# Patient Record
Sex: Male | Born: 1955 | Race: Black or African American | Hispanic: No | Marital: Married | Smoking: Never smoker
Health system: Southern US, Community
[De-identification: ages and names within clinical notes are randomized; demographics above are authoritative.]

## PROBLEM LIST (undated history)

## (undated) DIAGNOSIS — M1990 Unspecified osteoarthritis, unspecified site: Secondary | ICD-10-CM

## (undated) DIAGNOSIS — E119 Type 2 diabetes mellitus without complications: Secondary | ICD-10-CM

## (undated) DIAGNOSIS — R079 Chest pain, unspecified: Secondary | ICD-10-CM

## (undated) DIAGNOSIS — R110 Nausea: Secondary | ICD-10-CM

## (undated) DIAGNOSIS — E049 Nontoxic goiter, unspecified: Secondary | ICD-10-CM

## (undated) HISTORY — DX: Unspecified osteoarthritis, unspecified site: M19.90

## (undated) HISTORY — DX: Nausea: R11.0

## (undated) HISTORY — DX: Chest pain, unspecified: R07.9

---

## 1988-07-25 HISTORY — PX: THYROID SURGERY: SHX805

## 1996-07-25 HISTORY — PX: THYROID SURGERY: SHX805

## 2004-11-22 ENCOUNTER — Ambulatory Visit: Payer: Self-pay | Admitting: Nurse Practitioner

## 2004-11-22 ENCOUNTER — Ambulatory Visit: Payer: Self-pay | Admitting: *Deleted

## 2005-04-06 ENCOUNTER — Ambulatory Visit: Payer: Self-pay | Admitting: Internal Medicine

## 2005-07-12 ENCOUNTER — Ambulatory Visit: Payer: Self-pay | Admitting: Internal Medicine

## 2005-08-19 ENCOUNTER — Ambulatory Visit: Payer: Self-pay | Admitting: Family Medicine

## 2005-09-26 ENCOUNTER — Ambulatory Visit: Payer: Self-pay | Admitting: Internal Medicine

## 2005-11-17 ENCOUNTER — Ambulatory Visit: Payer: Self-pay | Admitting: Internal Medicine

## 2007-06-12 ENCOUNTER — Ambulatory Visit: Payer: Self-pay | Admitting: Family Medicine

## 2007-06-12 ENCOUNTER — Encounter (INDEPENDENT_AMBULATORY_CARE_PROVIDER_SITE_OTHER): Payer: Self-pay | Admitting: Nurse Practitioner

## 2007-06-12 LAB — CONVERTED CEMR LAB
BUN: 15 mg/dL (ref 6–23)
CO2: 24 meq/L (ref 19–32)
Calcium: 9.4 mg/dL (ref 8.4–10.5)
Chloride: 103 meq/L (ref 96–112)
Creatinine, Ser: 0.95 mg/dL (ref 0.40–1.50)

## 2007-06-15 ENCOUNTER — Ambulatory Visit (HOSPITAL_COMMUNITY): Admission: RE | Admit: 2007-06-15 | Discharge: 2007-06-15 | Payer: Self-pay | Admitting: Internal Medicine

## 2008-04-18 ENCOUNTER — Ambulatory Visit: Payer: Self-pay | Admitting: Internal Medicine

## 2009-02-22 ENCOUNTER — Encounter (INDEPENDENT_AMBULATORY_CARE_PROVIDER_SITE_OTHER): Payer: Self-pay | Admitting: Surgery

## 2009-02-22 ENCOUNTER — Inpatient Hospital Stay (HOSPITAL_COMMUNITY): Admission: EM | Admit: 2009-02-22 | Discharge: 2009-02-28 | Payer: Self-pay | Admitting: Emergency Medicine

## 2009-11-26 ENCOUNTER — Emergency Department (HOSPITAL_COMMUNITY): Admission: EM | Admit: 2009-11-26 | Discharge: 2009-11-26 | Payer: Self-pay | Admitting: Emergency Medicine

## 2010-10-31 ENCOUNTER — Inpatient Hospital Stay (INDEPENDENT_AMBULATORY_CARE_PROVIDER_SITE_OTHER)
Admission: RE | Admit: 2010-10-31 | Discharge: 2010-10-31 | Disposition: A | Discharge status: Home / Self Care | Payer: Medicaid Other | Source: Ambulatory Visit | Attending: Emergency Medicine | Admitting: Emergency Medicine

## 2010-10-31 DIAGNOSIS — R197 Diarrhea, unspecified: Secondary | ICD-10-CM

## 2010-10-31 LAB — COMPREHENSIVE METABOLIC PANEL
AST: 19 U/L (ref 0–37)
Alkaline Phosphatase: 70 U/L (ref 39–117)
CO2: 25 mEq/L (ref 19–32)
Glucose, Bld: 133 mg/dL — ABNORMAL HIGH (ref 70–99)
Total Protein: 7.5 g/dL (ref 6.0–8.3)

## 2010-10-31 LAB — POCT CARDIAC MARKERS: Myoglobin, poc: 83.9 ng/mL (ref 12–200)

## 2010-10-31 LAB — POCT URINALYSIS DIP (DEVICE)
Glucose, UA: NEGATIVE mg/dL
Hgb urine dipstick: NEGATIVE
Protein, ur: NEGATIVE mg/dL
Specific Gravity, Urine: 1.025 (ref 1.005–1.030)
Urobilinogen, UA: 0.2 mg/dL (ref 0.0–1.0)

## 2010-10-31 LAB — DIFFERENTIAL
Eosinophils Relative: 0 % (ref 0–5)
Lymphocytes Relative: 3 % — ABNORMAL LOW (ref 12–46)
Lymphs Abs: 0.3 10*3/uL — ABNORMAL LOW (ref 0.7–4.0)
Monocytes Absolute: 0.1 10*3/uL (ref 0.1–1.0)
Monocytes Relative: 1 % — ABNORMAL LOW (ref 3–12)
Neutrophils Relative %: 96 % — ABNORMAL HIGH (ref 43–77)

## 2010-10-31 LAB — CBC
HCT: 44.6 % (ref 39.0–52.0)
Hemoglobin: 15 g/dL (ref 13.0–17.0)
MCHC: 33.6 g/dL (ref 30.0–36.0)
MCV: 83.5 fL (ref 78.0–100.0)
RDW: 14.2 % (ref 11.5–15.5)
RDW: 14.7 % (ref 11.5–15.5)
WBC: 7.2 10*3/uL (ref 4.0–10.5)
WBC: 9.2 10*3/uL (ref 4.0–10.5)

## 2010-10-31 LAB — LACTIC ACID, PLASMA: Lactic Acid, Venous: 2.7 mmol/L — ABNORMAL HIGH (ref 0.5–2.2)

## 2010-10-31 LAB — PROTIME-INR
INR: 1.1 (ref 0.00–1.49)
Prothrombin Time: 14 seconds (ref 11.6–15.2)

## 2010-10-31 LAB — CULTURE, BLOOD (ROUTINE X 2)

## 2010-12-07 NOTE — Op Note (Signed)
Samuel [name], Samuel [name]                ACCOUNT NO.:  [id number]   MEDICAL RECORD NO.:  [id number]          PATIENT TYPE:  INP   LOCATION:  1826                         FACILITY:  [facility]   PHYSICIAN:  Thomas A. Cornett, M.D.DATE OF BIRTH:  Jan 17, 1956   DATE OF PROCEDURE:  02/22/2009  DATE OF DISCHARGE:                               OPERATIVE REPORT   PREOPERATIVE DIAGNOSIS:  Acute appendicitis.   POSTOPERATIVE DIAGNOSIS:  Perforated appendicitis with periappendiceal  abscess.   PROCEDURE:  Laparoscopic appendectomy.   SURGEON:  Maisie Fus A. Cornett, MD   ANESTHESIA:  General endotracheal anesthesia with 0.25% Sensorcaine  local.   ESTIMATED BLOOD LOSS:  30 mL.   SPECIMEN:  Appendiceal remnants to Pathology.   DRAIN:  19-Blake drain to right lower quadrant.   INDICATIONS FOR PROCEDURE:  The patient is a 55 year old male with a 2-  day history of abdominal pain.  CT scan showed appendicitis and he was  brought to the operating room for laparoscopic appendectomy.  There was  no evidence of perforation by CT scan or physical examination.   DESCRIPTION OF PROCEDURE:  The patient was brought to the operating room  and placed supine.  After induction of general anesthesia, the abdomen  was prepped and draped in a sterile fashion and Foley catheter was  placed.  A 1 cm supraumbilical incision was made.  Dissection was  carried down to fascia and the fascia was opened in the midline.  Pursestring suture of 0 Vicryl was placed and a 12-mm Hasson cannula was  placed under direct vision.  Pneumoperitoneum was created to 15 mmHg of  CO2 and a laparoscope was placed.  Upon examination, the appendix was in  the retrocecal position.  Two 5-mm ports were placed, one in the midline  just below the umbilicus and a second in the left lower quadrant.  The  appendix was identified in the tip.  The appendix had already blown out.  There was a large abscess associated with it.  There was not much that  of appendix left, but I was able to dissect around the base of the  appendix to get around this.  Using a 5-mm scope, I placed a GIA-45  stapling device rod across the base of what was left of the appendix and  a stapled across this.  I was then able to irrigate out the abscess  cavity and posterior wall of the colon, but there really was not much  more appendix I could get out.  There was a little bit of excess tissue  on either side of the staple line that I trimmed off and this was the  remainder of the appendix and I passed this off the field as specimen.  A drain was placed in the abscess cavity in the right lower quadrant  after irrigation was clear.  The drain was brought out through the lower  abdominal laparoscopic port site and secured the skin with 3-0 nylon.  This was placed to a bulb suction.  I then finished irrigating and  suctioning out all excess irrigation.  No evidence of  injury to small or  large bowel or other organs.  We then removed our ports under direct  vision and passed them off the field.  The umbilical port site was  closed with a pursestring 0 Vicryl  suture and 4-0 Monocryl was used to close the remainder of skin  incisions with Dermabond as a dressing.  All final counts of sponge,  needle, and instrument were found to be correct at this portion of the  case.  The patient was then awakened and taken to the recovery room in  satisfactory condition.      Thomas A. Cornett, M.D.  Electronically Signed     TAC/MEDQ  D:  02/22/2009  T:  02/23/2009  Job:  [id number]

## 2010-12-07 NOTE — Consult Note (Signed)
NAMEMANOAH, Samuel [name]                ACCOUNT NO.:  [id number]   MEDICAL RECORD NO.:  [id number]          PATIENT TYPE:  EMS   LOCATION:  MAJO                         FACILITY:  [facility]   PHYSICIAN:  Thomas A. Cornett, M.D.DATE OF BIRTH:  02/07/1956   DATE OF CONSULTATION:  02/22/2009  DATE OF DISCHARGE:                                 CONSULTATION   REQUESTING PHYSICIAN:  Dione Booze, MD   REASON FOR CONSULTATION:  Abdominal pain.   HISTORY OF PRESENT ILLNESS:  The patient is a 55 year old male with a 2-  day history of abdominal pain.  The pain started two days ago.  It is  located in his right lower quadrant.  The intensity is 6/10.  It is  crampy in nature without radiation.  It is made worse with movement and  made better lying still.  Currently, the pain is 0/10 with pain  medicine.  There is no associated nausea or vomiting.  There is low-  grade fever.  No back pain or dysuria.  CT scan was obtained which  showed acute appendicitis.  I was asked to see the patient at the  request of Dr. Preston Fleeting in consultation.   PAST MEDICAL HISTORY:  None.   PAST SURGICAL HISTORY:  None.   MEDICATIONS:  None.   ALLERGIES:  None.   SOCIAL HISTORY:  He is married.  He moved here from [place] 4 years ago  according to his wife.  His wife and son are along and they actually  help to translate some since his English is broken.  He denies tobacco  or alcohol use.   FAMILY HISTORY:  Noncontributory.   REVIEW OF SYSTEMS:  Positive for right lower quadrant pain.  Denies  nausea, vomiting, or chills.  Positive for fever.  Otherwise, 15-point  review of systems is negative.   PHYSICAL EXAMINATION:  VITAL SIGNS:  Temperature 101.2, heart rate 107,  blood pressure 90/48, and respirations 20.  GENERAL APPEARANCE:  Pleasant male in no apparent distress.  HEENT:  Extraocular movements are intact.  No evidence of jaundice.  Oropharynx is clear.  NECK:  Supple and nontender.  Trachea is midline.  No  adenopathy.  PULMONARY:  Lung sounds are clear bilaterally.  Chest wall motion is  normal.  CARDIOVASCULAR:  Regular rate rhythm without rub, murmur, or gallop.  EXTREMITIES:  Warm and well-perfused.  ABDOMEN:  Tender in right lower quadrant with positive rebound and  guarding.  No mass or hernia.  EXTREMITIES:  Muscle tone is normal.  Range of motion is normal.  NEUROLOGIC:  Glasgow coma scale is 15.  Motor and sensory function is  grossly intact.   DIAGNOSTIC STUDIES:  His white count is 9200, hemoglobin 15, and  platelet count 240,000.  Sodium 135, potassium 3.3, chloride 101, CO2  25, BUN 10, creatinine 0.96, and glucose 137.  An abdominal pelvic CT  showed acute appendicitis.  INR is 1.1.  Lactate is 2.7.  Lipase is 15.  His cardiac panel is within normal limits showing normal troponin I  which is of less than 0.05 and a  normal CK-MB.   IMPRESSION:  Acute appendicitis.   PLAN:  Recommend laparoscopic appendectomy to Samuel [name].  Alternatives  to therapy include IV antibiotics, but I think given the fact that this  is a really not perforated and he is young and he is relatively healthy,  I feel a surgical approach will be better and much faster for him to get  out of the hospital quicker.  Risk of bleeding, infection, and further  surgery were described.  Risk of surgery including bleeding, infection,  organ injury, injury to the abdominal wall, arteries, veins, and nerves.  Also the need for more surgery and prolonged hospitalization and  anesthesia risks.      Thomas A. Cornett, M.D.  Electronically Signed     TAC/MEDQ  D:  02/22/2009  T:  02/22/2009  Job:  [id number]   cc:   Dione Booze, MD

## 2010-12-10 NOTE — Discharge Summary (Signed)
Samuel [name], [name] NO.:  [id number]   MEDICAL RECORD NO.:  [id number]          PATIENT TYPE:  INP   LOCATION:  5159                         FACILITY:  [facility]   PHYSICIAN:  Gaynelle Adu, MD        DATE OF BIRTH:  1955-11-17   DATE OF ADMISSION:  02/22/2009  DATE OF DISCHARGE:  02/28/2009                               DISCHARGE SUMMARY   ATTENDING PHYSICIAN:  Maisie Fus A. Cornett, MD   DISCHARGING PHYSICIAN:  Gaynelle Adu, MD   CONSULTANTS:  None.   PROCEDURE:  Laparoscopic appendectomy with placement of a 19-French  Blake drain by Dr. Luisa Hart on February 22, 2009.   REASON FOR ADMISSION:  This is a 55 year old male with a 2-day history  of abdominal pain.  It is located in his right lower quadrant and is  crampy in nature without radiation.  The pain is worse with movement and  made better by lying still.  He has not had any associated nausea or  vomiting and has had a low-grade fever.  Because of the pain, the  patient came to the emergency department where he was found to have  acute appendicitis by CT scan.  Please see admitting history and  physical for further details.   ADMITTING DIAGNOSIS:  Acute appendicitis.   HOSPITAL COURSE:  The patient was admitted and placed on antibiotics.  He was then taken to the operating room where a laparoscopic  appendectomy was completed; however, the patient was found to have a  perforated appendix with an abscess.  After the procedure, a 19-French  Blake drain was left in place.  On postoperative day 1, the patient was  feeling better with no nausea or vomiting but had an increase in fevers  up to 102, and blood cultures were obtained.  By postoperative day 2,  the patient was feeling much better and passing flatus and tolerating a  regular diet; however, he was still febrile and wanted to stay for an  extra day for precautionary reasons.  On postoperative day 3, the  patient's blood cultures had returned and it was found  that the patient  had E. coli bacteremia.  Therefore, his Levoxine was discontinued and it  was changed to [employer].  At this time, the patient continued to spike  fevers despite feeling well with a soft, appropriately tender abdomen  and with only serous output from his JP drain.  His antibiotics were  then eventually changed again to Rocephin per the pharmacy.  By  postoperative day 6, the patient had essentially been afebrile for 24  hours, was having normal bowel movements and eating well with only  intermittent pain.  At this time, his JP drain was discontinued as there  was only serous output, and he was otherwise ready for discharge home.   DISCHARGE DIAGNOSES:  1. Acute perforated appendicitis.  2. Status post laparoscopic appendectomy with a JP drain that was      removed prior to discharge.  3. Escherichia coli bacteremia.   DISCHARGE MEDICATIONS:  1. Percocet 5/325 one to two p.o. q.4  h. p.r.n. pain.  2. Ceftin 500 mg b.i.d. x7 days.  Otherwise, he had no home      medications.   DISCHARGE INSTRUCTIONS:  The patient may resume a regular diet.  He may  increase his activity slowly and walk up steps.  He may shower; however,  he is not to take a bath for the next 2 weeks.  He is not to do any  heavy lifting greater than approximately 15 pounds for the next 2 weeks.  He is otherwise to return to the [facility] at our office on March 17, 2009, for a followup visit.  He is to call our office for fever greater  than 101.5 or worsening abdominal pain.      Letha Cape, PA      Gaynelle Adu, MD  Electronically Signed    KEO/MEDQ  D:  03/20/2009  T:  03/21/2009  Job:  [id number]   cc:   Maisie Fus A. Cornett, M.D.

## 2012-03-02 ENCOUNTER — Encounter (HOSPITAL_COMMUNITY): Payer: Self-pay | Admitting: Emergency Medicine

## 2012-03-02 ENCOUNTER — Emergency Department (HOSPITAL_COMMUNITY): Payer: Self-pay

## 2012-03-02 ENCOUNTER — Emergency Department (HOSPITAL_COMMUNITY)
Admission: EM | Admit: 2012-03-02 | Discharge: 2012-03-02 | Disposition: A | Discharge status: Home / Self Care | Payer: Self-pay | Attending: Emergency Medicine | Admitting: Emergency Medicine

## 2012-03-02 DIAGNOSIS — N509 Disorder of male genital organs, unspecified: Secondary | ICD-10-CM | POA: Insufficient documentation

## 2012-03-02 DIAGNOSIS — N201 Calculus of ureter: Secondary | ICD-10-CM | POA: Insufficient documentation

## 2012-03-02 LAB — URINALYSIS, ROUTINE W REFLEX MICROSCOPIC
Bilirubin Urine: NEGATIVE
Glucose, UA: NEGATIVE mg/dL
Leukocytes, UA: NEGATIVE
Nitrite: NEGATIVE
Urobilinogen, UA: 0.2 mg/dL (ref 0.0–1.0)

## 2012-03-02 LAB — BASIC METABOLIC PANEL
BUN: 12 mg/dL (ref 6–23)
CO2: 25 mEq/L (ref 19–32)
Calcium: 9.3 mg/dL (ref 8.4–10.5)
Chloride: 103 mEq/L (ref 96–112)
Creatinine, Ser: 0.91 mg/dL (ref 0.50–1.35)
Glucose, Bld: 159 mg/dL — ABNORMAL HIGH (ref 70–99)
Sodium: 138 mEq/L (ref 135–145)

## 2012-03-02 LAB — CBC
HCT: 44.7 % (ref 39.0–52.0)
MCH: 27.5 pg (ref 26.0–34.0)
Platelets: 248 10*3/uL (ref 150–400)
RBC: 5.45 MIL/uL (ref 4.22–5.81)

## 2012-03-02 LAB — URINE MICROSCOPIC-ADD ON

## 2012-03-02 MED ORDER — ONDANSETRON 8 MG PO TBDP: 8.0000 mg | ORAL_TABLET | Freq: Three times a day (TID) | ORAL | Status: AC | PRN

## 2012-03-02 MED ORDER — OXYCODONE-ACETAMINOPHEN 5-325 MG PO TABS: 1.0000 | ORAL_TABLET | Freq: Four times a day (QID) | ORAL | Status: AC | PRN

## 2012-03-02 MED ORDER — HYDROMORPHONE HCL PF 1 MG/ML IJ SOLN
1.0000 mg | Freq: Once | INTRAMUSCULAR | Status: AC
  Administered 2012-03-02: 1 mg via INTRAVENOUS
  Filled 2012-03-02: qty 1

## 2012-03-02 MED ORDER — ONDANSETRON HCL 4 MG/2ML IJ SOLN
4.0000 mg | Freq: Once | INTRAMUSCULAR | Status: AC
  Administered 2012-03-02: 4 mg via INTRAVENOUS
  Filled 2012-03-02: qty 2

## 2012-03-02 MED ORDER — SODIUM CHLORIDE 0.9 % IV SOLN
INTRAVENOUS | Status: DC
  Administered 2012-03-02: 14:00:00 via INTRAVENOUS

## 2012-03-02 NOTE — ED Notes (Signed)
Pt c/o bilateral testicle pain and trouble voiding starting today; pt denies any swelling

## 2012-03-02 NOTE — ED Notes (Signed)
Pt knows we need a urine sample and has a urinal at bedside

## 2012-03-02 NOTE — ED Notes (Signed)
Pt to CT scan.

## 2012-03-02 NOTE — ED Notes (Signed)
Pt presents with onset of testicular pain x 2 days that suddenly worsened today after having sexual intercourse with his wife.  Pt denied any pain during intercourse, reports immediately after, he went to void with onset of pain.  Pt denies any swelling to testicles, reports pain radiates into bilateral lower abdomen.  +nausea and vomiting

## 2012-03-02 NOTE — ED Provider Notes (Signed)
History     CSN: [id number]  Arrival date & time 03/02/12  1131   First MD Initiated Contact with Patient 03/02/12 1324      Chief Complaint  Patient presents with  . Groin Pain  . Testicle Pain    HPI Comments: The pain is in both testicles.  He has not taken any medications this morning.    Patient is a 56 y.o. male presenting with groin pain. The history is provided by the patient.  Groin Pain This is a new problem. Episode onset: this morning. The problem occurs constantly. The problem has not changed since onset.Associated symptoms comments: Urinary uregency. Exacerbated by: pt noticed it after having intercourse with his wife today. Nothing relieves the symptoms.   patient states the pain is severe in starts in his suprapubic region and radiates down to his testicles. He does have some urinary frequency. Initially felt the pain he felt like his testicles were going to fall off.  History reviewed. No pertinent past medical history.  History reviewed. No pertinent past surgical history.  History reviewed. No pertinent family history.  History  Substance Use Topics  . Smoking status: Never Smoker   . Smokeless tobacco: Not on file  . Alcohol Use: No      Review of Systems  Constitutional: Negative for fever.  Respiratory: Negative for cough.   Gastrointestinal: Positive for nausea and vomiting (once ).  Genitourinary: Positive for testicular pain.    Allergies  Review of patient's allergies indicates no known allergies.  Home Medications  No current outpatient prescriptions on file.  BP 153/73  Pulse 58  Temp 98.2 F (36.8 C) (Oral)  Resp 16  SpO2 97%  Physical Exam  Nursing note and vitals reviewed. Constitutional: He appears well-developed and well-nourished. No distress.  HENT:  Head: Normocephalic and atraumatic.  Right Ear: External ear normal.  Left Ear: External ear normal.  Eyes: Conjunctivae are normal. Right eye exhibits no discharge. Left  eye exhibits no discharge. No scleral icterus.  Neck: Neck supple. No tracheal deviation present.  Cardiovascular: Normal rate, regular rhythm and intact distal pulses.   Pulmonary/Chest: Effort normal and breath sounds normal. No stridor. No respiratory distress. He has no wheezes. He has no rales.  Abdominal: Soft. Bowel sounds are normal. He exhibits no distension. There is no tenderness. There is no rebound and no guarding. Hernia confirmed negative in the right inguinal area and confirmed negative in the left inguinal area.  Genitourinary: Testes normal and penis normal. Right testis shows no mass, no swelling and no tenderness. Left testis shows no mass, no swelling and no tenderness.  Musculoskeletal: He exhibits no edema and no tenderness.  Neurological: He is alert. He has normal strength. No sensory deficit. Cranial nerve deficit:  no gross defecits noted. He exhibits normal muscle tone. He displays no seizure activity. Coordination normal.  Skin: Skin is warm and dry. No rash noted.  Psychiatric: He has a normal mood and affect.    ED Course  Procedures (including critical care time)  Medications  0.9 %  sodium chloride infusion (  Intravenous New Bag/Given 03/02/12 1404)  oxyCODONE-acetaminophen (PERCOCET/ROXICET) 5-325 MG per tablet (not administered)  ondansetron (ZOFRAN ODT) 8 MG disintegrating tablet (not administered)  ondansetron (ZOFRAN) injection 4 mg (4 mg Intravenous Given 03/02/12 1404)  HYDROmorphone (DILAUDID) injection 1 mg (1 mg Intravenous Given 03/02/12 1407)    Labs Reviewed - No data to display US Scrotum  03/02/2012  *RADIOLOGY REPORT*  Clinical  Data:  Scrotal/groin pain  SCROTAL ULTRASOUND DOPPLER ULTRASOUND OF THE TESTICLES  Technique: Complete ultrasound examination of the testicles, epididymis, and other scrotal structures was performed.  Color and spectral Doppler ultrasound were also utilized to evaluate blood flow to the testicles.  Comparison:  None   Findings:  Right testis:  Normal in size and appearance, measuring 4.2 x 2.0 x 2.9 cm.  Left testis:  Normal in size and appearance, measuring 4.6 x 2.0 x 2.9 cm.  Right epididymis:  Normal in size and appearance.  Left epididymis:  Normal in size and appearance.  Hydrocele:  Small bilateral hydroceles.  Varicocele:  Absent.  Pulsed Doppler interrogation of both testes demonstrates low resistance flow bilaterally.  IMPRESSION: No evidence of testicular torsion.  Small bilateral hydroceles.  Original Report Authenticated By: Charline Bills, M.D.   [place] Art/ven Flow Abd Pelv Doppler  03/02/2012  *RADIOLOGY REPORT*  Clinical Data:  Scrotal/groin pain  SCROTAL ULTRASOUND DOPPLER ULTRASOUND OF THE TESTICLES  Technique: Complete ultrasound examination of the testicles, epididymis, and other scrotal structures was performed.  Color and spectral Doppler ultrasound were also utilized to evaluate blood flow to the testicles.  Comparison:  None  Findings:  Right testis:  Normal in size and appearance, measuring 4.2 x 2.0 x 2.9 cm.  Left testis:  Normal in size and appearance, measuring 4.6 x 2.0 x 2.9 cm.  Right epididymis:  Normal in size and appearance.  Left epididymis:  Normal in size and appearance.  Hydrocele:  Small bilateral hydroceles.  Varicocele:  Absent.  Pulsed Doppler interrogation of both testes demonstrates low resistance flow bilaterally.  IMPRESSION: No evidence of testicular torsion.  Small bilateral hydroceles.  Original Report Authenticated By: Charline Bills, M.D.     1. Right ureteral stone       MDM  Pt with a right ureteral stone as the source of his pain. His symptoms improved with treatment in emergency department. He'll be discharged home with a referral to allergy to be used as needed. Pain medications and antinausea medications have been prescribed        Celene Kras, MD 03/02/12 [phone]

## 2012-03-03 NOTE — ED Notes (Signed)
Pt/pharmacy states zofran too expensive; med changed to phenergan 25mg  po q6h prn, dispense 20 no refills per dr Rubin Payor; called to [facility] at [phone].

## 2012-07-25 HISTORY — PX: APPENDECTOMY: SHX54

## 2012-08-01 ENCOUNTER — Encounter (HOSPITAL_COMMUNITY): Payer: Self-pay | Admitting: *Deleted

## 2012-08-01 ENCOUNTER — Emergency Department (HOSPITAL_COMMUNITY)
Admission: EM | Admit: 2012-08-01 | Discharge: 2012-08-01 | Disposition: A | Discharge status: Home / Self Care | Payer: Self-pay | Attending: Emergency Medicine | Admitting: Emergency Medicine

## 2012-08-01 ENCOUNTER — Emergency Department (HOSPITAL_COMMUNITY): Payer: Self-pay

## 2012-08-01 DIAGNOSIS — R0789 Other chest pain: Secondary | ICD-10-CM | POA: Insufficient documentation

## 2012-08-01 DIAGNOSIS — R1013 Epigastric pain: Secondary | ICD-10-CM | POA: Insufficient documentation

## 2012-08-01 DIAGNOSIS — J029 Acute pharyngitis, unspecified: Secondary | ICD-10-CM | POA: Insufficient documentation

## 2012-08-01 DIAGNOSIS — B349 Viral infection, unspecified: Secondary | ICD-10-CM

## 2012-08-01 DIAGNOSIS — L299 Pruritus, unspecified: Secondary | ICD-10-CM | POA: Insufficient documentation

## 2012-08-01 DIAGNOSIS — B9789 Other viral agents as the cause of diseases classified elsewhere: Secondary | ICD-10-CM | POA: Insufficient documentation

## 2012-08-01 DIAGNOSIS — R509 Fever, unspecified: Secondary | ICD-10-CM | POA: Insufficient documentation

## 2012-08-01 LAB — POCT I-STAT, CHEM 8
BUN: 19 mg/dL (ref 6–23)
Calcium, Ion: 1.17 mmol/L (ref 1.12–1.23)
Chloride: 103 mEq/L (ref 96–112)
Glucose, Bld: 136 mg/dL — ABNORMAL HIGH (ref 70–99)

## 2012-08-01 LAB — CBC
Platelets: 271 10*3/uL (ref 150–400)
RDW: 13.9 % (ref 11.5–15.5)
WBC: 6.3 10*3/uL (ref 4.0–10.5)

## 2012-08-01 LAB — COMPREHENSIVE METABOLIC PANEL
AST: 22 U/L (ref 0–37)
Albumin: 3.8 g/dL (ref 3.5–5.2)
Alkaline Phosphatase: 73 U/L (ref 39–117)
Chloride: 102 mEq/L (ref 96–112)
Potassium: 4 mEq/L (ref 3.5–5.1)
Total Bilirubin: 0.7 mg/dL (ref 0.3–1.2)

## 2012-08-01 LAB — POCT I-STAT TROPONIN I: Troponin i, poc: 0 ng/mL (ref 0.00–0.08)

## 2012-08-01 MED ORDER — PREDNISONE 20 MG PO TABS
40.0000 mg | ORAL_TABLET | Freq: Once | ORAL | Status: AC
  Administered 2012-08-01: 40 mg via ORAL
  Filled 2012-08-01: qty 2

## 2012-08-01 MED ORDER — OMEPRAZOLE 20 MG PO CPDR: 20.0000 mg | DELAYED_RELEASE_CAPSULE | Freq: Every day | ORAL | Status: DC

## 2012-08-01 MED ORDER — DIPHENHYDRAMINE HCL 25 MG PO CAPS
25.0000 mg | ORAL_CAPSULE | Freq: Once | ORAL | Status: AC
  Administered 2012-08-01: 25 mg via ORAL
  Filled 2012-08-01: qty 1

## 2012-08-01 MED ORDER — GI COCKTAIL ~~LOC~~
30.0000 mL | Freq: Once | ORAL | Status: AC
  Administered 2012-08-01: 30 mL via ORAL
  Filled 2012-08-01: qty 30

## 2012-08-01 MED ORDER — SODIUM CHLORIDE 0.9 % IV BOLUS (SEPSIS): 1000.0000 mL | Freq: Once | INTRAVENOUS | Status: DC

## 2012-08-01 NOTE — ED Provider Notes (Signed)
History   This chart was scribed for non-physician practitioner working with Juliet Rude. Rubin Payor, MD by Gerlean Ren, ED Scribe. This patient was seen in room TR06C/TR06C and the patient's care was started at 10:03 PM.    CSN: [id number]  Arrival date & time 08/01/12  2132   First MD Initiated Contact with Patient 08/01/12 2149      Chief Complaint  Patient presents with  . Pruritis     The history is provided by the patient. No language interpreter was used.   Samuel [name] is a 57 y.o. male who presents to the Emergency Department complaining of constant itching all over body since being discharged here earlier today after presenting with chest pain and having chest pain workup with a GI cocktail.  Pt states chest pain is still present rated as 9/10.  Pt denies tobacco and alcohol use.   Patient's pain was relieved with the GI cocktail and fluids earlier today.  History reviewed. No pertinent past medical history.  History reviewed. No pertinent past surgical history.  History reviewed. No pertinent family history.  History  Substance Use Topics  . Smoking status: Never Smoker   . Smokeless tobacco: Not on file  . Alcohol Use: No      Review of Systems  Constitutional:       General Pruritus  Cardiovascular: Positive for chest pain.    Allergies  Review of patient's allergies indicates no known allergies.  Home Medications  No current outpatient prescriptions on file.  BP 120/74  Pulse 107  Temp 98 F (36.7 C) (Oral)  Resp 18  SpO2 98%  Physical Exam  Nursing note and vitals reviewed. Constitutional: He is oriented to person, place, and time. He appears well-developed and well-nourished. No distress.  HENT:  Head: Normocephalic and atraumatic.       Goiter present, airway intact  Eyes: EOM are normal.  Neck: Neck supple. No tracheal deviation present.  Cardiovascular: Normal rate, regular rhythm and normal heart sounds.  Exam reveals no gallop and no  friction rub.   No murmur heard. Pulmonary/Chest: Effort normal and breath sounds normal. No respiratory distress.  Musculoskeletal: Normal range of motion.  Neurological: He is alert and oriented to person, place, and time.  Skin: Skin is warm and dry. No rash noted.  Psychiatric: He has a normal mood and affect. His behavior is normal.    ED Course  Procedures (including critical care time) DIAGNOSTIC STUDIES: Oxygen Saturation is 98% on room air, normal by my interpretation.    COORDINATION OF CARE: 10:06 PM- Patient informed of clinical course, understands medical decision-making process, and agrees with plan.  Results for orders placed during the hospital encounter of 08/01/12  POCT I-STAT, CHEM 8      Component Value Range   Sodium 141  135 - 145 mEq/L   Potassium 4.3  3.5 - 5.1 mEq/L   Chloride 103  96 - 112 mEq/L   BUN 19  6 - 23 mg/dL   Creatinine, Ser 6.57  0.50 - 1.35 mg/dL   Glucose, Bld 846 (*) 70 - 99 mg/dL   Calcium, Ion 9.62  9.52 - 1.23 mmol/L   TCO2 31  0 - 100 mmol/L   Hemoglobin 17.0  13.0 - 17.0 g/dL   HCT 84.1  32.4 - 40.1 %  POCT I-STAT TROPONIN I      Component Value Range   Troponin i, poc 0.00  0.00 - 0.08 ng/mL   Comment 3  Dg Chest 2 View  08/01/2012  *RADIOLOGY REPORT*  Clinical Data: Cough and chest pain.  CHEST - 2 VIEW  Comparison: None.  Findings: Lungs are clear.  Heart size is normal.  No pneumothorax or pleural fluid.  IMPRESSION: No acute disease.   Original Report Authenticated By: Holley Dexter, M.D.       1. Pruritus       MDM  This is a 57 year old male, who presents emergency department with chief complaint. This. Patient was seen here earlier today for chest pain, and had a negative cardiac workup. I have discussed this patient with Dr. Rubin Payor, and will order an additional troponin and chest x-ray and EKG.  11:48 PM Patient is improved and no longer itching.  Repeated workup is negative for cardiac in  etiology, will treat the patient's pruritus with prednisone and Benadryl. The patient is stable and ready for discharge. Return precautions have been given. Patient understands and agrees with the plan.  I personally performed the services described in this documentation, which was scribed in my presence. The recorded information has been reviewed and is accurate.  Filed Vitals:   08/01/12 2349  BP: 108/70  Pulse: 91  Temp: 97.8 F (36.6 C)  Resp: [address], [place] 08/01/12 2353

## 2012-08-01 NOTE — ED Notes (Signed)
Pt states he was seen here earlier for CP.  Came back due to itching all over body.

## 2012-08-01 NOTE — ED Notes (Signed)
Reports having a sore throat, then went to intermittnet chest pains and now into his abd. Having nausea, no vomiting or diarrhea. No acute distress noted at triage.

## 2012-08-01 NOTE — ED Provider Notes (Signed)
History     CSN: [id number]  Arrival date & time 08/01/12  1310   First MD Initiated Contact with Patient 08/01/12 1356      Chief Complaint  Patient presents with  . Chest Pain  . Sore Throat  . Abdominal Pain    (Consider location/radiation/quality/duration/timing/severity/associated sxs/prior treatment) HPI Complains of sore throat, chest pain, and epigastric pain onset 3 days ago constant. Unchanging. Pain is moderate not made better or worse by anything. Nonexertional. Associated symptoms include slight cough, subjective fever, lightheadedness with standing and diarrhea 2 days ago which has since resolved. No treatment prior to coming here no other complaint History reviewed. No pertinent past medical history. Past medical history goiter History reviewed. No pertinent past surgical history. Surgical history appendectomy, neck surgery History reviewed. No pertinent family history.  History  Substance Use Topics  . Smoking status: Never Smoker   . Smokeless tobacco: Not on file  . Alcohol Use: No      Review of Systems  Constitutional: Positive for fever.  HENT: Positive for sore throat.   Cardiovascular: Positive for chest pain.  Gastrointestinal: Positive for abdominal pain.    Allergies  Review of patient's allergies indicates no known allergies.  Home Medications  No current outpatient prescriptions on file.  BP 125/76  Pulse 91  Temp 99.5 F (37.5 C) (Oral)  Resp 20  SpO2 97%  Physical Exam  Nursing note and vitals reviewed. Constitutional: He appears well-developed and well-nourished. No distress.  HENT:  Head: Normocephalic and atraumatic.       Mucous membranes dry  Eyes: Conjunctivae normal are normal. Pupils are equal, round, and reactive to light.  Neck: Neck supple. No tracheal deviation present. No thyromegaly present.       Goiter present  Cardiovascular: Normal rate and regular rhythm.   No murmur heard. Pulmonary/Chest: Effort normal  and breath sounds normal.  Abdominal: Soft. Bowel sounds are normal. He exhibits no distension. There is no tenderness.  Musculoskeletal: Normal range of motion. He exhibits no edema and no tenderness.  Neurological: He is alert. Coordination normal.  Skin: Skin is warm and dry. No rash noted.  Psychiatric: He has a normal mood and affect.    Date: 08/01/2012  Rate: 90  Rhythm: normal sinus rhythm  QRS Axis: normal  Intervals: normal  ST/T Wave abnormalities: nonspecific T wave changes  Conduction Disutrbances:none  Narrative Interpretation:   Old EKG Reviewed: none available  ED Course  Procedures (including critical care time)   Labs Reviewed  CBC  COMPREHENSIVE METABOLIC PANEL  URINALYSIS, MICROSCOPIC ONLY   No results found. Results for orders placed during the hospital encounter of 08/01/12  CBC      Component Value Range   WBC 6.3  4.0 - 10.5 K/uL   RBC 5.75  4.22 - 5.81 MIL/uL   Hemoglobin 15.8  13.0 - 17.0 g/dL   HCT 78.2  95.6 - 21.3 %   MCV 82.3  78.0 - 100.0 fL   MCH 27.5  26.0 - 34.0 pg   MCHC 33.4  30.0 - 36.0 g/dL   RDW 08.6  57.8 - 46.9 %   Platelets 271  150 - 400 K/uL  COMPREHENSIVE METABOLIC PANEL      Component Value Range   Sodium 139  135 - 145 mEq/L   Potassium 4.0  3.5 - 5.1 mEq/L   Chloride 102  96 - 112 mEq/L   CO2 29  19 - 32 mEq/L   Glucose, Bld  106 (*) 70 - 99 mg/dL   BUN 14  6 - 23 mg/dL   Creatinine, Ser 0.98  0.50 - 1.35 mg/dL   Calcium 9.7  8.4 - 11.9 mg/dL   Total Protein 7.9  6.0 - 8.3 g/dL   Albumin 3.8  3.5 - 5.2 g/dL   AST 22  0 - 37 U/L   ALT 30  0 - 53 U/L   Alkaline Phosphatase 73  39 - 117 U/L   Total Bilirubin 0.7  0.3 - 1.2 mg/dL   GFR calc non Af Amer >90  >90 mL/min   GFR calc Af Amer >90  >90 mL/min  POCT I-STAT TROPONIN I      Component Value Range   Troponin i, poc 0.00  0.00 - 0.08 ng/mL   Comment 3            No results found.   No diagnosis found. Patient asymptomatic after treatment with GI cocktail  and IV fluids  MDM   I do not feel pain is cardiac in etiology i.e. symptoms coming by diarrhea and cough Symptoms most likely viral in etiology Plan referral resource guide Diagnosis viral illness       Doug Sou, MD 08/01/12 1605

## 2012-08-02 NOTE — ED Provider Notes (Signed)
Medical screening examination/treatment/procedure(s) were performed by non-physician practitioner and as supervising physician I was immediately available for consultation/collaboration.  Rankin Coolman R. Lanyia Jewel, MD 08/02/12 2357 

## 2012-11-14 ENCOUNTER — Emergency Department (HOSPITAL_COMMUNITY)
Admission: EM | Admit: 2012-11-14 | Discharge: 2012-11-14 | Disposition: A | Discharge status: Home / Self Care | Payer: Self-pay | Attending: Emergency Medicine | Admitting: Emergency Medicine

## 2012-11-14 ENCOUNTER — Encounter (HOSPITAL_COMMUNITY): Payer: Self-pay | Admitting: Emergency Medicine

## 2012-11-14 DIAGNOSIS — Z79899 Other long term (current) drug therapy: Secondary | ICD-10-CM | POA: Insufficient documentation

## 2012-11-14 DIAGNOSIS — E049 Nontoxic goiter, unspecified: Secondary | ICD-10-CM | POA: Insufficient documentation

## 2012-11-14 HISTORY — DX: Nontoxic goiter, unspecified: E04.9

## 2012-11-14 LAB — BASIC METABOLIC PANEL
Chloride: 105 mEq/L (ref 96–112)
Creatinine, Ser: 0.95 mg/dL (ref 0.50–1.35)
GFR calc Af Amer: >90 mL/min (ref >90)
Potassium: 4.4 mEq/L (ref 3.5–5.1)

## 2012-11-14 LAB — TSH: TSH: 0.619 u[IU]/mL (ref 0.350–4.500)

## 2012-11-14 LAB — T3, FREE: T3, Free: 3.4 pg/mL (ref 2.3–4.2)

## 2012-11-14 NOTE — ED Notes (Signed)
Patient states he has goiter that "gets big sometimes".

## 2012-11-14 NOTE — ED Notes (Signed)
Pt sts hx of goiter x 16 years that sometimes enlarges; pt sts getting larger x 3-4 weeks and want to have checked; pt denies trouble swallowing, eating of SOB

## 2012-11-16 NOTE — ED Provider Notes (Signed)
History     CSN: [id number]  Arrival date & time 11/14/12  1110   First MD Initiated Contact with Patient 11/14/12 1249      Chief Complaint  Patient presents with  . Goiter    (Consider location/radiation/quality/duration/timing/severity/associated sxs/prior treatment) HPI Comments: Samuel [name] is a 57 y.o. Male presenting with an enlarging goiter over the past 4 weeks.  He reports a history of goiter when in his home country of [place] which was surgically removed.  He has been symptom free until the past 4 weeks when he has noticed swelling once again.  He denies pain or difficulty swallowing.  He describes a "fullness" sensation that radiates into his upper chest.  He denies palpitations, sweats or chills, fatigue, insomnia, unexplained weight gain or loss, chest pain, shortness of breath, and has had no polydipsia or polyphagia. He has not established medical care since moving here.     The history is provided by the patient and the spouse.    Past Medical History  Diagnosis Date  . Goiter     History reviewed. No pertinent past surgical history.  History reviewed. No pertinent family history.  History  Substance Use Topics  . Smoking status: Never Smoker   . Smokeless tobacco: Not on file  . Alcohol Use: No      Review of Systems  Constitutional: Negative for fever.  HENT: Negative for congestion, sore throat, neck pain and neck stiffness.   Eyes: Negative.   Respiratory: Negative for chest tightness and shortness of breath.   Cardiovascular: Negative for chest pain.  Gastrointestinal: Negative for nausea and abdominal pain.  Endocrine: Negative for cold intolerance, heat intolerance, polydipsia and polyphagia.  Genitourinary: Negative.   Musculoskeletal: Negative for joint swelling and arthralgias.  Skin: Negative.  Negative for rash and wound.  Neurological: Negative for dizziness, weakness, light-headedness, numbness and headaches.  Psychiatric/Behavioral:  Negative.     Allergies  Review of patient's allergies indicates no known allergies.  Home Medications   Current Outpatient Rx  Name  Route  Sig  Dispense  Refill  . omeprazole (PRILOSEC) 20 MG capsule   Oral   Take 1 capsule (20 mg total) by mouth daily.   30 capsule   0     BP 112/69  Pulse 70  Temp(Src) 98.2 F (36.8 C) (Oral)  Resp 20  SpO2 97%  Physical Exam  Nursing note and vitals reviewed. Constitutional: He appears well-developed and well-nourished.  HENT:  Head: Normocephalic and atraumatic.  Eyes: Conjunctivae are normal.  Neck: Normal range of motion. Neck supple. Mass present.  Firm,  But moveable walnut sized thyroid mass.  Nontender.  Cardiovascular: Normal rate, regular rhythm, normal heart sounds and intact distal pulses.   Pulmonary/Chest: Effort normal and breath sounds normal. He has no wheezes.  Abdominal: Soft. Bowel sounds are normal. There is no tenderness.  Musculoskeletal: Normal range of motion.  Neurological: He is alert.  Skin: Skin is warm and dry.  Psychiatric: He has a normal mood and affect.    ED Course  Procedures (including critical care time)  Labs Reviewed  T4, FREE  T3, FREE  TSH  BASIC METABOLIC PANEL   No results found.   1. Goiter       MDM  Discussed with Dr. Estell Harpin prior to dc home.  Pt was referred to Dr. Annalee Genta,  ent for further care.  Pt will need imaging,  Will defer to Dr. Annalee Genta for this.  Thyroid panel obtained today.  Pt understands plan and will call for appt with ent.  Also given referral numbers for obtaining pcp.        Burgess Amor, PA-C 11/16/12 1407

## 2012-11-20 NOTE — ED Provider Notes (Signed)
Medical screening examination/treatment/procedure(s) were performed by non-physician practitioner and as supervising physician I was immediately available for consultation/collaboration.   Benny Lennert, MD 11/20/12 [phone]

## 2012-11-21 ENCOUNTER — Other Ambulatory Visit: Payer: Self-pay | Admitting: Otolaryngology

## 2012-11-21 DIAGNOSIS — E041 Nontoxic single thyroid nodule: Secondary | ICD-10-CM

## 2012-11-21 DIAGNOSIS — D449 Neoplasm of uncertain behavior of unspecified endocrine gland: Secondary | ICD-10-CM

## 2012-11-26 ENCOUNTER — Ambulatory Visit
Admission: RE | Admit: 2012-11-26 | Discharge: 2012-11-26 | Disposition: A | Discharge status: Home / Self Care | Payer: No Typology Code available for payment source | Source: Ambulatory Visit | Attending: Otolaryngology | Admitting: Otolaryngology

## 2012-11-26 DIAGNOSIS — E041 Nontoxic single thyroid nodule: Secondary | ICD-10-CM

## 2012-11-26 DIAGNOSIS — D449 Neoplasm of uncertain behavior of unspecified endocrine gland: Secondary | ICD-10-CM

## 2012-11-26 MED ORDER — IOHEXOL 300 MG/ML  SOLN
75.0000 mL | Freq: Once | INTRAMUSCULAR | Status: AC | PRN
  Administered 2012-11-26: 75 mL via INTRAVENOUS

## 2013-07-19 ENCOUNTER — Emergency Department (HOSPITAL_COMMUNITY)
Admission: EM | Admit: 2013-07-19 | Discharge: 2013-07-19 | Disposition: A | Discharge status: Home / Self Care | Payer: No Typology Code available for payment source | Attending: Emergency Medicine | Admitting: Emergency Medicine

## 2013-07-19 ENCOUNTER — Emergency Department (HOSPITAL_COMMUNITY): Payer: No Typology Code available for payment source

## 2013-07-19 ENCOUNTER — Encounter (HOSPITAL_COMMUNITY): Payer: Self-pay | Admitting: Emergency Medicine

## 2013-07-19 DIAGNOSIS — R5381 Other malaise: Secondary | ICD-10-CM | POA: Insufficient documentation

## 2013-07-19 DIAGNOSIS — Z8639 Personal history of other endocrine, nutritional and metabolic disease: Secondary | ICD-10-CM | POA: Insufficient documentation

## 2013-07-19 DIAGNOSIS — B9789 Other viral agents as the cause of diseases classified elsewhere: Secondary | ICD-10-CM | POA: Insufficient documentation

## 2013-07-19 DIAGNOSIS — B349 Viral infection, unspecified: Secondary | ICD-10-CM

## 2013-07-19 DIAGNOSIS — R509 Fever, unspecified: Secondary | ICD-10-CM | POA: Insufficient documentation

## 2013-07-19 DIAGNOSIS — K089 Disorder of teeth and supporting structures, unspecified: Secondary | ICD-10-CM | POA: Insufficient documentation

## 2013-07-19 DIAGNOSIS — J3489 Other specified disorders of nose and nasal sinuses: Secondary | ICD-10-CM | POA: Insufficient documentation

## 2013-07-19 DIAGNOSIS — K0889 Other specified disorders of teeth and supporting structures: Secondary | ICD-10-CM

## 2013-07-19 DIAGNOSIS — Z862 Personal history of diseases of the blood and blood-forming organs and certain disorders involving the immune mechanism: Secondary | ICD-10-CM | POA: Insufficient documentation

## 2013-07-19 LAB — RAPID STREP SCREEN (MED CTR MEBANE ONLY): Streptococcus, Group A Screen (Direct): NEGATIVE

## 2013-07-19 LAB — INFLUENZA PANEL BY PCR (TYPE A & B)
H1N1 flu by pcr: NOT DETECTED
Influenza B By PCR: NEGATIVE

## 2013-07-19 MED ORDER — OSELTAMIVIR PHOSPHATE 75 MG PO CAPS: 75.0000 mg | ORAL_CAPSULE | Freq: Two times a day (BID) | ORAL | Status: DC

## 2013-07-19 MED ORDER — ACETAMINOPHEN 325 MG PO TABS
650.0000 mg | ORAL_TABLET | Freq: Once | ORAL | Status: AC
  Administered 2013-07-19: 650 mg via ORAL
  Filled 2013-07-19: qty 2

## 2013-07-19 MED ORDER — PENICILLIN V POTASSIUM 500 MG PO TABS: 500.0000 mg | ORAL_TABLET | Freq: Three times a day (TID) | ORAL | Status: DC

## 2013-07-19 NOTE — ED Provider Notes (Signed)
CSN: [id number]     Arrival date & time 07/19/13  1002 History  This chart was scribed for non-physician practitioner Adah Salvage working with Layla Maw Ward, DO by Joaquin Music, ED Scribe. This patient was seen in room TR09C/TR09C and the patient's care was started at  1:23 PM.   Chief Complaint  Patient presents with  . URI   The history is provided by the patient. No language interpreter was used.   HPI Comments: Samuel [name] is a 57 y.o. male who presents to the Emergency Department complaining of ongoing cough and subjective fever with associated fatigue that began 2 days ago. He states when he coughs, he has some colorless sputum. Pt denies any sick contact. Pt denies any recent travel. Pt denies SOB.  Pt also complains of bleeding from tooth on R lower . He states he has been prescribed medication in the past but is unsure what he has been given. Pt states this problem has been reoccurring. He states the bleeding stopped for about 2-3 year. Pt denies pain in the tooth.  Pt states he is other wise healthy.  Past Medical History  Diagnosis Date  . Goiter    History reviewed. No pertinent past surgical history. History reviewed. No pertinent family history. History  Substance Use Topics  . Smoking status: Never Smoker   . Smokeless tobacco: Not on file  . Alcohol Use: No    Review of Systems  Constitutional: Positive for fever, chills and fatigue.  HENT: Positive for congestion and dental problem.   Respiratory: Positive for cough. Negative for shortness of breath.   Gastrointestinal: Negative for nausea, vomiting and diarrhea.    Allergies  Review of patient's allergies indicates no known allergies.  Home Medications  No current outpatient prescriptions on file.  BP 114/79  Pulse 99  Temp(Src) 101.1 F (38.4 C) (Oral)  Resp 22  Ht 6\' 2"  (1.88 m)  Wt 227 lb (102.967 kg)  BMI 29.13 kg/m2  SpO2 95%  Physical Exam  Nursing note  reviewed. Constitutional: He appears well-developed and well-nourished. No distress.  HENT:  Head: Normocephalic and atraumatic.  Nose: Mucosal edema and rhinorrhea present.  Mouth/Throat: Mucous membranes are not dry. No trismus in the jaw. Posterior oropharyngeal erythema present. No oropharyngeal exudate.  No obvious infection of Left lower molar.  No blood identified.  Eyes: Right eye exhibits no discharge. Left eye exhibits no discharge.  Cardiovascular: Normal rate, regular rhythm and normal heart sounds.   No murmur heard. Pulmonary/Chest: Effort normal and breath sounds normal. He has no wheezes.  Abdominal: Soft.    ED Course  Procedures DIAGNOSTIC STUDIES: Oxygen Saturation is 95% on RA, normal by my interpretation.    COORDINATION OF CARE: 1:29 PM-Discussed treatment plan which includes CXR. Pt agreed to plan.   Labs Review Labs Reviewed  RAPID STREP SCREEN  CULTURE, GROUP A STREP  INFLUENZA PANEL BY PCR   Imaging Review Dg Chest 2 View  07/19/2013   CLINICAL DATA:  Shortness of breath, upper respiratory infection  EXAM: CHEST  2 VIEW  COMPARISON:  08/01/2012  FINDINGS: Normal heart size, mediastinal contours, and pulmonary vascularity.  Lungs slightly hyperinflated with minimal subsegmental atelectasis at the costophrenic angles.  No acute infiltrate, pleural effusion or pneumothorax.  Bones unremarkable.  IMPRESSION: Minimal bibasilar atelectasis and hyperinflation.   Electronically Signed   By: Ulyses Southward M.D.   On: 07/19/2013 13:58    EKG Interpretation   None  MDM   1. Viral illness   2. Pain, dental    Pt with likely viral illness, most likely flu febrile in ED.  C-XR clear. Tylenol given. Flu results not resulted by the time of discharge but based on symptoms greater than 48 hours tamiflu is not indicated. Supportive care and encouraged fluids. Discussed lab results, imaging results, and treatment plan with the patient. Return precautions given.  Reports understanding and no other concerns at this time.  Patient is stable for discharge at this time.  Meds given in ED:  Medications  acetaminophen (TYLENOL) tablet 650 mg (650 mg Oral Given 07/19/13 1148)    Discharge Medication List as of 07/19/2013  2:33 PM    START taking these medications   Details  penicillin v potassium (VEETID) 500 MG tablet Take 1 tablet (500 mg total) by mouth 3 (three) times daily., Starting 07/19/2013, Until Discontinued, Print       I personally performed the services described in this documentation, which was scribed in my presence. The recorded information has been reviewed and is accurate.     Clabe Seal, PA-C 07/21/13 2156

## 2013-07-19 NOTE — ED Notes (Signed)
Per pt sts fever, sore throat, congestion and runny nose.

## 2013-07-21 LAB — CULTURE, GROUP A STREP

## 2013-07-22 NOTE — ED Provider Notes (Signed)
Medical screening examination/treatment/procedure(s) were performed by non-physician practitioner and as supervising physician I was immediately available for consultation/collaboration.  EKG Interpretation   None         Layla Maw Trella Thurmond, DO 07/22/13 [phone]

## 2014-01-16 ENCOUNTER — Encounter (HOSPITAL_COMMUNITY): Payer: Self-pay | Admitting: Emergency Medicine

## 2014-01-16 ENCOUNTER — Emergency Department (HOSPITAL_COMMUNITY)
Admission: EM | Admit: 2014-01-16 | Discharge: 2014-01-17 | Disposition: A | Discharge status: Home / Self Care | Payer: No Typology Code available for payment source | Attending: Emergency Medicine | Admitting: Emergency Medicine

## 2014-01-16 DIAGNOSIS — Z792 Long term (current) use of antibiotics: Secondary | ICD-10-CM | POA: Insufficient documentation

## 2014-01-16 DIAGNOSIS — Z79899 Other long term (current) drug therapy: Secondary | ICD-10-CM | POA: Insufficient documentation

## 2014-01-16 DIAGNOSIS — E049 Nontoxic goiter, unspecified: Secondary | ICD-10-CM | POA: Insufficient documentation

## 2014-01-16 LAB — COMPREHENSIVE METABOLIC PANEL
ALT: 26 U/L (ref 0–53)
AST: 25 U/L (ref 0–37)
Albumin: 3.9 g/dL (ref 3.5–5.2)
Alkaline Phosphatase: 69 U/L (ref 39–117)
BUN: 12 mg/dL (ref 6–23)
CALCIUM: 9.3 mg/dL (ref 8.4–10.5)
CO2: 25 meq/L (ref 19–32)
CREATININE: 0.81 mg/dL (ref 0.50–1.35)
Chloride: 101 mEq/L (ref 96–112)
GLUCOSE: 102 mg/dL — AB (ref 70–99)
Potassium: 4.4 mEq/L (ref 3.7–5.3)
SODIUM: 141 meq/L (ref 137–147)
TOTAL PROTEIN: 7.9 g/dL (ref 6.0–8.3)
Total Bilirubin: 0.6 mg/dL (ref 0.3–1.2)

## 2014-01-16 LAB — CBC WITH DIFFERENTIAL/PLATELET
Basophils Absolute: 0 10*3/uL (ref 0.0–0.1)
Basophils Relative: 0 % (ref 0–1)
EOS ABS: 0.1 10*3/uL (ref 0.0–0.7)
EOS PCT: 2 % (ref 0–5)
HEMATOCRIT: 41.9 % (ref 39.0–52.0)
Hemoglobin: 14 g/dL (ref 13.0–17.0)
LYMPHS ABS: 2 10*3/uL (ref 0.7–4.0)
LYMPHS PCT: 34 % (ref 12–46)
MCH: 27.6 pg (ref 26.0–34.0)
MCHC: 33.4 g/dL (ref 30.0–36.0)
MCV: 82.5 fL (ref 78.0–100.0)
MONO ABS: 0.9 10*3/uL (ref 0.1–1.0)
Monocytes Relative: 15 % — ABNORMAL HIGH (ref 3–12)
Neutro Abs: 3 10*3/uL (ref 1.7–7.7)
Neutrophils Relative %: 49 % (ref 43–77)
PLATELETS: 247 10*3/uL (ref 150–400)
RBC: 5.08 MIL/uL (ref 4.22–5.81)
RDW: 13.6 % (ref 11.5–15.5)
WBC: 5.9 10*3/uL (ref 4.0–10.5)

## 2014-01-16 NOTE — ED Notes (Signed)
Pt reports thyroid problems since the 60's. Pt had a partial thyroidectomy in 81, and since then was supposed to be taking medication for his thyroid. Pt has not had his medication since 2009. Pt states that he has had this goiter for 2 weeks, but has gotten more painful over the last 2 days. Pt reports pain with swallowing.

## 2014-01-16 NOTE — ED Notes (Signed)
Pt. reports thyroid swelling onset last night with generalized weakness/malaise , pt. stated he is fasting ( Ramadan ) with history of thyroid disease, airway intact / respirations unlabored.

## 2014-01-17 ENCOUNTER — Emergency Department (HOSPITAL_COMMUNITY): Payer: No Typology Code available for payment source

## 2014-01-17 LAB — TSH: TSH: 0.595 u[IU]/mL (ref 0.350–4.500)

## 2014-01-17 LAB — T3, FREE: T3 FREE: 3.1 pg/mL (ref 2.3–4.2)

## 2014-01-17 MED ORDER — NAPROXEN 500 MG PO TABS: 500.0000 mg | ORAL_TABLET | Freq: Two times a day (BID) | ORAL | Status: DC

## 2014-01-17 MED ORDER — OXYCODONE-ACETAMINOPHEN 5-325 MG PO TABS
1.0000 | ORAL_TABLET | Freq: Once | ORAL | Status: AC
  Administered 2014-01-17: 1 via ORAL
  Filled 2014-01-17: qty 1

## 2014-01-17 NOTE — ED Provider Notes (Signed)
CSN: [id number]     Arrival date & time 01/16/14  1910 History   First MD Initiated Contact with Patient 01/16/14 2343     Chief Complaint  Patient presents with  . 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 Pt states the area has been enlarged over the past month and even more over the past 3 days.  Pain with swallowing. No difficulty breathing or swallowing. No fever/chills.  Pt states he has not been taking medication for this since 2008.  Pt states symptoms have been increased due to his fasting for Ramadan.  No palpitations, no changes in weight.  He has seen Dr. 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  Pt has not followed up with ENT since that time. There are no other associated systemic symptoms, there are no other alleviating or modifying factors.   Past Medical History  Diagnosis Date  . Goiter    History reviewed. No pertinent past surgical history. No family history on file. History  Substance Use Topics  . Smoking status: Never Smoker   . Smokeless tobacco: Not on file  . Alcohol Use: No    Review of Systems ROS reviewed and all otherwise negative except for mentioned in HPI    Allergies  Review of patient's allergies indicates no known allergies.  Home Medications   Prior to Admission medications   Medication Sig Start Date End Date Taking? Authorizing Provider  naproxen (NAPROSYN) 500 MG tablet Take 1 tablet (500 mg total) by mouth 2 (two) times daily. 01/17/14   Threasa Beards, MD  oseltamivir (TAMIFLU) 75 MG capsule Take 1 capsule (75 mg total) by mouth every 12 (twelve) hours. 07/19/13   Lauren Burnetta Sabin, PA-C  penicillin v potassium (VEETID) 500 MG tablet Take 1 tablet (500 mg total) by mouth 3  (three) times daily. 07/19/13   Lauren Burnetta Sabin, PA-C   BP 130/77  Pulse 73  Temp(Src) 98.2 F (36.8 C) (Oral)  Resp 15  Ht 6\' 2"  (1.88 m)  Wt 219 lb (99.338 kg)  BMI 28.11 kg/m2  SpO2 97% Vitals reviewed Physical Exam Physical Examination: General appearance - alert, well appearing, and in no distress Mental status - alert, oriented to person, place, and time Eyes - no conjunctival injection, no scleral icterus Mouth - mucous membranes moist, pharynx normal without lesions Neck - supple, no significant adenopathy, thryoid swelling/goiter, tenderness, no fluctuance or overlying erythema Chest - clear to auscultation, no wheezes, rales or rhonchi, symmetric air entry Heart - normal rate, regular rhythm, normal S1, S2, no murmurs, rubs, clicks or gallops Abdomen - soft, nontender, nondistended, no masses or organomegaly Extremities - peripheral pulses normal, no pedal edema, no clubbing or cyanosis Skin - normal coloration and turgor, no rashes  ED Course  Procedures (including critical care time) Labs Review Labs Reviewed  CBC WITH DIFFERENTIAL - Abnormal; Notable for the following:    Monocytes Relative 15 (*)    All other components within normal limits  COMPREHENSIVE METABOLIC PANEL - Abnormal; Notable for the following:    Glucose, Bld 102 (*)    All other components within normal limits  TSH  T3, FREE    Imaging Review No results found.   EKG Interpretation None      MDM   Final diagnoses:  Goiter    Pt presenting with c/o enlarged thyroid assoicated with pain.  Labs reveal normal TSH.  Pt does not have tachycardia, weight loss or other acute symptoms of thyroid disease.  No fever or elevation in WBC to suggest infectious thryoiditis.  Will treat with naproxen and advised for ENT followup- he has seen Dr. Wilburn Cornelia in the past.  Discharged with strict return precautions.  Pt agreeable with plan.    Threasa Beards, MD 01/20/14 [phone]

## 2014-01-17 NOTE — Discharge Instructions (Signed)
Return to the ED with any concerns including fever, heart racing or palpitations, difficulty breathing or swallowing, decreased level of alertness/lethargy, or any other alarming symptoms   Emergency Department Resource Guide 1) Find a Doctor and Pay Out of Pocket Although you won't have to find out who is covered by your insurance plan, it is a good idea to ask around and get recommendations. You will then need to call the office and see if the doctor you have chosen will accept you as a new patient and what types of options they offer for patients who are self-pay. Some doctors offer discounts or will set up payment plans for their patients who do not have insurance, but you will need to ask so you aren't surprised when you get to your appointment.  2) Contact Your Local Health Department Not all health departments have doctors that can see patients for sick visits, but many do, so it is worth a call to see if yours does. If you don't know where your local health department is, you can check in your phone book. The CDC also has a tool to help you locate your state's health department, and many state websites also have listings of all of their local health departments.  3) Find a [facility] If your illness is not likely to be very severe or complicated, you may want to try a walk in clinic. These are popping up all over the country in pharmacies, drugstores, and shopping centers. They're usually staffed by nurse practitioners or physician assistants that have been trained to treat common illnesses and complaints. They're usually fairly quick and inexpensive. However, if you have serious medical issues or chronic medical problems, these are probably not your best option.  No Primary Care Doctor: - Call Health Connect at  [phone] - they can help you locate a primary care doctor that  accepts your insurance, provides certain services, etc. - Physician Referral Service- [phone]  Chronic  Pain Problems: Organization         Address  Phone   Notes  [facility]  [phone] Patients need to be referred by their primary care doctor.   Medication Assistance: Organization         Address  Phone   Notes  [facility] Medication Health [address] [place], [place] [address] [phone] --Must be a resident of [facility] -- Must have NO insurance coverage whatsoever (no Medicaid/ Medicare, etc.) -- The pt. MUST have a primary care doctor that directs their care regularly and follows them in the community   Med[facility]ist  [phone]   [employer]  [phone]    Agencies that provide inexpensive medical care: Organization         Address  Phone   Notes  [place]  [phone]   [facility]    [phone]   [facility] [place], [place] [address] [phone]   [place] [address], [place] [phone]   Planned Parenthood    [phone]   [facility]    [phone]   [place] and [place] [address], [place] Phone:  [phone], Fax:  [phone] Hours of Operation:  9 am - 6 pm, M-F.  Also accepts Medicaid/Medicare and self-pay.  [facility] for [place] [address], [address],  [place] Phone: [phone], Fax: [phone]. Hours of Operation:  8:30 am - 5:30 pm, M-F.  Also accepts Medicaid and self-pay.  [facility] [place] [address] [place] Phone: [phone]   [place], [place], [place] [phone], Ext. [phone] Mondays & Thursdays: 7-9 AM.  First 15 patients are seen on a first come, first serve basis.    [place] Providers:  Organization         Address  Phone   Notes  [facility] [address],  [phone] Also  accepts self-pay patients.  [facility] [address], [place]  [phone]   [place], [address], [place] [phone]   [facility] [address] [place] [phone]   Lucianne Lei [address], [place]   [phone] Only accepts [place] Access [place] patients after they have their name applied to their card.   Self-Pay (no insurance) in [facility]:  Organization         Address  Phone   Notes  Sickle Cell Patients, [facility] [place] [phone]   [facility] Care [place] [phone]   [facility] [place]  [place], [place], [place] [phone]   [facility]/Dr. Osei-Bonsu  [address], [place] or [address], [place] [phone] Phone number for both [place] and [place] locations is the same.  Urgent Medical and [facility] [address] [place] [phone]   [facility] [address], [place] or [address] [phone]   [facility] [address], [place] [phone], phone; [phone], fax Sees patients 1st and 3rd Saturday of every month.  Must not qualify for public or private insurance (i.e. Medicaid, Medicare, [place] Health Choice, Veterans' Benefits)  Household income should be no more than 200% of the poverty level The clinic cannot treat you if you are pregnant or think you are pregnant  Sexually transmitted diseases are not treated at the clinic.    Dental Care: Organization         Address  Phone  Notes  [facility] [place] [phone] Accepts children up to age 62 who are enrolled in [place] or [place]; pregnant  women with a Medicaid card; and children who have applied for Medicaid or [place] Health Choice, but were declined, whose parents can pay a reduced fee at time of service.  [facility]  [address], [place] [phone] Accepts children up to age 60 who are enrolled in [place] or [place]; pregnant women with a Medicaid card; and children who have applied for Medicaid or [place] Health Choice, but were declined, whose parents can pay a reduced fee at time of service.  [facility]  [place] [phone] Patients are seen by appointment only. Walk-ins are not accepted. [place] will see patients 15 years of age and older. Monday - Tuesday (8am-5pm) Most Wednesdays (8:30-5pm) $30 per visit, cash only  [place]  [place]  [address], [facility] [phone] Patients are seen by appointment only. Walk-ins are not accepted. [place] will see patients 69 years of age and older. One Wednesday Evening (Monthly: Volunteer Based).  $30 per visit, cash only  [place]  [phone] for adults; Children under age 84, call Graduate Pediatric Dentistry at [phone]. Children aged 22-14, please call [phone] to request a pediatric application.  Dental services are provided in all areas of dental care including fillings, crowns and bridges, complete and partial dentures, implants, gum treatment, root canals, and extractions. Preventive care is also provided. Treatment is provided to both adults and children. Patients are selected via a lottery and there is often a waiting list.   [facility] [address] [place]  [phone] [email or web address]   [facility] [address] [place], [place] [phone], Ext. [phone] Second and Fourth Thursday of each month, opens at 6:30 AM; Clinic ends at 9 AM.  Patients are  seen on a first-come first-served basis, and a limited number are seen during each clinic.   [facility]  [address] [place] [place], [place] [phone]   Eligibility Requirements You must have lived in [place], [place], or [place] counties for at least the last three months.   You cannot be eligible for state or federal sponsored [employer], including [employer], [place], or [employer].   You generally cannot be eligible for healthcare insurance through your employer.    How to apply: Eligibility screenings are held every Tuesday and Wednesday afternoon from 1:00 pm until 4:00 pm. You do not need an appointment for the interview!  [facility] [address] [place], [place]   [place]  [place] Department  [place]  [phone]    Behavioral Health Resources in the Community: Intensive Outpatient Programs Organization         Address  Phone  Notes  [place]. [address], [place], [place] [phone]   [facility] Outpatient [address], [place], [place]   ADS: Alcohol & Drug [facility] [address] [place], [place]   [place] [address]  [place], [place] or [phone]   Substance Abuse Resources Organization         Address  Phone  Notes  Alcohol and Drug Services  [phone]   [place]  [phone]   [facility] [place]   Chinita Pester  [phone]   Residential & Outpatient Substance Abuse Program  [phone]   Psychological Services Organization         Address  Phone  Notes  [facility] [place]  [place]  [phone]   [place] [address], [place] or [phone]    Mobile Crisis  Teams Organization         Address  Phone  Notes  [facility], Mobile Crisis Care Unit  [phone]   [facility]  [address] [place], [place]   [place] [address] [place] [phone]    Self-Help/Support Groups Organization         Address  Phone             Notes  [place]. of [place] - variety  of support groups  [phone]- [phone] Call for more information  Narcotics Anonymous (NA), [facility] [address], [employer] [place]  2 meetings at this location   Residential Facilities manager         Address  Phone  Notes  [facility]idential Treatment [place],    [place]  [phone]   [facility]  [address] [place] 735329, [place], [place]   [place], [place] [phone] Admissions: 8am-3pm M-F  Incentives Substance [place] [address],    [place], [place] [phone]   [facility] [address] [place], [place], [place]   [facility] [address],  [place], [place]   [facility]ight Programs - Intensive Outpatient [address]een [address], [place], [place]   [facility] [place].,  [place], [place] [phone] or [phone]   [facility]idential Treatment Services (RTS) [address] [place], [place] Accepts Medicaid  [facility] [place] [address],  [place] [phone] Substance Abuse/Addiction Treatment   [facility] Organization         Address  Phone  Notes  [facility]  [phone]   Domenic Schwab, PhD [address] [place]lis [place], [place]   [phone] or [phone]   [place], [place] [phone]   [facility] [address] [place], [place] [phone]  Insurance/Medicaid/sponsorship through [facility] and Families [address] [place]                                    [place], [place] [phone] [place] [address], [place] [phone]    Dr. Adele Schilder  [phone]   [facility] Dept. 1) [address] [place] 2) [place] 3)  [address], [place] [phone]  [phone]   [place] [phone] or [phone] (After Hours)

## 2014-02-04 ENCOUNTER — Ambulatory Visit: Payer: No Typology Code available for payment source | Admitting: Internal Medicine

## 2014-02-26 ENCOUNTER — Encounter: Payer: Self-pay | Admitting: Internal Medicine

## 2014-02-26 ENCOUNTER — Ambulatory Visit: Payer: No Typology Code available for payment source | Attending: Internal Medicine | Admitting: Internal Medicine

## 2014-02-26 VITALS — BP 105/71 | HR 70 | Temp 98.7°F | Resp 16 | Ht 73.0 in | Wt 220.0 lb

## 2014-02-26 DIAGNOSIS — Z1211 Encounter for screening for malignant neoplasm of colon: Secondary | ICD-10-CM

## 2014-02-26 DIAGNOSIS — R7301 Impaired fasting glucose: Secondary | ICD-10-CM | POA: Insufficient documentation

## 2014-02-26 DIAGNOSIS — Z139 Encounter for screening, unspecified: Secondary | ICD-10-CM

## 2014-02-26 DIAGNOSIS — E042 Nontoxic multinodular goiter: Secondary | ICD-10-CM

## 2014-02-26 NOTE — Patient Instructions (Signed)
Diabetes Mellitus and Food It is important for you to manage your blood sugar (glucose) level. Your blood glucose level can be greatly affected by what you eat. Eating healthier foods in the appropriate amounts throughout the day at about the same time each day will help you control your blood glucose level. It can also help slow or prevent worsening of your diabetes mellitus. Healthy eating may even help you improve the level of your blood pressure and reach or maintain a healthy weight.  HOW CAN FOOD AFFECT ME? Carbohydrates Carbohydrates affect your blood glucose level more than any other type of food. Your dietitian will help you determine how many carbohydrates to eat at each meal and teach you how to count carbohydrates. Counting carbohydrates is important to keep your blood glucose at a healthy level, especially if you are using insulin or taking certain medicines for diabetes mellitus. Alcohol Alcohol can cause sudden decreases in blood glucose (hypoglycemia), especially if you use insulin or take certain medicines for diabetes mellitus. Hypoglycemia can be a life-threatening condition. Symptoms of hypoglycemia (sleepiness, dizziness, and disorientation) are similar to symptoms of having too much alcohol.  If your health care provider has given you approval to drink alcohol, do so in moderation and use the following guidelines:  Women should not have more than one drink per day, and men should not have more than two drinks per day. One drink is equal to:  12 oz of beer.  5 oz of wine.  1 oz of hard liquor.  Do not drink on an empty stomach.  Keep yourself hydrated. Have water, diet soda, or unsweetened iced tea.  Regular soda, juice, and other mixers might contain a lot of carbohydrates and should be counted. WHAT FOODS ARE NOT RECOMMENDED? As you make food choices, it is important to remember that all foods are not the same. Some foods have fewer nutrients per serving than other  foods, even though they might have the same number of calories or carbohydrates. It is difficult to get your body what it needs when you eat foods with fewer nutrients. Examples of foods that you should avoid that are high in calories and carbohydrates but low in nutrients include:  Trans fats (most processed foods list trans fats on the Nutrition Facts label).  Regular soda.  Juice.  Candy.  Sweets, such as cake, pie, doughnuts, and cookies.  Fried foods. WHAT FOODS CAN I EAT? Have nutrient-rich foods, which will nourish your body and keep you healthy. The food you should eat also will depend on several factors, including:  The calories you need.  The medicines you take.  Your weight.  Your blood glucose level.  Your blood pressure level.  Your cholesterol level. You also should eat a variety of foods, including:  Protein, such as meat, poultry, fish, tofu, nuts, and seeds (lean animal proteins are best).  Fruits.  Vegetables.  Dairy products, such as milk, cheese, and yogurt (low fat is best).  Breads, grains, pasta, cereal, rice, and beans.  Fats such as olive oil, trans fat-free margarine, canola oil, avocado, and olives. DOES EVERYONE WITH DIABETES MELLITUS HAVE THE SAME MEAL PLAN? Because every person with diabetes mellitus is different, there is not one meal plan that works for everyone. It is very important that you meet with a dietitian who will help you create a meal plan that is just right for you. Document Released: 04/07/2005 Document Revised: 07/16/2013 Document Reviewed: 06/07/2013 ExitCare Patient Information 2015 [employer]. This   information is not intended to replace advice given to you by your health care provider. Make sure you discuss any questions you have with your health care provider.  

## 2014-02-26 NOTE — Progress Notes (Signed)
Patient Demographics  Loyal Holzheimer, is a 58 y.o. male  UUV:[id number]  KVQ:[id number]  DOB - 1956-04-04  CC:  Chief Complaint  Patient presents with  . Establish Care       HPI: Rhylan Kagel is a 58 y.o. male here today to establish medical care. He has to of multinodular goiter, several years ago patient had some thyroid surgery done, recently also went to the emergency room for painful swelling in the neck, patient was treated with naproxen, symptomatically improved as per patient he is following up with ENT, currently denies any acute symptoms his TSH level was within normal range. Patient has No headache, No chest pain, No abdominal pain - No Nausea, No new weakness tingling or numbness, No Cough - SOB.  No Known Allergies Past Medical History  Diagnosis Date  . Goiter    Current Outpatient Prescriptions on File Prior to Visit  Medication Sig Dispense Refill  . naproxen (NAPROSYN) 500 MG tablet Take 1 tablet (500 mg total) by mouth 2 (two) times daily.  30 tablet  0  . oseltamivir (TAMIFLU) 75 MG capsule Take 1 capsule (75 mg total) by mouth every 12 (twelve) hours.  10 capsule  0  . penicillin v potassium (VEETID) 500 MG tablet Take 1 tablet (500 mg total) by mouth 3 (three) times daily.  30 tablet  0   No current facility-administered medications on file prior to visit.   History reviewed. No pertinent family history. History   Social History  . Marital Status: Married    Spouse Name: N/A    Number of Children: N/A  . Years of Education: N/A   Occupational History  . Not on file.   Social History Main Topics  . Smoking status: Never Smoker   . Smokeless tobacco: Not on file  . Alcohol Use: No  . Drug Use: No  . Sexual Activity: Not on file   Other Topics Concern  . Not on file   Social History Narrative  . No narrative on file    Review of Systems: Constitutional: Negative for fever, chills, diaphoresis, activity change, appetite change and  fatigue. HENT: Negative for ear pain, nosebleeds, congestion, facial swelling, rhinorrhea, neck pain, neck stiffness and ear discharge.  Eyes: Negative for pain, discharge, redness, itching and visual disturbance. Respiratory: Negative for cough, choking, chest tightness, shortness of breath, wheezing and stridor.  Cardiovascular: Negative for chest pain, palpitations and leg swelling. Gastrointestinal: Negative for abdominal distention. Genitourinary: Negative for dysuria, urgency, frequency, hematuria, flank pain, decreased urine volume, difficulty urinating and dyspareunia.  Musculoskeletal: Negative for back pain, joint swelling, arthralgia and gait problem. Neurological: Negative for dizziness, tremors, seizures, syncope, facial asymmetry, speech difficulty, weakness, light-headedness, numbness and headaches.  Hematological: Negative for adenopathy. Does not bruise/bleed easily. Psychiatric/Behavioral: Negative for hallucinations, behavioral problems, confusion, dysphoric mood, decreased concentration and agitation.    Objective:   Filed Vitals:   02/26/14 1616  BP: 105/71  Pulse: 70  Temp: 98.7 F (37.1 C)  Resp: 16    Physical Exam: Constitutional: Patient appears well-developed and well-nourished. No distress. HENT: Normocephalic, atraumatic, External right and left ear normal. Oropharynx is clear and moist.  Eyes: Conjunctivae and EOM are normal. PERRLA, no scleral icterus. Neck: Normal ROM. Neck supple. No JVD. No tracheal deviation. + thyromegaly. CVS: RRR, S1/S2 +, no murmurs, no gallops, no carotid bruit.  Pulmonary: Effort and breath sounds normal, no stridor, rhonchi, wheezes, rales.  Abdominal: Soft. BS +, no distension, tenderness,  rebound or guarding.  Musculoskeletal: Normal range of motion. No edema and no tenderness.  Neuro: Alert. Normal reflexes, muscle tone coordination. No cranial nerve deficit. Skin: Skin is warm and dry. No rash noted. Not diaphoretic. No  erythema. No pallor. Psychiatric: Normal mood and affect. Behavior, judgment, thought content normal.  Lab Results  Component Value Date   WBC 5.9 01/16/2014   HGB 14.0 01/16/2014   HCT 41.9 01/16/2014   MCV 82.5 01/16/2014   PLT 247 01/16/2014   Lab Results  Component Value Date   CREATININE 0.81 01/16/2014   BUN 12 01/16/2014   NA 141 01/16/2014   K 4.4 01/16/2014   CL 101 01/16/2014   CO2 25 01/16/2014    No results found for this basename: HGBA1C   Lipid Panel  No results found for this basename: chol,  trig,  hdl,  cholhdl,  vldl,  ldlcalc       Assessment and plan:   1. Multinodular goiter Patient following up with ENT. Last TSH level was normal.  2. Screening We'll do baseline fasting blood work - Lipid panel; Future - Vit D  25 hydroxy (rtn osteoporosis monitoring); Future  3. Special screening for malignant neoplasms, colon  - Ambulatory referral to Gastroenterology  4. IFG (impaired fasting glucose) Advised patient for low carbohydrate diet, we'll check fasting blood chemistry. - COMPLETE METABOLIC PANEL WITH GFR; Future   Health Maintenance -Colonoscopy: referred to GI  Return in about 3 months (around 05/29/2014) for IFG.    Lorayne Marek, MD

## 2014-02-26 NOTE — Progress Notes (Signed)
Pt is here to establish care. Pt has a history of thyroid disorder and goiters.

## 2014-03-05 ENCOUNTER — Other Ambulatory Visit: Payer: No Typology Code available for payment source

## 2014-03-12 ENCOUNTER — Ambulatory Visit: Payer: No Typology Code available for payment source | Attending: Internal Medicine

## 2014-03-12 DIAGNOSIS — Z139 Encounter for screening, unspecified: Secondary | ICD-10-CM

## 2014-03-12 DIAGNOSIS — R7301 Impaired fasting glucose: Secondary | ICD-10-CM

## 2014-03-12 LAB — LIPID PANEL
CHOL/HDL RATIO: 3.4 ratio
Cholesterol: 155 mg/dL (ref 0–200)
HDL: 45 mg/dL (ref >39)
LDL CALC: 92 mg/dL (ref 0–99)
Triglycerides: 90 mg/dL (ref <150)
VLDL: 18 mg/dL (ref 0–40)

## 2014-03-12 LAB — COMPLETE METABOLIC PANEL WITH GFR
ALBUMIN: 4 g/dL (ref 3.5–5.2)
ALK PHOS: 72 U/L (ref 39–117)
ALT: 17 U/L (ref 0–53)
AST: 21 U/L (ref 0–37)
BILIRUBIN TOTAL: 0.7 mg/dL (ref 0.2–1.2)
BUN: 17 mg/dL (ref 6–23)
CO2: 27 mEq/L (ref 19–32)
Calcium: 8.8 mg/dL (ref 8.4–10.5)
Chloride: 106 mEq/L (ref 96–112)
Creat: 0.73 mg/dL (ref 0.50–1.35)
GFR, Est African American: >89 mL/min
Glucose, Bld: 102 mg/dL — ABNORMAL HIGH (ref 70–99)
POTASSIUM: 4.4 meq/L (ref 3.5–5.3)
SODIUM: 141 meq/L (ref 135–145)
TOTAL PROTEIN: 7.2 g/dL (ref 6.0–8.3)

## 2014-03-13 ENCOUNTER — Telehealth: Payer: Self-pay

## 2014-03-13 LAB — VITAMIN D 25 HYDROXY (VIT D DEFICIENCY, FRACTURES): VIT D 25 HYDROXY: 23 ng/mL — AB (ref 30–89)

## 2014-03-13 MED ORDER — VITAMIN D (ERGOCALCIFEROL) 1.25 MG (50000 UNIT) PO CAPS: 50000.0000 [IU] | ORAL_CAPSULE | ORAL | Status: DC

## 2014-03-13 NOTE — Telephone Encounter (Signed)
Message copied by Dorothe Pea on Thu Mar 13, 2014 12:29 PM ------      Message from: Lorayne Marek      Created: Thu Mar 13, 2014 11:32 AM       Blood work reviewed, noticed low vitamin D, call patient advise to start ergocalciferol 50,000 units once a week for the duration of  12 weeks.       noticed impaired fasting glucose, call and advise patient for low carbohydrate diet.       ------

## 2014-03-13 NOTE — Telephone Encounter (Signed)
Interpreter line used Patient not available Message left on voice mail to return our call 

## 2014-03-20 ENCOUNTER — Ambulatory Visit: Payer: No Typology Code available for payment source | Attending: Internal Medicine | Admitting: Internal Medicine

## 2014-03-20 ENCOUNTER — Encounter: Payer: Self-pay | Admitting: Internal Medicine

## 2014-03-20 VITALS — BP 122/76 | HR 73 | Temp 99.3°F | Resp 16 | Wt 217.2 lb

## 2014-03-20 DIAGNOSIS — E049 Nontoxic goiter, unspecified: Secondary | ICD-10-CM | POA: Insufficient documentation

## 2014-03-20 DIAGNOSIS — E559 Vitamin D deficiency, unspecified: Secondary | ICD-10-CM | POA: Insufficient documentation

## 2014-03-20 DIAGNOSIS — R7301 Impaired fasting glucose: Secondary | ICD-10-CM

## 2014-03-20 DIAGNOSIS — K055 Other periodontal diseases: Secondary | ICD-10-CM

## 2014-03-20 DIAGNOSIS — K029 Dental caries, unspecified: Secondary | ICD-10-CM

## 2014-03-20 DIAGNOSIS — K068 Other specified disorders of gingiva and edentulous alveolar ridge: Secondary | ICD-10-CM

## 2014-03-20 MED ORDER — VITAMIN D (ERGOCALCIFEROL) 1.25 MG (50000 UNIT) PO CAPS: 50000.0000 [IU] | ORAL_CAPSULE | ORAL | Status: DC

## 2014-03-20 NOTE — Progress Notes (Signed)
Patient complains of having bleeding gums for quite a while Requesting a referral to the dentist

## 2014-03-20 NOTE — Progress Notes (Signed)
MRN: [id number] Name: Samuel [name]  Sex: male Age: 58 y.o. DOB: Dec 06, 1955  Allergies: Review of patient's allergies indicates no known allergies.  Chief Complaint  Patient presents with  . Referral    HPI: Patient is 58 y.o. male who comes today reported to have bleeding gums and dental cavities, denies any pain, patient is requesting referral to see a dentist several years ago he has seen one, denies any fever chills chest and shortness of breath, patient had a blood work done which was reviewed with the patient noticed vitamin D deficiency and impaired fasting glucose.  Past Medical History  Diagnosis Date  . Goiter     Past Surgical History  Procedure Laterality Date  . Appendectomy    . Thyroid surgery        Medication List       This list is accurate as of: 03/20/14 12:46 PM.  Always use your most recent med list.               naproxen 500 MG tablet  Commonly known as:  NAPROSYN  Take 1 tablet (500 mg total) by mouth 2 (two) times daily.     oseltamivir 75 MG capsule  Commonly known as:  TAMIFLU  Take 1 capsule (75 mg total) by mouth every 12 (twelve) hours.     penicillin v potassium 500 MG tablet  Commonly known as:  VEETID  Take 1 tablet (500 mg total) by mouth 3 (three) times daily.     Vitamin D (Ergocalciferol) 50000 UNITS Caps capsule  Commonly known as:  DRISDOL  Take 1 capsule (50,000 Units total) by mouth every 7 (seven) days.        Meds ordered this encounter  Medications  . Vitamin D, Ergocalciferol, (DRISDOL) 50000 UNITS CAPS capsule    Sig: Take 1 capsule (50,000 Units total) by mouth every 7 (seven) days.    Dispense:  12 capsule    Refill:  0     There is no immunization history on file for this patient.  History reviewed. No pertinent family history.  History  Substance Use Topics  . Smoking status: Never Smoker   . Smokeless tobacco: Not on file  . Alcohol Use: No    Review of Systems   As noted in HPI  Filed  Vitals:   03/20/14 1218  BP: 122/76  Pulse: 73  Temp: 99.3 F (37.4 C)  Resp: 16    Physical Exam  Physical Exam  HENT:  Dental cavities  Eyes: EOM are normal.  Cardiovascular: Normal rate.   Pulmonary/Chest: Breath sounds normal. No respiratory distress. He has no wheezes. He has no rales.  Musculoskeletal: He exhibits no edema.    CBC    Component Value Date/Time   WBC 5.9 01/16/2014 1935   RBC 5.08 01/16/2014 1935   HGB 14.0 01/16/2014 1935   HCT 41.9 01/16/2014 1935   PLT 247 01/16/2014 1935   MCV 82.5 01/16/2014 1935   LYMPHSABS 2.0 01/16/2014 1935   MONOABS 0.9 01/16/2014 1935   EOSABS 0.1 01/16/2014 1935   BASOSABS 0.0 01/16/2014 1935    CMP     Component Value Date/Time   NA 141 03/12/2014 0916   K 4.4 03/12/2014 0916   CL 106 03/12/2014 0916   CO2 27 03/12/2014 0916   GLUCOSE 102* 03/12/2014 0916   BUN 17 03/12/2014 0916   CREATININE 0.73 03/12/2014 0916   CREATININE 0.81 01/16/2014 1935   CALCIUM 8.8 03/12/2014 0916  PROT 7.2 03/12/2014 0916   ALBUMIN 4.0 03/12/2014 0916   AST 21 03/12/2014 0916   ALT 17 03/12/2014 0916   ALKPHOS 72 03/12/2014 0916   BILITOT 0.7 03/12/2014 0916   GFRNONAA >89 03/12/2014 0916   GFRNONAA >90 01/16/2014 1935   GFRAA >89 03/12/2014 0916   GFRAA >90 01/16/2014 1935    Lab Results  Component Value Date/Time   CHOL 155 03/12/2014  9:16 AM    No components found with this basename: hga1c    Lab Results  Component Value Date/Time   AST 21 03/12/2014  9:16 AM    Assessment and Plan  IFG (impaired fasting glucose) Advise patient for low carbohydrate diet.  Unspecified vitamin D deficiency - Plan: Started patient on Vitamin D, Ergocalciferol, (DRISDOL) 50000 UNITS CAPS capsule  Bleeding gums /Dental cavities- Plan: Ambulatory referral to Dentistry  Return in about 3 months (around 06/20/2014) for IFG.  Lorayne Marek, MD

## 2014-03-21 ENCOUNTER — Emergency Department (HOSPITAL_COMMUNITY): Payer: No Typology Code available for payment source

## 2014-03-21 ENCOUNTER — Emergency Department (HOSPITAL_COMMUNITY)
Admission: EM | Admit: 2014-03-21 | Discharge: 2014-03-21 | Disposition: A | Discharge status: Home / Self Care | Payer: No Typology Code available for payment source | Attending: Emergency Medicine | Admitting: Emergency Medicine

## 2014-03-21 DIAGNOSIS — Z862 Personal history of diseases of the blood and blood-forming organs and certain disorders involving the immune mechanism: Secondary | ICD-10-CM | POA: Insufficient documentation

## 2014-03-21 DIAGNOSIS — Z8639 Personal history of other endocrine, nutritional and metabolic disease: Secondary | ICD-10-CM | POA: Insufficient documentation

## 2014-03-21 DIAGNOSIS — Z79899 Other long term (current) drug therapy: Secondary | ICD-10-CM | POA: Insufficient documentation

## 2014-03-21 DIAGNOSIS — S90122A Contusion of left lesser toe(s) without damage to nail, initial encounter: Secondary | ICD-10-CM

## 2014-03-21 DIAGNOSIS — W208XXA Other cause of strike by thrown, projected or falling object, initial encounter: Secondary | ICD-10-CM | POA: Insufficient documentation

## 2014-03-21 DIAGNOSIS — Y9289 Other specified places as the place of occurrence of the external cause: Secondary | ICD-10-CM | POA: Insufficient documentation

## 2014-03-21 DIAGNOSIS — S99929A Unspecified injury of unspecified foot, initial encounter: Secondary | ICD-10-CM

## 2014-03-21 DIAGNOSIS — S8990XA Unspecified injury of unspecified lower leg, initial encounter: Secondary | ICD-10-CM | POA: Insufficient documentation

## 2014-03-21 DIAGNOSIS — Y9389 Activity, other specified: Secondary | ICD-10-CM | POA: Insufficient documentation

## 2014-03-21 DIAGNOSIS — IMO0002 Reserved for concepts with insufficient information to code with codable children: Secondary | ICD-10-CM

## 2014-03-21 DIAGNOSIS — S99919A Unspecified injury of unspecified ankle, initial encounter: Secondary | ICD-10-CM

## 2014-03-21 DIAGNOSIS — S90129A Contusion of unspecified lesser toe(s) without damage to nail, initial encounter: Secondary | ICD-10-CM | POA: Insufficient documentation

## 2014-03-21 MED ORDER — IBUPROFEN 400 MG PO TABS
600.0000 mg | ORAL_TABLET | Freq: Once | ORAL | Status: AC
  Administered 2014-03-21: 600 mg via ORAL
  Filled 2014-03-21 (×2): qty 1

## 2014-03-21 MED ORDER — IBUPROFEN 600 MG PO TABS: 600.0000 mg | ORAL_TABLET | Freq: Four times a day (QID) | ORAL | Status: DC | PRN

## 2014-03-21 NOTE — Discharge Instructions (Signed)
Your x-ray does not show any fracture of the toe or foot.  A superficial abrasion/laceration at the base of the nail.  This does not need suturing.  Please keep this area clean and dry with soap and water

## 2014-03-21 NOTE — ED Notes (Signed)
Patient arrived POV after he dropped a case of unknown item/weight on his left foot. He has swelling to his big toe and he has some dried blood around his toe nail. Pain 5/10.

## 2014-03-21 NOTE — ED Provider Notes (Signed)
CSN: [id number]     Arrival date & time 03/21/14  2202 History   First MD Initiated Contact with Patient 03/21/14 0407     Chief Complaint  Patient presents with  . Foot Injury     (Consider location/radiation/quality/duration/timing/severity/associated sxs/prior Treatment) HPI Comments: Patient states he was talking, when he dropped a case of crackers, on his left foot.  He noticed, when he got him that there was a small laceration to the base of the nail of his left great, toe, with some swelling, then turned around and came on medially to the emergency department for evaluation with her taking any medication for discomfort  Patient is a 58 y.o. male presenting with foot injury. The history is provided by the patient.  Foot Injury Location:  Foot and toe Injury: yes   Foot location:  L foot Toe location:  L big toe Pain details:    Quality:  Aching   Radiates to:  Does not radiate   Severity:  Moderate   Onset quality:  Sudden   Timing:  Constant   Progression:  Unchanged Chronicity:  New Dislocation: no   Foreign body present:  No foreign bodies Tetanus status:  Unknown Prior injury to area:  No Relieved by:  None tried Worsened by:  Activity Ineffective treatments:  None tried Associated symptoms: swelling   Associated symptoms: no fever and no numbness     Past Medical History  Diagnosis Date  . Goiter    Past Surgical History  Procedure Laterality Date  . Appendectomy    . Thyroid surgery     No family history on file. History  Substance Use Topics  . Smoking status: Never Smoker   . Smokeless tobacco: Not on file  . Alcohol Use: No    Review of Systems  Constitutional: Negative for fever.  Musculoskeletal: Positive for joint swelling.  Skin: Positive for wound.  Neurological: Negative for dizziness.  All other systems reviewed and are negative.     Allergies  Review of patient's allergies indicates no known allergies.  Home Medications    Prior to Admission medications   Medication Sig Start Date End Date Taking? Authorizing Provider  ibuprofen (ADVIL,MOTRIN) 600 MG tablet Take 1 tablet (600 mg total) by mouth every 6 (six) hours as needed. 03/21/14   Garald Balding, NP  Vitamin D, Ergocalciferol, (DRISDOL) 50000 UNITS CAPS capsule Take 1 capsule (50,000 Units total) by mouth every 7 (seven) days. 03/20/14   Lorayne Marek, MD   BP 132/75  Pulse 75  Temp(Src) 98.1 F (36.7 C) (Oral)  Resp 18  SpO2 99% Physical Exam  Nursing note and vitals reviewed. Constitutional: He appears well-developed and well-nourished.  HENT:  Head: Normocephalic.  Neck: Normal range of motion.  Cardiovascular: Normal rate.   Pulmonary/Chest: Effort normal.  Abdominal: Soft.  Musculoskeletal: Normal range of motion. He exhibits tenderness.       Feet:  Neurological: He is alert.  Skin: Skin is warm and dry.    ED Course  Procedures (including critical care time) Labs Review Labs Reviewed - No data to display  Imaging Review Dg Foot Complete Left  03/21/2014   CLINICAL DATA:  FOOT INJURY  EXAM: LEFT FOOT - COMPLETE 3+ VIEW  COMPARISON:  None.  FINDINGS: There is no evidence of fracture or dislocation. Corticated osseous density at the medial aspect of the in first IP joint is chronic in nature. There is no evidence of arthropathy or other focal bone abnormality. Soft  tissues are unremarkable.  IMPRESSION: Negative.   Electronically Signed   By: Jeannine Boga M.D.   On: 03/21/2014 04:46     EKG Interpretation None      MDM   Final diagnoses:  Toe contusion, left, initial encounter  Laceration          Garald Balding, NP 03/21/14 [phone]

## 2014-03-21 NOTE — ED Notes (Signed)
Patient discharge with all personal belongings.

## 2014-03-21 NOTE — ED Provider Notes (Signed)
Medical screening examination/treatment/procedure(s) were performed by non-physician practitioner and as supervising physician I was immediately available for consultation/collaboration.   Delora Fuel, MD [phone]

## 2014-10-05 ENCOUNTER — Emergency Department (HOSPITAL_COMMUNITY): Payer: Self-pay

## 2014-10-05 ENCOUNTER — Encounter (HOSPITAL_COMMUNITY): Payer: Self-pay

## 2014-10-05 ENCOUNTER — Emergency Department (HOSPITAL_COMMUNITY)
Admission: EM | Admit: 2014-10-05 | Discharge: 2014-10-05 | Disposition: A | Discharge status: Home / Self Care | Payer: Self-pay | Attending: Emergency Medicine | Admitting: Emergency Medicine

## 2014-10-05 DIAGNOSIS — J988 Other specified respiratory disorders: Secondary | ICD-10-CM

## 2014-10-05 DIAGNOSIS — B9789 Other viral agents as the cause of diseases classified elsewhere: Secondary | ICD-10-CM

## 2014-10-05 DIAGNOSIS — Z8639 Personal history of other endocrine, nutritional and metabolic disease: Secondary | ICD-10-CM | POA: Insufficient documentation

## 2014-10-05 DIAGNOSIS — J069 Acute upper respiratory infection, unspecified: Secondary | ICD-10-CM | POA: Insufficient documentation

## 2014-10-05 MED ORDER — ACETAMINOPHEN 500 MG PO TABS: 500.0000 mg | ORAL_TABLET | Freq: Four times a day (QID) | ORAL | Status: DC | PRN

## 2014-10-05 MED ORDER — ACETAMINOPHEN 325 MG PO TABS
325.0000 mg | ORAL_TABLET | Freq: Once | ORAL | Status: AC
  Administered 2014-10-05: 325 mg via ORAL
  Filled 2014-10-05: qty 1

## 2014-10-05 MED ORDER — IBUPROFEN 800 MG PO TABS: 800.0000 mg | ORAL_TABLET | Freq: Three times a day (TID) | ORAL | Status: DC

## 2014-10-05 MED ORDER — GUAIFENESIN 100 MG/5ML PO SOLN
5.0000 mL | Freq: Once | ORAL | Status: AC
  Administered 2014-10-05: 100 mg via ORAL
  Filled 2014-10-05: qty 5

## 2014-10-05 MED ORDER — GUAIFENESIN 100 MG/5ML PO LIQD: 100.0000 mg | ORAL | Status: DC | PRN

## 2014-10-05 NOTE — Discharge Instructions (Signed)
Please follow up with your primary care physician in 1-2 days. If you do not have one please call the [facility] number listed above. Please alternate between Motrin and Tylenol every three hours for fevers and pain. Please read all discharge instructions and return precautions.   Upper Respiratory Infection, Adult An upper respiratory infection (URI) is also sometimes known as the common cold. The upper respiratory tract includes the nose, sinuses, throat, trachea, and bronchi. Bronchi are the airways leading to the lungs. Most people improve within 1 week, but symptoms can last up to 2 weeks. A residual cough may last even longer.  CAUSES Many different viruses can infect the tissues lining the upper respiratory tract. The tissues become irritated and inflamed and often become very moist. Mucus production is also common. A cold is contagious. You can easily spread the virus to others by oral contact. This includes kissing, sharing a glass, coughing, or sneezing. Touching your mouth or nose and then touching a surface, which is then touched by another person, can also spread the virus. SYMPTOMS  Symptoms typically develop 1 to 3 days after you come in contact with a cold virus. Symptoms vary from person to person. They may include:  Runny nose.  Sneezing.  Nasal congestion.  Sinus irritation.  Sore throat.  Loss of voice (laryngitis).  Cough.  Fatigue.  Muscle aches.  Loss of appetite.  Headache.  Low-grade fever. DIAGNOSIS  You might diagnose your own cold based on familiar symptoms, since most people get a cold 2 to 3 times a year. Your caregiver can confirm this based on your exam. Most importantly, your caregiver can check that your symptoms are not due to another disease such as strep throat, sinusitis, pneumonia, asthma, or epiglottitis. Blood tests, throat tests, and X-rays are not necessary to diagnose a common cold, but they may sometimes be helpful in  excluding other more serious diseases. Your caregiver will decide if any further tests are required. RISKS AND COMPLICATIONS  You may be at risk for a more severe case of the common cold if you smoke cigarettes, have chronic heart disease (such as heart failure) or lung disease (such as asthma), or if you have a weakened immune system. The very young and very old are also at risk for more serious infections. Bacterial sinusitis, middle ear infections, and bacterial pneumonia can complicate the common cold. The common cold can worsen asthma and chronic obstructive pulmonary disease (COPD). Sometimes, these complications can require emergency medical care and may be life-threatening. PREVENTION  The best way to protect against getting a cold is to practice good hygiene. Avoid oral or hand contact with people with cold symptoms. Wash your hands often if contact occurs. There is no clear evidence that vitamin C, vitamin E, echinacea, or exercise reduces the chance of developing a cold. However, it is always recommended to get plenty of rest and practice good nutrition. TREATMENT  Treatment is directed at relieving symptoms. There is no cure. Antibiotics are not effective, because the infection is caused by a virus, not by bacteria. Treatment may include:  Increased fluid intake. Sports drinks offer valuable electrolytes, sugars, and fluids.  Breathing heated mist or steam (vaporizer or shower).  Eating chicken soup or other clear broths, and maintaining good nutrition.  Getting plenty of rest.  Using gargles or lozenges for comfort.  Controlling fevers with ibuprofen or acetaminophen as directed by your caregiver.  Increasing usage of your inhaler if you have asthma.  Zinc gel and zinc lozenges, taken in the first 24 hours of the common cold, can shorten the duration and lessen the severity of symptoms. Pain medicines may help with fever, muscle aches, and throat pain. A variety of non-prescription  medicines are available to treat congestion and runny nose. Your caregiver can make recommendations and may suggest nasal or lung inhalers for other symptoms.  HOME CARE INSTRUCTIONS   Only take over-the-counter or prescription medicines for pain, discomfort, or fever as directed by your caregiver.  Use a warm mist humidifier or inhale steam from a shower to increase air moisture. This may keep secretions moist and make it easier to breathe.  Drink enough water and fluids to keep your urine clear or pale yellow.  Rest as needed.  Return to work when your temperature has returned to normal or as your caregiver advises. You may need to stay home longer to avoid infecting others. You can also use a face mask and careful hand washing to prevent spread of the virus. SEEK MEDICAL CARE IF:   After the first few days, you feel you are getting worse rather than better.  You need your caregiver's advice about medicines to control symptoms.  You develop chills, worsening shortness of breath, or brown or red sputum. These may be signs of pneumonia.  You develop yellow or brown nasal discharge or pain in the face, especially when you bend forward. These may be signs of sinusitis.  You develop a fever, swollen neck glands, pain with swallowing, or white areas in the back of your throat. These may be signs of strep throat. SEEK IMMEDIATE MEDICAL CARE IF:   You have a fever.  You develop severe or persistent headache, ear pain, sinus pain, or chest pain.  You develop wheezing, a prolonged cough, cough up blood, or have a change in your usual mucus (if you have chronic lung disease).  You develop sore muscles or a stiff neck. Document Released: 01/04/2001 Document Revised: 10/03/2011 Document Reviewed: 10/16/2013 [facility] Patient Information 2015 [place], [place]. This information is not intended to replace advice given to you by your health care provider. Make sure you discuss any questions you have  with your health care provider.

## 2014-10-05 NOTE — ED Notes (Signed)
Declined W/C at D/C and was escorted to lobby by RN. 

## 2014-10-05 NOTE — ED Notes (Signed)
Pt presents with c/o cough, nasal congestion, and body aches.  Pt has been taking Mucinex.

## 2014-10-05 NOTE — ED Provider Notes (Signed)
CSN: [id number]     Arrival date & time 10/05/14  0753 History   First MD Initiated Contact with Patient 10/05/14 [phone]     Chief Complaint  Patient presents with  . Cough  . Nasal Congestion  . Generalized Body Aches     (Consider location/radiation/quality/duration/timing/severity/associated sxs/prior Treatment) HPI Comments: Patient is a 59 yo M presenting to the ED for evaluation of 6 days of productive cough, nasal congestion, body aches, subjective fevers and chills. He states he has been trying Mucinex with no improvement of his symptoms. No modifying factors identified. No known sick contacts. No recent travel outside of the [place].   Patient is a 59 y.o. male presenting with cough.  Cough Associated symptoms: chills, fever and rhinorrhea   Associated symptoms: no chest pain and no shortness of breath     Past Medical History  Diagnosis Date  . Goiter    Past Surgical History  Procedure Laterality Date  . Appendectomy    . Thyroid surgery     No family history on file. History  Substance Use Topics  . Smoking status: Never Smoker   . Smokeless tobacco: Not on file  . Alcohol Use: No    Review of Systems  Constitutional: Positive for fever and chills.  HENT: Positive for congestion, postnasal drip and rhinorrhea.   Respiratory: Positive for cough. Negative for shortness of breath.   Cardiovascular: Negative for chest pain and leg swelling.  Gastrointestinal: Negative for vomiting, abdominal pain and diarrhea.  All other systems reviewed and are negative.     Allergies  Review of patient's allergies indicates no known allergies.  Home Medications   Prior to Admission medications   Medication Sig Start Date End Date Taking? Authorizing Provider  acetaminophen (TYLENOL) 500 MG tablet Take 1 tablet (500 mg total) by mouth every 6 (six) hours as needed. 10/05/14   Labrittany Wechter, PA-C  guaiFENesin (ROBITUSSIN) 100 MG/5ML liquid Take 5-10 mLs (100-200 mg  total) by mouth every 4 (four) hours as needed for cough. 10/05/14   Camilla Skeen, PA-C  ibuprofen (ADVIL,MOTRIN) 600 MG tablet Take 1 tablet (600 mg total) by mouth every 6 (six) hours as needed. 03/21/14   Junius Creamer, NP  ibuprofen (ADVIL,MOTRIN) 800 MG tablet Take 1 tablet (800 mg total) by mouth 3 (three) times daily. 10/05/14   Izaah Westman, PA-C  Vitamin D, Ergocalciferol, (DRISDOL) 50000 UNITS CAPS capsule Take 1 capsule (50,000 Units total) by mouth every 7 (seven) days. 03/20/14   Lorayne Marek, MD   BP 117/68 mmHg  Pulse 83  Temp(Src) 99 F (37.2 C) (Oral)  Resp 16  SpO2 100% Physical Exam  Constitutional: He is oriented to person, place, and time. He appears well-developed and well-nourished. No distress.  HENT:  Head: Normocephalic and atraumatic.  Right Ear: External ear normal.  Left Ear: External ear normal.  Nose: Nose normal.  Mouth/Throat: Oropharynx is clear and moist. No oropharyngeal exudate.  Eyes: Conjunctivae are normal.  Neck: Normal range of motion. Neck supple.  Cardiovascular: Normal rate, regular rhythm and normal heart sounds.   Pulmonary/Chest: Effort normal and breath sounds normal. No respiratory distress. He exhibits no tenderness.  Cough appreciated on examination.   Abdominal: Soft. There is no tenderness.  Musculoskeletal: Normal range of motion. He exhibits no edema.  Lymphadenopathy:    He has no cervical adenopathy.  Neurological: He is alert and oriented to person, place, and time.  Skin: Skin is warm and dry. He is not diaphoretic.  Psychiatric: He has a normal mood and affect.  Nursing note and vitals reviewed.   ED Course  Procedures (including critical care time) Medications  acetaminophen (TYLENOL) tablet 325 mg (325 mg Oral Given 10/05/14 1038)  guaiFENesin (ROBITUSSIN) 100 MG/5ML solution 100 mg (100 mg Oral Given 10/05/14 1056)    Labs Review Labs Reviewed - No data to display  Imaging Review Dg Chest 2  View  10/05/2014   CLINICAL DATA:  Cough, congestion and body aches.  Fever  EXAM: CHEST  2 VIEW  COMPARISON:  07/19/2013  FINDINGS: Normal heart size. No pleural effusion or edema identified. No airspace consolidation identified. Mild spondylosis within the thoracic spine.  IMPRESSION: No active cardiopulmonary disease.   Electronically Signed   By: Kerby Moors M.D.   On: 10/05/2014 09:45     EKG Interpretation None      MDM   Final diagnoses:  Viral respiratory illness    Filed Vitals:   10/05/14 1057  BP: 117/68  Pulse: 83  Temp: 99 F (37.2 C)  Resp: 16   I have reviewed nursing notes, vital signs, and all appropriate lab and imaging results for this patient. Pt CXR negative for acute infiltrate. Patients symptoms are consistent with URI, likely viral etiology. Discussed that antibiotics are not indicated for viral infections. Pt will be discharged with symptomatic treatment.  Verbalizes understanding and is agreeable with plan. Pt is hemodynamically stable & in NAD prior to dc.Patient is stable at time of discharge    Baron Sane, PA-C 10/05/14 [place], MD 10/05/14 1517

## 2014-11-19 ENCOUNTER — Emergency Department (HOSPITAL_COMMUNITY): Payer: Medicaid Other

## 2014-11-19 ENCOUNTER — Emergency Department (HOSPITAL_COMMUNITY)
Admission: EM | Admit: 2014-11-19 | Discharge: 2014-11-19 | Disposition: A | Discharge status: Home / Self Care | Payer: Medicaid Other | Attending: Emergency Medicine | Admitting: Emergency Medicine

## 2014-11-19 ENCOUNTER — Encounter (HOSPITAL_COMMUNITY): Payer: Self-pay | Admitting: *Deleted

## 2014-11-19 DIAGNOSIS — Z8639 Personal history of other endocrine, nutritional and metabolic disease: Secondary | ICD-10-CM | POA: Insufficient documentation

## 2014-11-19 DIAGNOSIS — M170 Bilateral primary osteoarthritis of knee: Secondary | ICD-10-CM

## 2014-11-19 DIAGNOSIS — Z791 Long term (current) use of non-steroidal anti-inflammatories (NSAID): Secondary | ICD-10-CM | POA: Insufficient documentation

## 2014-11-19 NOTE — Discharge Instructions (Signed)
Please call your doctor for a followup appointment within 24-48 hours. When you talk to your doctor please let them know that you were seen in the emergency department and have them acquire all of your records so that they can discuss the findings with you and formulate a treatment plan to fully care for your new and ongoing problems. Please follow-up with her primary care provider Please follow-up with orthopedics Please rest and stay hydrated Please avoid any heavy lifting Please keep knee sleeve on for comfort purposes Please continue to monitor symptoms closely and if symptoms are to worsen or change (fever greater than 101, chills, sweating, nausea, vomiting, chest pain, shortness of breathe, difficulty breathing, weakness, numbness, tingling, worsening or changes to pain pattern, knee swelling, red streaks running down the leg, fever, loss of sensation, fall, injury) please report back to the Emergency Department immediately.   Osteoarthritis Osteoarthritis is a disease that causes soreness and inflammation of a joint. It occurs when the cartilage at the affected joint wears down. Cartilage acts as a cushion, covering the ends of bones where they meet to form a joint. Osteoarthritis is the most common form of arthritis. It often occurs in older people. The joints affected most often by this condition include those in the:  Ends of the fingers.  Thumbs.  Neck.  Lower back.  Knees.  Hips. CAUSES  Over time, the cartilage that covers the ends of bones begins to wear away. This causes bone to rub on bone, producing pain and stiffness in the affected joints.  RISK FACTORS Certain factors can increase your chances of having osteoarthritis, including:  Older age.  Excessive body weight.  Overuse of joints.  Previous joint injury. SIGNS AND SYMPTOMS   Pain, swelling, and stiffness in the joint.  Over time, the joint may lose its normal shape.  Small deposits of bone  (osteophytes) may grow on the edges of the joint.  Bits of bone or cartilage can break off and float inside the joint space. This may cause more pain and damage. DIAGNOSIS  Your health care provider will do a physical exam and ask about your symptoms. Various tests may be ordered, such as:  X-rays of the affected joint.  An MRI scan.  Blood tests to rule out other types of arthritis.  Joint fluid tests. This involves using a needle to draw fluid from the joint and examining the fluid under a microscope. TREATMENT  Goals of treatment are to control pain and improve joint function. Treatment plans may include:  A prescribed exercise program that allows for rest and joint relief.  A weight control plan.  Pain relief techniques, such as:  Properly applied heat and cold.  Electric pulses delivered to nerve endings under the skin (transcutaneous electrical nerve stimulation [TENS]).  Massage.  Certain nutritional supplements.  Medicines to control pain, such as:  Acetaminophen.  Nonsteroidal anti-inflammatory drugs (NSAIDs), such as naproxen.  Narcotic or central-acting agents, such as tramadol.  Corticosteroids. These can be given orally or as an injection.  Surgery to reposition the bones and relieve pain (osteotomy) or to remove loose pieces of bone and cartilage. Joint replacement may be needed in advanced states of osteoarthritis. HOME CARE INSTRUCTIONS   Take medicines only as directed by your health care provider.  Maintain a healthy weight. Follow your health care provider's instructions for weight control. This may include dietary instructions.  Exercise as directed. Your health care provider can recommend specific types of exercise. These may include:  Strengthening exercises. These are done to strengthen the muscles that support joints affected by arthritis. They can be performed with weights or with exercise bands to add resistance.  Aerobic activities. These  are exercises, such as brisk walking or low-impact aerobics, that get your heart pumping.  Range-of-motion activities. These keep your joints limber.  Balance and agility exercises. These help you maintain daily living skills.  Rest your affected joints as directed by your health care provider.  Keep all follow-up visits as directed by your health care provider. SEEK MEDICAL CARE IF:   Your skin turns red.  You develop a rash in addition to your joint pain.  You have worsening joint pain.  You have a fever along with joint or muscle aches. SEEK IMMEDIATE MEDICAL CARE IF:  You have a significant loss of weight or appetite.  You have night sweats. [employer] Arthritis and Musculoskeletal and Skin Diseases: www.niams.SouthExposed.es  [employer] on Aging: http://kim-miller.com/  American College of Rheumatology: www.rheumatology.org Document Released: 07/11/2005 Document Revised: 11/25/2013 Document Reviewed: 03/18/2013 [facility] Patient Information 2015 [place], [place]. This information is not intended to replace advice given to you by your health care provider. Make sure you discuss any questions you have with your health care provider.

## 2014-11-19 NOTE — ED Provider Notes (Signed)
CSN: [id number]     Arrival date & time 11/19/14  1053 History  This chart was scribed for non-physician practitioner, Jamse Mead, PA-C, working with Francine Graven, DO, by Chester Holstein, ED Scribe. This patient was seen in room TR09C/TR09C and the patient's care was started at 12:22 PM.      Chief Complaint  Patient presents with  . Knee Pain     Patient is a 59 y.o. male presenting with knee pain. The history is provided by the patient. No language interpreter was used.  Knee Pain Associated symptoms: no back pain and no fever    HPI Comments: Samuel [name] is a 59 y.o. male with PMHx of goiter who presents to the Emergency Department complaining of recurrent bilateral knee pain with onset 3-4 weeks ago. Pt reports h/o pain in knees for 35 years. Pt states sitting down aggravates the pain. Pt works at [employer] 5 days a week and states he does heavy lifting during his 4 hour shifts.  Pt denies recent fall or injury, and h/o knee surgery. Pt denies fever, back pain, numbness or tingling in LEs, and bowel or bladder incontinence.  PCP Dr. Annitta Needs  Past Medical History  Diagnosis Date  . Goiter    Past Surgical History  Procedure Laterality Date  . Appendectomy    . Thyroid surgery     No family history on file. History  Substance Use Topics  . Smoking status: Never Smoker   . Smokeless tobacco: Not on file  . Alcohol Use: No    Review of Systems  Constitutional: Negative for fever.  Musculoskeletal: Positive for arthralgias. Negative for back pain.  Neurological: Negative for weakness and numbness.      Allergies  Review of patient's allergies indicates no known allergies.  Home Medications   Prior to Admission medications   Medication Sig Start Date End Date Taking? Authorizing Provider  acetaminophen (TYLENOL) 500 MG tablet Take 1 tablet (500 mg total) by mouth every 6 (six) hours as needed. 10/05/14   Jennifer Piepenbrink, PA-C  guaiFENesin (ROBITUSSIN) 100  MG/5ML liquid Take 5-10 mLs (100-200 mg total) by mouth every 4 (four) hours as needed for cough. 10/05/14   Jennifer Piepenbrink, PA-C  ibuprofen (ADVIL,MOTRIN) 600 MG tablet Take 1 tablet (600 mg total) by mouth every 6 (six) hours as needed. 03/21/14   Junius Creamer, NP  ibuprofen (ADVIL,MOTRIN) 800 MG tablet Take 1 tablet (800 mg total) by mouth 3 (three) times daily. 10/05/14   Jennifer Piepenbrink, PA-C  Vitamin D, Ergocalciferol, (DRISDOL) 50000 UNITS CAPS capsule Take 1 capsule (50,000 Units total) by mouth every 7 (seven) days. 03/20/14   Lorayne Marek, MD   BP 119/68 mmHg  Pulse 75  Temp(Src) 98 F (36.7 C) (Oral)  Resp 18  Ht 6\' 1"  (1.854 m)  Wt 200 lb (90.719 kg)  BMI 26.39 kg/m2  SpO2 94% Physical Exam  Constitutional: He is oriented to person, place, and time. He appears well-developed and well-nourished. No distress.  HENT:  Head: Normocephalic and atraumatic.  Eyes: Conjunctivae and EOM are normal. Right eye exhibits no discharge. Left eye exhibits no discharge.  Neck: Normal range of motion. Neck supple.  Cardiovascular: Normal rate, regular rhythm and normal heart sounds.   Pulses:      Radial pulses are 2+ on the right side, and 2+ on the left side.       Dorsalis pedis pulses are 2+ on the right side, and 2+ on the left side.  Negative  swelling or pitting edema identified to lower extremities bilaterally  Pulmonary/Chest: Effort normal. No respiratory distress. He has no wheezes. He has no rales.  Musculoskeletal: Normal range of motion. He exhibits no edema or tenderness.  Negative swelling, erythema, inflammation, lesions, sores, deformities, malalignment identified to the knees bilaterally. Negative tenderness upon palpation. Negative ecchymosis. Negative signs of trauma. Negative calf tenderness. Negative leg swelling bilaterally. Full range of motion to the knees bilaterally-full extension and extension noted. Full range of motion to the hips and lower extremities  bilaterally without difficulty. Negative anterior posterior drawer sign noted. Negative calf tenderness  Neurological: He is alert and oriented to person, place, and time. No cranial nerve deficit. He exhibits normal muscle tone. Coordination normal.  Cranial nerves III-XII grossly intact Strength 5+/5+ to lower extremities bilaterally with resistance applied, equal distribution noted Sensation intact with differentiation to sharp and dull touch Heel to knee down shin normal bilaterally Gait proper, proper balance - negative sway, negative drift, negative step-offs  Skin: Skin is warm and dry. No rash noted. He is not diaphoretic. No erythema.  Negative findings of cellulitic infection. Negative red streaks.  Psychiatric: He has a normal mood and affect. His behavior is normal. Thought content normal.  Nursing note and vitals reviewed.   ED Course  Procedures (including critical care time) DIAGNOSTIC STUDIES: Oxygen Saturation is 94% on room air, normal by my interpretation.    COORDINATION OF CARE: 12:29 PM Discussed treatment plan with patient at beside, the patient agrees with the plan and has no further questions at this time.   Labs Review Labs Reviewed - No data to display  Imaging Review Dg Knee Complete 4 Views Left  11/19/2014   CLINICAL DATA:  Left knee pain.  No known injury.  EXAM: LEFT KNEE - COMPLETE 4+ VIEW  COMPARISON:  None.  FINDINGS: There is no evidence of fracture, dislocation, or joint effusion. There is no evidence of arthropathy or other focal bone abnormality. Soft tissues are unremarkable.  Mild to moderate osteoarthritis is seen involving the medial compartment. Mild degenerative spurring also seen involving the patella. No other significant bone abnormality identified.  IMPRESSION: No acute findings.  Mild to moderate predominant medial compartment osteoarthritis.   Electronically Signed   By: Earle Gell M.D.   On: 11/19/2014 13:05   Dg Knee Complete 4 Views  Right  11/19/2014   CLINICAL DATA:  Right knee pain  EXAM: RIGHT KNEE - COMPLETE 4+ VIEW  COMPARISON:  None.  FINDINGS: Moderate to prominent medial compartmental articular space narrowing. Tricompartmental spurring. Mild prepatellar subcutaneous edema. No definite knee effusion.  Mild patellofemoral articular cartilage thinning.  IMPRESSION: 1. Osteoarthritis with moderate to prominent medial compartmental narrowing and mild patellofemoral articular cartilage narrowing. Tricompartmental spurring.   Electronically Signed   By: Van Clines M.D.   On: 11/19/2014 13:05     EKG Interpretation None      MDM   Final diagnoses:  Osteoarthritis of both knees, unspecified osteoarthritis type    Medications - No data to display  Filed Vitals:   11/19/14 1104  BP: 119/68  Pulse: 75  Temp: 98 F (36.7 C)  TempSrc: Oral  Resp: 18  Height: 6\' 1"  (1.854 m)  Weight: 200 lb (90.719 kg)  SpO2: 94%   I personally performed the services described in this documentation, which was scribed in my presence. The recorded information has been reviewed and is accurate.  Patient presenting to the ED with bilateral knee pain that is  been ongoing for approximately 35 years, worsening within the past 3-4 weeks. Patient works at [employer] where he lifts heavy objects and works approximately 5 days per week. Denied fall, injury, recent trauma, surgery, loss of sensation, numbness tingling. Plain film of right knee noted osteoarthritis with moderate and prominent medial compartmental narrowing and mild patellofemoral articular cartilage narrowing with tricompartmental spurring. Plain film of left knee no acute findings-mild to moderate predominant medial compartment osteophytes arthritis. Negative findings of acute abnormalities. Negative as of joint effusion. Pulses palpable and strong. Sensation intact. Full range of motion to the knees bilaterally. Negative signs of septic joint. Patient presenting to the ED  with osteoarthritis of the knees bilaterally. Patient stable, afebrile. Patient not septic appearing. Discharged patient. Referred patient to PCP and orthopedics. Discussed with patient to rest and stay hydrated. Discussed with patient to avoid any physical strenuous activity. Knee sleeves place for comfort purposes. Discussed with patient to closely monitor symptoms and if symptoms are to worsen or change to report back to the ED - strict return instructions given.  Patient agreed to plan of care, understood, all questions answered.    Jamse Mead, PA-C 11/19/14 Melbeta, PA-C 11/19/14 [place], DO 11/21/14 2150

## 2014-11-19 NOTE — ED Notes (Signed)
Patient reports he has had knee pain for 3-4 week.  Patient denies recent trauma.  Patient states both knees are hurting.  No pain meds prior to arrival

## 2014-11-19 NOTE — ED Notes (Signed)
Applied bilateral knee sleeves on pt; instructed pt how to put sleeves on

## 2014-12-02 ENCOUNTER — Encounter: Payer: Self-pay | Admitting: Internal Medicine

## 2014-12-02 ENCOUNTER — Ambulatory Visit: Payer: Medicaid Other | Attending: Internal Medicine | Admitting: Internal Medicine

## 2014-12-02 VITALS — BP 123/85 | HR 76 | Temp 98.0°F | Resp 16 | Wt 212.0 lb

## 2014-12-02 DIAGNOSIS — M170 Bilateral primary osteoarthritis of knee: Secondary | ICD-10-CM | POA: Diagnosis not present

## 2014-12-02 NOTE — Progress Notes (Signed)
Patient complains of bilateral knee pain With the right knee being the worse Patient has a hard time bending the knees Requesting referral to ortho

## 2014-12-02 NOTE — Progress Notes (Signed)
MRN: [id number] Name: Samuel [name]  Sex: male Age: 59 y.o. DOB: 1955-09-05  Allergies: Review of patient's allergies indicates no known allergies.  Chief Complaint  Patient presents with  . Knee Pain    HPI: Patient is 59 y.o. male who comes today complaining of bilateral knee pain, patient recently went to the emergency room with worsening knee pain, EMR reviewed patient had x-rays done which reported also arthritis, patient is requesting referral to see the specialist, currently taking ibuprofen/Aleve when necessary, denies any recent fall or trauma.  Past Medical History  Diagnosis Date  . Goiter     Past Surgical History  Procedure Laterality Date  . Appendectomy    . Thyroid surgery        Medication List       This list is accurate as of: 12/02/14 11:28 AM.  Always use your most recent med list.               acetaminophen 500 MG tablet  Commonly known as:  TYLENOL  Take 1 tablet (500 mg total) by mouth every 6 (six) hours as needed.     guaiFENesin 100 MG/5ML liquid  Commonly known as:  ROBITUSSIN  Take 5-10 mLs (100-200 mg total) by mouth every 4 (four) hours as needed for cough.     ibuprofen 600 MG tablet  Commonly known as:  ADVIL,MOTRIN  Take 1 tablet (600 mg total) by mouth every 6 (six) hours as needed.     ibuprofen 800 MG tablet  Commonly known as:  ADVIL,MOTRIN  Take 1 tablet (800 mg total) by mouth 3 (three) times daily.     Vitamin D (Ergocalciferol) 50000 UNITS Caps capsule  Commonly known as:  DRISDOL  Take 1 capsule (50,000 Units total) by mouth every 7 (seven) days.        No orders of the defined types were placed in this encounter.     There is no immunization history on file for this patient.  History reviewed. No pertinent family history.  History  Substance Use Topics  . Smoking status: Never Smoker   . Smokeless tobacco: Not on file  . Alcohol Use: No    Review of Systems   As noted in HPI  Filed Vitals:   12/02/14 1025  BP: 123/85  Pulse: 76  Temp: 98 F (36.7 C)  Resp: 16    Physical Exam  Physical Exam  Constitutional: No distress.  Eyes: EOM are normal. Pupils are equal, round, and reactive to light.  Cardiovascular: Normal rate and regular rhythm.   Pulmonary/Chest: Breath sounds normal. No respiratory distress. He has no wheezes. He has no rales.  Musculoskeletal:  Bilateral knee crepitation+    CBC    Component Value Date/Time   WBC 5.9 01/16/2014 1935   RBC 5.08 01/16/2014 1935   HGB 14.0 01/16/2014 1935   HCT 41.9 01/16/2014 1935   PLT 247 01/16/2014 1935   MCV 82.5 01/16/2014 1935   LYMPHSABS 2.0 01/16/2014 1935   MONOABS 0.9 01/16/2014 1935   EOSABS 0.1 01/16/2014 1935   BASOSABS 0.0 01/16/2014 1935    CMP     Component Value Date/Time   NA 141 03/12/2014 0916   K 4.4 03/12/2014 0916   CL 106 03/12/2014 0916   CO2 27 03/12/2014 0916   GLUCOSE 102* 03/12/2014 0916   BUN 17 03/12/2014 0916   CREATININE 0.73 03/12/2014 0916   CREATININE 0.81 01/16/2014 1935   CALCIUM 8.8 03/12/2014 0916  PROT 7.2 03/12/2014 0916   ALBUMIN 4.0 03/12/2014 0916   AST 21 03/12/2014 0916   ALT 17 03/12/2014 0916   ALKPHOS 72 03/12/2014 0916   BILITOT 0.7 03/12/2014 0916   GFRNONAA >89 03/12/2014 0916   GFRNONAA >90 01/16/2014 1935   GFRAA >89 03/12/2014 0916   GFRAA >90 01/16/2014 1935    Lab Results  Component Value Date/Time   CHOL 155 03/12/2014 09:16 AM    No results found for: HGBA1C  Lab Results  Component Value Date/Time   AST 21 03/12/2014 09:16 AM    Assessment and Plan   Osteoarthritis of both knees, unspecified osteoarthritis type - Plan: continue with Tylenol/percent when necessary  Ambulatory referral to Orthopedic Surgery   Return in about 3 months (around 03/04/2015), or if symptoms worsen or fail to improve.   This note has been created with Surveyor, quantity. Any transcriptional errors are  unintentional.    Lorayne Marek, MD

## 2014-12-11 ENCOUNTER — Ambulatory Visit: Payer: Medicaid Other | Admitting: Sports Medicine

## 2015-01-05 ENCOUNTER — Emergency Department (HOSPITAL_COMMUNITY)
Admission: EM | Admit: 2015-01-05 | Discharge: 2015-01-05 | Disposition: A | Discharge status: Home / Self Care | Payer: Medicaid Other | Attending: Emergency Medicine | Admitting: Emergency Medicine

## 2015-01-05 ENCOUNTER — Encounter (HOSPITAL_COMMUNITY): Payer: Self-pay | Admitting: Family Medicine

## 2015-01-05 DIAGNOSIS — Z8639 Personal history of other endocrine, nutritional and metabolic disease: Secondary | ICD-10-CM | POA: Insufficient documentation

## 2015-01-05 DIAGNOSIS — K088 Other specified disorders of teeth and supporting structures: Secondary | ICD-10-CM | POA: Insufficient documentation

## 2015-01-05 DIAGNOSIS — R220 Localized swelling, mass and lump, head: Secondary | ICD-10-CM | POA: Insufficient documentation

## 2015-01-05 DIAGNOSIS — K0889 Other specified disorders of teeth and supporting structures: Secondary | ICD-10-CM

## 2015-01-05 DIAGNOSIS — K029 Dental caries, unspecified: Secondary | ICD-10-CM | POA: Insufficient documentation

## 2015-01-05 DIAGNOSIS — K0381 Cracked tooth: Secondary | ICD-10-CM | POA: Insufficient documentation

## 2015-01-05 DIAGNOSIS — Z791 Long term (current) use of non-steroidal anti-inflammatories (NSAID): Secondary | ICD-10-CM | POA: Insufficient documentation

## 2015-01-05 MED ORDER — PENICILLIN V POTASSIUM 250 MG PO TABS: 250.0000 mg | ORAL_TABLET | Freq: Four times a day (QID) | ORAL | Status: AC

## 2015-01-05 NOTE — ED Notes (Signed)
Pt here for right lower dental pain and swelling. sts broken tooth.

## 2015-01-05 NOTE — ED Provider Notes (Signed)
CSN: [id number]     Arrival date & time 01/05/15  1053 History  This chart was scribed for non-physician practitioner, Glendell Docker, NP, working with Alfonzo Beers, MD, by Stephania Fragmin, ED Scribe. This patient was seen in room TR07C/TR07C and the patient's care was started at 11:31 AM.    Chief Complaint  Patient presents with  . Dental Pain   The history is provided by the patient. No language interpreter was used.     HPI Comments: Samuel [name] is a 59 y.o. male who presents to the Emergency Department complaining of constant right lower dental pain and swelling following a tooth fracture that occurred a few days ago. He denies any fever. Patient last saw a dentist 5 years ago. No problems swallowing or breathing  Past Medical History  Diagnosis Date  . Goiter    Past Surgical History  Procedure Laterality Date  . Appendectomy    . Thyroid surgery     History reviewed. No pertinent family history. History  Substance Use Topics  . Smoking status: Never Smoker   . Smokeless tobacco: Not on file  . Alcohol Use: No    Review of Systems  Constitutional: Negative for fever.  HENT: Positive for dental problem.   All other systems reviewed and are negative.   Allergies  Review of patient's allergies indicates no known allergies.  Home Medications   Prior to Admission medications   Medication Sig Start Date End Date Taking? Authorizing Provider  acetaminophen (TYLENOL) 500 MG tablet Take 1 tablet (500 mg total) by mouth every 6 (six) hours as needed. 10/05/14   Jennifer Piepenbrink, PA-C  guaiFENesin (ROBITUSSIN) 100 MG/5ML liquid Take 5-10 mLs (100-200 mg total) by mouth every 4 (four) hours as needed for cough. 10/05/14   Jennifer Piepenbrink, PA-C  ibuprofen (ADVIL,MOTRIN) 800 MG tablet Take 1 tablet (800 mg total) by mouth 3 (three) times daily. 10/05/14   Jennifer Piepenbrink, PA-C   BP 106/73 mmHg  Pulse 72  Temp(Src) 98.3 F (36.8 C) (Oral)  Resp 14  Wt 214 lb 4.8 oz  (97.206 kg)  SpO2 98% Physical Exam  Constitutional: He is oriented to person, place, and time. He appears well-developed and well-nourished. No distress.  HENT:  Head: Normocephalic and atraumatic.  Right Ear: External ear normal.  Left Ear: External ear normal.  Mouth/Throat:    Cracked and decayed teeth. No facial swelling noted  Eyes: Conjunctivae and EOM are normal.  Neck: Neck supple. No tracheal deviation present.  Cardiovascular: Normal rate.   Pulmonary/Chest: Effort normal. No respiratory distress.  Musculoskeletal: Normal range of motion.  Neurological: He is alert and oriented to person, place, and time.  Skin: Skin is warm and dry.  Psychiatric: He has a normal mood and affect. His behavior is normal.  Nursing note and vitals reviewed.   ED Course  Procedures (including critical care time)  DIAGNOSTIC STUDIES: Oxygen Saturation is 98% on RA, normal by my interpretation.    COORDINATION OF CARE: 11:32 AM - Discussed treatment plan with pt at bedside which includes Rx antibiotics and dental referral, and pt agreed to plan.  MDM   Final diagnoses:  Toothache   Pt given pcn and dental referral. No sign of ludwigs angina  I personally performed the services described in this documentation, which was scribed in my presence. The recorded information has been reviewed and is accurate.     Glendell Docker, NP 01/05/15 Malcolm, MD 01/05/15 1155

## 2015-01-05 NOTE — Discharge Instructions (Signed)

## 2015-02-03 ENCOUNTER — Ambulatory Visit: Payer: Medicaid Other | Attending: Internal Medicine | Admitting: Physician Assistant

## 2015-02-03 VITALS — BP 119/79 | HR 90 | Temp 98.2°F | Resp 16 | Wt 216.4 lb

## 2015-02-03 DIAGNOSIS — L989 Disorder of the skin and subcutaneous tissue, unspecified: Secondary | ICD-10-CM

## 2015-02-03 MED ORDER — DOXYCYCLINE HYCLATE 100 MG PO TABS: 100.0000 mg | ORAL_TABLET | Freq: Two times a day (BID) | ORAL | Status: DC

## 2015-02-03 NOTE — Progress Notes (Signed)
Chief Complaint: "bump on my face"  Subjective: This is a 59 year old male presenting acutely with " bump on my face". This has been present for at least 6 months. Now is starting to have a foul odor. He states it is nonpainful. He states it is non-mobile. There is been no drainage. There's been no fever or chills.   ROS:  GEN: denies fever or chills, denies change in weight Skin: + lesions or rashes HEENT: denies headache, earache, epistaxis, sore throat, or neck pain    Objective:  Filed Vitals:   02/03/15 1056  BP: 119/79  Pulse: 90  Temp: 98.2 F (36.8 C)  Resp: 16  Weight: 216 lb 6.4 oz (98.158 kg)  SpO2: 100%    Physical Exam:  General: in no acute distress. Skin: 1 - cm flesh colored growth right cheek. Nonpainful. Malodorous. Non mobile. HEENT: no pallor, no icterus, moist oral mucosa, no JVD, no lymphadenopathy   Medications: Prior to Admission medications   Medication Sig Start Date End Date Taking? Authorizing Provider  acetaminophen (TYLENOL) 500 MG tablet Take 1 tablet (500 mg total) by mouth every 6 (six) hours as needed. Patient not taking: Reported on 02/03/2015 10/05/14   Baron Sane, PA-C  doxycycline (VIBRA-TABS) 100 MG tablet Take 1 tablet (100 mg total) by mouth 2 (two) times daily. 02/03/15   Rylee Huestis Daneil Dan, PA-C  guaiFENesin (ROBITUSSIN) 100 MG/5ML liquid Take 5-10 mLs (100-200 mg total) by mouth every 4 (four) hours as needed for cough. Patient not taking: Reported on 02/03/2015 10/05/14   Baron Sane, PA-C  ibuprofen (ADVIL,MOTRIN) 800 MG tablet Take 1 tablet (800 mg total) by mouth 3 (three) times daily. Patient not taking: Reported on 02/03/2015 10/05/14   Baron Sane, PA-C    Assessment: Abn growth on right cheek with foul odor/cellulitis   Plan: Doxycycline BID for 10 days Dermatology referral  Follow up:as scheduled  The patient was given clear instructions to go to ER or return to medical center if symptoms  don't improve, worsen or new problems develop. The patient verbalized understanding. The patient was told to call to get lab results if they haven't heard anything in the next week.   This note has been created with Surveyor, quantity. Any transcriptional errors are unintentional.   Zettie Pho, PA-C 02/03/2015, 11:04 AM

## 2015-02-03 NOTE — Progress Notes (Signed)
Pt present for bump on right side of face x 6 month. He states he has bad odor.

## 2015-03-09 ENCOUNTER — Emergency Department (HOSPITAL_COMMUNITY)
Admission: EM | Admit: 2015-03-09 | Discharge: 2015-03-09 | Disposition: A | Discharge status: Home / Self Care | Payer: Medicaid Other | Attending: Emergency Medicine | Admitting: Emergency Medicine

## 2015-03-09 ENCOUNTER — Encounter (HOSPITAL_COMMUNITY): Payer: Self-pay | Admitting: Family Medicine

## 2015-03-09 DIAGNOSIS — M25561 Pain in right knee: Secondary | ICD-10-CM | POA: Insufficient documentation

## 2015-03-09 DIAGNOSIS — M545 Low back pain, unspecified: Secondary | ICD-10-CM

## 2015-03-09 DIAGNOSIS — M25562 Pain in left knee: Secondary | ICD-10-CM | POA: Insufficient documentation

## 2015-03-09 DIAGNOSIS — G8929 Other chronic pain: Secondary | ICD-10-CM

## 2015-03-09 DIAGNOSIS — Z8639 Personal history of other endocrine, nutritional and metabolic disease: Secondary | ICD-10-CM | POA: Insufficient documentation

## 2015-03-09 MED ORDER — CYCLOBENZAPRINE HCL 10 MG PO TABS: 10.0000 mg | ORAL_TABLET | Freq: Two times a day (BID) | ORAL | Status: DC | PRN

## 2015-03-09 MED ORDER — NAPROXEN 375 MG PO TABS: ORAL_TABLET | ORAL | Status: DC

## 2015-03-09 NOTE — Discharge Instructions (Signed)
Call Dr. Vickey Huger for management of your knees [place] [id number] [phone]  [place] IF: New numbness, tingling, weakness, or problem with the use of your arms or legs.  Severe back pain not relieved with medications.  Change in bowel or bladder control.  Increasing pain in any areas of the body (such as chest or abdominal pain).  Shortness of breath, dizziness or fainting.  Nausea (feeling sick to your stomach), vomiting, fever, or sweats.  Arthralgia Your caregiver has diagnosed you as suffering from an arthralgia. Arthralgia means there is pain in a joint. This can come from many reasons including:  Bruising the joint which causes soreness (inflammation) in the joint.  Wear and tear on the joints which occur as we grow older (osteoarthritis).  Overusing the joint.  Various forms of arthritis.  Infections of the joint. Regardless of the cause of pain in your joint, most of these different pains respond to anti-inflammatory drugs and rest. The exception to this is when a joint is infected, and these cases are treated with antibiotics, if it is a bacterial infection. HOME CARE INSTRUCTIONS   Rest the injured area for as long as directed by your caregiver. Then slowly start using the joint as directed by your caregiver and as the pain allows. Crutches as directed may be useful if the ankles, knees or hips are involved. If the knee was splinted or casted, continue use and care as directed. If an stretchy or elastic wrapping bandage has been applied today, it should be removed and re-applied every 3 to 4 hours. It should not be applied tightly, but firmly enough to keep swelling down. Watch toes and feet for swelling, bluish discoloration, coldness, numbness or excessive pain. If any of these problems (symptoms) occur, remove the ace bandage and re-apply more loosely. If these symptoms persist, contact your caregiver or return to this  location.  For the first 24 hours, keep the injured extremity elevated on pillows while lying down.  Apply ice for 15-20 minutes to the sore joint every couple hours while awake for the first half day. Then 03-04 times per day for the first 48 hours. Put the ice in a plastic bag and place a towel between the bag of ice and your skin.  Wear any splinting, casting, elastic bandage applications, or slings as instructed.  Only take over-the-counter or prescription medicines for pain, discomfort, or fever as directed by your caregiver. Do not use aspirin immediately after the injury unless instructed by your physician. Aspirin can cause increased bleeding and bruising of the tissues.  If you were given crutches, continue to use them as instructed and do not resume weight bearing on the sore joint until instructed. Persistent pain and inability to use the sore joint as directed for more than 2 to 3 days are warning signs indicating that you should see a caregiver for a follow-up visit as soon as possible. Initially, a hairline fracture (break in bone) may not be evident on X-rays. Persistent pain and swelling indicate that further evaluation, non-weight bearing or use of the joint (use of crutches or slings as instructed), or further X-rays are indicated. X-rays may sometimes not show a small fracture until a week or 10 days later. Make a follow-up appointment with your own caregiver or one to whom we have referred you. A radiologist (specialist in reading X-rays) may read your X-rays. Make sure you know how you are to obtain your X-ray results.  Do not assume everything is normal if you do not hear from [place]. SEEK MEDICAL CARE IF: Bruising, swelling, or pain increases. SEEK IMMEDIATE MEDICAL CARE IF:   Your fingers or toes are numb or blue.  The pain is not responding to medications and continues to stay the same or get worse.  The pain in your joint becomes severe.  You develop a fever over 102 F  (38.9 C).  It becomes impossible to move or use the joint. MAKE SURE YOU:   Understand these instructions.  Will watch your condition.  Will get help right away if you are not doing well or get worse. Document Released: 07/11/2005 Document Revised: 10/03/2011 Document Reviewed: 02/27/2008 [facility] Patient Information 2015 [place], [place]. This information is not intended to replace advice given to you by your health care provider. Make sure you discuss any questions you have with your health care provider.  Arthritis, Nonspecific Arthritis is inflammation of a joint. This usually means pain, redness, warmth or swelling are present. One or more joints may be involved. There are a number of types of arthritis. Your caregiver may not be able to tell what type of arthritis you have right away. CAUSES  The most common cause of arthritis is the wear and tear on the joint (osteoarthritis). This causes damage to the cartilage, which can break down over time. The knees, hips, back and neck are most often affected by this type of arthritis. Other types of arthritis and common causes of joint pain include:  Sprains and other injuries near the joint. Sometimes minor sprains and injuries cause pain and swelling that develop hours later.  Rheumatoid arthritis. This affects hands, feet and knees. It usually affects both sides of your body at the same time. It is often associated with chronic ailments, fever, weight loss and general weakness.  Crystal arthritis. Gout and pseudo gout can cause occasional acute severe pain, redness and swelling in the foot, ankle, or knee.  Infectious arthritis. Bacteria can get into a joint through a break in overlying skin. This can cause infection of the joint. Bacteria and viruses can also spread through the blood and affect your joints.  Drug, infectious and allergy reactions. Sometimes joints can become mildly painful and slightly swollen with these types of  illnesses. SYMPTOMS   Pain is the main symptom.  Your joint or joints can also be red, swollen and warm or hot to the touch.  You may have a fever with certain types of arthritis, or even feel overall ill.  The joint with arthritis will hurt with movement. Stiffness is present with some types of arthritis. DIAGNOSIS  Your caregiver will suspect arthritis based on your description of your symptoms and on your exam. Testing may be needed to find the type of arthritis:  Blood and sometimes urine tests.  X-ray tests and sometimes CT or MRI scans.  Removal of fluid from the joint (arthrocentesis) is done to check for bacteria, crystals or other causes. Your caregiver (or a specialist) will numb the area over the joint with a local anesthetic, and use a needle to remove joint fluid for examination. This procedure is only minimally uncomfortable.  Even with these tests, your caregiver may not be able to tell what kind of arthritis you have. Consultation with a specialist (rheumatologist) may be helpful. TREATMENT  Your caregiver will discuss with you treatment specific to your type of arthritis. If the specific type cannot be determined, then the following general recommendations may apply.  Treatment of severe joint pain includes:  Rest.  Elevation.  Anti-inflammatory medication (for example, ibuprofen) may be prescribed. Avoiding activities that cause increased pain.  Only take over-the-counter or prescription medicines for pain and discomfort as recommended by your caregiver.  Cold packs over an inflamed joint may be used for 10 to 15 minutes every hour. Hot packs sometimes feel better, but do not use overnight. Do not use hot packs if you are diabetic without your caregiver's permission.  A cortisone shot into arthritic joints may help reduce pain and swelling.  Any acute arthritis that gets worse over the next 1 to 2 days needs to be looked at to be sure there is no joint  infection. Long-term arthritis treatment involves modifying activities and lifestyle to reduce joint stress jarring. This can include weight loss. Also, exercise is needed to nourish the joint cartilage and remove waste. This helps keep the muscles around the joint strong. HOME CARE INSTRUCTIONS   Do not take aspirin to relieve pain if gout is suspected. This elevates uric acid levels.  Only take over-the-counter or prescription medicines for pain, discomfort or fever as directed by your caregiver.  Rest the joint as much as possible.  If your joint is swollen, keep it elevated.  Use crutches if the painful joint is in your leg.  Drinking plenty of fluids may help for certain types of arthritis.  Follow your caregiver's dietary instructions.  Try low-impact exercise such as:  Swimming.  Water aerobics.  Biking.  Walking.  Morning stiffness is often relieved by a warm shower.  Put your joints through regular range-of-motion. SEEK MEDICAL CARE IF:   You do not feel better in 24 hours or are getting worse.  You have side effects to medications, or are not getting better with treatment. SEEK IMMEDIATE MEDICAL CARE IF:   You have a fever.  You develop severe joint pain, swelling or redness.  Many joints are involved and become painful and swollen.  There is severe back pain and/or leg weakness.  You have loss of bowel or bladder control. Document Released: 08/18/2004 Document Revised: 10/03/2011 Document Reviewed: 09/03/2008 [facility] Patient Information 2015 [place], [place]. This information is not intended to replace advice given to you by your health care provider. Make sure you discuss any questions you have with your health care provider.

## 2015-03-09 NOTE — ED Provider Notes (Signed)
CSN: [id number]     Arrival date & time 03/09/15  1111 History  This chart was scribed for non-physician practitioner, Margarita Mail, PA-C working with Jola Schmidt, MD, by Erling Conte, ED Scribe. This patient was seen in room TR07C/TR07C and the patient's care was started at 11:47 AM.    Chief Complaint  Patient presents with  . Back Pain  . Knee Pain    The history is provided by the patient. No language interpreter was used.    HPI Comments: Samuel [name] is a 59 y.o. male who presents to the Emergency Department complaining of constant, moderate, gradually worsening, bilateral knee pain onset 3 months. He reports associated back pain. He admits he believes that the back pain may be due to the fact he has been walking with an antalgic gait due to his knee pain. He stated he has followed up with a specialist for this issue in the past with no relief. He admits the pain is exacerbated by ambulation and movement. He has a h/o arthritis in both knees. Pt denies taking any medications for symptoms relief. He denies any new injuries. He states he could not follow up with a PCP for this issue because his PCP would not take his Medicaid insurance. He denies any saddle anesthesia, weakness, urinary or bowel incontinence.      Past Medical History  Diagnosis Date  . Goiter    Past Surgical History  Procedure Laterality Date  . Appendectomy    . Thyroid surgery     History reviewed. No pertinent family history. Social History  Substance Use Topics  . Smoking status: Never Smoker   . Smokeless tobacco: None  . Alcohol Use: No    Review of Systems  Gastrointestinal:       Negative for bowel incontinence  Genitourinary:       Negative for urinary incontinence or saddle anesthesia  Musculoskeletal: Positive for myalgias, back pain, arthralgias and gait problem.  Neurological: Negative for weakness and numbness.      Allergies  Review of patient's allergies indicates no known  allergies.  Home Medications   Prior to Admission medications   Medication Sig Start Date End Date Taking? Authorizing Provider  acetaminophen (TYLENOL) 500 MG tablet Take 1 tablet (500 mg total) by mouth every 6 (six) hours as needed. Patient not taking: Reported on 02/03/2015 10/05/14   Baron Sane, PA-C  doxycycline (VIBRA-TABS) 100 MG tablet Take 1 tablet (100 mg total) by mouth 2 (two) times daily. 02/03/15   Tiffany Daneil Dan, PA-C  guaiFENesin (ROBITUSSIN) 100 MG/5ML liquid Take 5-10 mLs (100-200 mg total) by mouth every 4 (four) hours as needed for cough. Patient not taking: Reported on 02/03/2015 10/05/14   Baron Sane, PA-C  ibuprofen (ADVIL,MOTRIN) 800 MG tablet Take 1 tablet (800 mg total) by mouth 3 (three) times daily. Patient not taking: Reported on 02/03/2015 10/05/14   Baron Sane, PA-C   Triage Vitals: BP 121/77 mmHg  Pulse 76  Temp(Src) 98.2 F (36.8 C)  Resp 18  SpO2 97%  Physical Exam  Constitutional: He is oriented to person, place, and time. He appears well-developed and well-nourished. No distress.  HENT:  Head: Normocephalic and atraumatic.  Eyes: Conjunctivae and EOM are normal.  Neck: Neck supple. No tracheal deviation present.  Cardiovascular: Normal rate.   Pulmonary/Chest: Effort normal. No respiratory distress.  Musculoskeletal: Normal range of motion.  Neurological: He is alert and oriented to person, place, and time.  Skin: Skin is warm and  dry.  Psychiatric: He has a normal mood and affect. His behavior is normal.  Nursing note and vitals reviewed.   ED Course  Procedures (including critical care time)  DIAGNOSTIC STUDIES: Oxygen Saturation is 97% on RA, normal by my interpretation.    COORDINATION OF CARE: 12:04 PM- Will prescribe Naproxen and Flexeril to take in the PM. Advised that if he develops abdominal pain, nausea, vomiting, melena, then he needs to discontinue medication. Will provide pt with a referral to  physician that takes his insurance.   Labs Review Labs Reviewed - No data to display  Imaging Review No results found.    EKG Interpretation None      MDM   Final diagnoses:  None    Patient with back pain.  No neurological deficits and normal neuro exam.  Patient can walk but states is painful.  No loss of bowel or bladder control.  No concern for cauda equina.  No fever, night sweats, weight loss, h/o cancer, IVDU.  RICE protocol and pain medicine indicated and discussed with patient.   Patient with OA of the knees. He has been using topical olive oil as treatment. No unilateral swelling, no signs of infection. Discharge patient with NSAIDs. Follow-up with orthopedics. He appears safe for discharge at this time. I personally performed the services described in this documentation, which was scribed in my presence. The recorded information has been reviewed and is accurate.        Margarita Mail, PA-C 03/09/15 [place], MD 03/10/15 [phone]

## 2015-03-09 NOTE — ED Notes (Signed)
Pt here for right knee and lower back pain. sts hx of same.

## 2015-03-09 NOTE — ED Notes (Signed)
Patient states he has had back and bilateral knee pain x 3 months. States he was seen primary care MD and has not worked.

## 2015-03-09 NOTE — ED Notes (Signed)
Declined W/C at D/C and was escorted to lobby by RN. 

## 2015-03-19 ENCOUNTER — Ambulatory Visit: Payer: Medicaid Other | Attending: Internal Medicine

## 2015-03-19 ENCOUNTER — Inpatient Hospital Stay: Payer: Medicaid Other

## 2015-03-20 NOTE — Progress Notes (Unsigned)
Patient left before being seen.

## 2015-07-16 ENCOUNTER — Emergency Department (INDEPENDENT_AMBULATORY_CARE_PROVIDER_SITE_OTHER)
Admission: EM | Admit: 2015-07-16 | Discharge: 2015-07-16 | Disposition: A | Discharge status: Home / Self Care | Payer: Self-pay | Source: Home / Self Care | Attending: Family Medicine | Admitting: Family Medicine

## 2015-07-16 ENCOUNTER — Encounter (HOSPITAL_COMMUNITY): Payer: Self-pay | Admitting: *Deleted

## 2015-07-16 DIAGNOSIS — J0101 Acute recurrent maxillary sinusitis: Secondary | ICD-10-CM

## 2015-07-16 MED ORDER — IPRATROPIUM BROMIDE 0.06 % NA SOLN: 2.0000 | Freq: Four times a day (QID) | NASAL | Status: DC

## 2015-07-16 MED ORDER — AZITHROMYCIN 250 MG PO TABS: ORAL_TABLET | ORAL | Status: DC

## 2015-07-16 NOTE — ED Notes (Signed)
Pt  Reports   Symptoms  Of cough  /  Congested       X  1  Month         He   Is   Sitting  Upright  On  The  Exam  Table  Speaking  In  Complete   sentances

## 2015-07-16 NOTE — ED Provider Notes (Signed)
CSN: [id number]     Arrival date & time 07/16/15  1318 History   First MD Initiated Contact with Patient 07/16/15 1346     Chief Complaint  Patient presents with  . URI   (Consider location/radiation/quality/duration/timing/severity/associated sxs/prior Treatment) Patient is a 59 y.o. male presenting with URI. The history is provided by the patient.  URI Presenting symptoms: congestion, cough and rhinorrhea   Presenting symptoms: no fever   Severity:  Mild Onset quality:  Gradual Duration:  1 month Progression:  Unchanged Chronicity:  New Relieved by:  None tried Worsened by:  Nothing tried Ineffective treatments:  None tried Associated symptoms: sneezing   Associated symptoms: no headaches, no myalgias, no sinus pain and no wheezing     Past Medical History  Diagnosis Date  . Goiter    Past Surgical History  Procedure Laterality Date  . Appendectomy    . Thyroid surgery     History reviewed. No pertinent family history. Social History  Substance Use Topics  . Smoking status: Never Smoker   . Smokeless tobacco: None  . Alcohol Use: No    Review of Systems  Constitutional: Negative.  Negative for fever.  HENT: Positive for congestion, postnasal drip, rhinorrhea and sneezing.   Respiratory: Positive for cough. Negative for shortness of breath and wheezing.   Cardiovascular: Negative.   Musculoskeletal: Negative for myalgias.  Neurological: Negative for headaches.  All other systems reviewed and are negative.   Allergies  Review of patient's allergies indicates no known allergies.  Home Medications   Prior to Admission medications   Medication Sig Start Date End Date Taking? Authorizing Provider  acetaminophen (TYLENOL) 500 MG tablet Take 1 tablet (500 mg total) by mouth every 6 (six) hours as needed. Patient not taking: Reported on 02/03/2015 10/05/14   Baron Sane, PA-C  azithromycin (ZITHROMAX Z-PAK) 250 MG tablet Take as directed on pack 07/16/15    Billy Fischer, MD  cyclobenzaprine (FLEXERIL) 10 MG tablet Take 1 tablet (10 mg total) by mouth 2 (two) times daily as needed for muscle spasms. 03/09/15   Margarita Mail, PA-C  doxycycline (VIBRA-TABS) 100 MG tablet Take 1 tablet (100 mg total) by mouth 2 (two) times daily. 02/03/15   Tiffany Daneil Dan, PA-C  guaiFENesin (ROBITUSSIN) 100 MG/5ML liquid Take 5-10 mLs (100-200 mg total) by mouth every 4 (four) hours as needed for cough. Patient not taking: Reported on 02/03/2015 10/05/14   Baron Sane, PA-C  ipratropium (ATROVENT) 0.06 % nasal spray Place 2 sprays into both nostrils 4 (four) times daily. 07/16/15   Billy Fischer, MD  naproxen (NAPROSYN) 375 MG tablet Take this medication once a day with food 03/09/15   Margarita Mail, PA-C   Meds Ordered and Administered this Visit  Medications - No data to display  BP 115/61 mmHg  Pulse 75  Temp(Src) 98.2 F (36.8 C) (Oral)  Resp 18  SpO2 96% No data found.   Physical Exam  Constitutional: He is oriented to person, place, and time. He appears well-developed and well-nourished. No distress.  HENT:  Head: Normocephalic.  Right Ear: External ear normal.  Left Ear: External ear normal.  Mouth/Throat: Oropharynx is clear and moist.  Eyes: Conjunctivae and EOM are normal. Pupils are equal, round, and reactive to light.  Neck: Normal range of motion. Neck supple.  Cardiovascular: Normal rate and normal heart sounds.   Pulmonary/Chest: Effort normal and breath sounds normal. He has no wheezes. He has no rales.  Lymphadenopathy:  He has no cervical adenopathy.  Neurological: He is alert and oriented to person, place, and time.  Skin: Skin is warm and dry.  Nursing note and vitals reviewed.   ED Course  Procedures (including critical care time)  Labs Review Labs Reviewed - No data to display  Imaging Review No results found.   Visual Acuity Review  Right Eye Distance:   Left Eye Distance:   Bilateral Distance:    Right  Eye Near:   Left Eye Near:    Bilateral Near:         MDM   1. Acute recurrent maxillary sinusitis        Billy Fischer, MD 07/16/15 1356

## 2015-07-31 ENCOUNTER — Encounter: Payer: Self-pay | Admitting: Internal Medicine

## 2015-07-31 ENCOUNTER — Ambulatory Visit (INDEPENDENT_AMBULATORY_CARE_PROVIDER_SITE_OTHER): Payer: Self-pay | Admitting: Internal Medicine

## 2015-07-31 VITALS — BP 120/80 | HR 84 | Ht 71.0 in | Wt 225.0 lb

## 2015-07-31 DIAGNOSIS — M25561 Pain in right knee: Secondary | ICD-10-CM

## 2015-07-31 DIAGNOSIS — M25562 Pain in left knee: Secondary | ICD-10-CM

## 2015-07-31 NOTE — Patient Instructions (Signed)
If you do not hear from [place] in 2 weeks regarding the orthopedic surgeon from [facility], call the clinic Please apply for the scholarship application for [employer] and start swimming regularly

## 2015-07-31 NOTE — Progress Notes (Signed)
   Subjective:    Patient ID: Samuel [name], male    DOB: [birth year], 61 y.o.   MRN: [id number]  HPI  New patient here to establish  Having bilateral knee pain.  States had bilateral knee pain 30 years ago without swelling.  Was given medication that took the pain away after 1 week.  Has had bilateral knee pain for more than 6 months. Denies any history of injury recently.  Has developed swelling of knees as well.  Left seems to be worse with pain and swelling than right.  No redness or heat from the joints.  No definite problem with other joints other than left low back.  Pt. Was seen in ED with the pain in April, xrays obtained showed OA of both knees, both within joint and patellar changes as well.  Has been taking Celebrex 200 mg twice daily.  Helps, but patient wants the problem to "go away"  Has a physician (rheumatologist) friend in [place] who injected his knees about 3 months ago.  Helped for about 1 week.  Sounds like some fluid was also removed from knees.  Pt. States he is not going back there because he cannot afford the appointments any longer.    .    Review of Systems     Objective:   Physical Exam NAD Lungs:  CTA CV:  RRR without murmur or rub, radial and DP pulses normal and equal Extrems:  Knees:  No effusion, Some hypertrophic change bilaterally.  Tender over joint line both medially and laterally.  Nontender with pressure over patella bilaterally.  No cruciate or collateral ligament laxity or pain with stress maneuvers.  Limps a bit with gait, favoring left knee more       Assessment & Plan:  Bilateral Knee pain:  History of OA No injection for now--pt. Not interested. Referral [employer] ortho [employer] scholarship to get into swimming Schedule physical

## 2015-09-28 ENCOUNTER — Encounter: Payer: Self-pay | Admitting: Internal Medicine

## 2015-09-28 ENCOUNTER — Ambulatory Visit (INDEPENDENT_AMBULATORY_CARE_PROVIDER_SITE_OTHER): Payer: Self-pay | Admitting: Internal Medicine

## 2015-09-28 VITALS — BP 118/76 | HR 82 | Ht 71.0 in | Wt 223.0 lb

## 2015-09-28 DIAGNOSIS — M170 Bilateral primary osteoarthritis of knee: Secondary | ICD-10-CM

## 2015-09-28 DIAGNOSIS — R079 Chest pain, unspecified: Secondary | ICD-10-CM

## 2015-09-28 DIAGNOSIS — Z23 Encounter for immunization: Secondary | ICD-10-CM

## 2015-09-28 DIAGNOSIS — E042 Nontoxic multinodular goiter: Secondary | ICD-10-CM

## 2015-09-28 MED ORDER — CELECOXIB 200 MG PO CAPS: ORAL_CAPSULE | ORAL | Status: DC

## 2015-09-28 NOTE — Progress Notes (Signed)
Subjective:    Patient ID: Samuel [name], male    DOB: [birth year], 60 y.o.   MRN: [id number]  HPI  Here for CPE:  Has never had a physical previously.   Son, [place], interpreting.  Health Maintenance: No history of PSA No history of colonoscopy or guaiac cards Immunizations:  Did have immun[place], but cannot recall what they were.   No influenza or Tdap since coming to [place] Later, found he had received MMR and Tdap in 2015 via [facility].  Multinodular Goiter:  Underwent partial thyroidectomy in 1998, [place].  Still gets larger and then smaller at times  2.  OA of bilateral knees  PSH 1. Appendectomy 2.  1998: Partial Thyroidectomy for multinodular goiter   Current outpatient prescriptions:  .  acetaminophen (TYLENOL) 500 MG tablet, Take 1 tablet (500 mg total) by mouth every 6 (six) hours as needed., Disp: 30 tablet, Rfl: 0 .  celecoxib (CELEBREX) 200 MG capsule, 1 capsule by mouth once daily, Disp: 30 capsule, Rfl: 6   Allergies:  NKDA  Family History  Problem Relation Age of Onset  . Arthritis Sister   . Arthritis Brother   . Prostate cancer Brother       Review of Systems  Constitutional: Negative for fever, activity change and appetite change.  HENT: Negative for ear pain and hearing loss.   Eyes: Positive for visual disturbance.       Difficult to read.   Has tried reading glasses on, but does not sound like tried different strengths  Respiratory: Negative for shortness of breath.        No history of pulmonary TB or positive TB testing  Cardiovascular: Negative for palpitations and leg swelling.       Pain in right chest he states relates to neck when he has swelling in thyroid area.  Pain is never with exertion--generally only at rest.  Gastrointestinal: Negative for abdominal pain, diarrhea, constipation and blood in stool.       No melena or hematochezia  Endocrine: Negative for polydipsia and polyphagia.    Genitourinary: Negative for dysuria, urgency, decreased urine volume, discharge, difficulty urinating, penile pain and testicular pain.  Musculoskeletal:       Knees continue to cause pain and sometimes with swelling.  Has not filled our [employer] scholarship application to get into pool exercises/swimming to strengthen muscles about knees.  Neurological: Negative for dizziness, weakness, light-headedness and numbness.  Psychiatric/Behavioral: Negative for dysphoric mood. The patient is not nervous/anxious.        Objective:   Physical Exam  Constitutional: He is oriented to person, place, and time. He appears well-developed and well-nourished.  HENT:  Head: Normocephalic and atraumatic.  Right Ear: Hearing, tympanic membrane and external ear normal.  Left Ear: Hearing, tympanic membrane and external ear normal.  Nose: Nose normal.  Mouth/Throat: Oropharynx is clear and moist and mucous membranes are normal. Dental caries present.  Eyes: Conjunctivae and EOM are normal. Pupils are equal, round, and reactive to light.  Discs sharp bilaterally without AV nicking  Neck: Normal range of motion. Neck supple. Thyromegaly present.  Thyroid is large and diffusely nodular and nontender to palpation  Cardiovascular: Normal rate, regular rhythm, S1 normal, S2 normal and normal pulses.  Exam reveals no S3 and no S4.   No murmur heard. Pulmonary/Chest: Effort normal and breath sounds normal.  Abdominal: Soft. Normal appearance and bowel sounds are normal. He exhibits no mass.  There is no hepatosplenomegaly. There is no tenderness. Hernia confirmed negative in the right inguinal area and confirmed negative in the left inguinal area.  Midline incisional surgical scar  Genitourinary: Rectum normal, testes normal and penis normal. Rectal exam shows no mass. Guaiac negative stool. Right testis shows no mass. Left testis shows no mass. No penile tenderness.  Prostate mildly enlarged, but without mass or  tenderness.  Musculoskeletal: Normal range of motion. He exhibits no edema.       Right knee: He exhibits no effusion, no LCL laxity, normal patellar mobility and no MCL laxity. Tenderness found. Medial joint line and lateral joint line tenderness noted.       Left knee: He exhibits no effusion, no LCL laxity and normal patellar mobility. Tenderness found. Medial joint line and lateral joint line tenderness noted.  Nontender over chest wall  Lymphadenopathy:       Head (right side): No submental and no submandibular adenopathy present.       Head (left side): No submental and no submandibular adenopathy present.    He has no cervical adenopathy.    He has no axillary adenopathy.       Right: No inguinal adenopathy present.       Left: No inguinal adenopathy present.  Neurological: He is alert and oriented to person, place, and time. He has normal strength and normal reflexes. No cranial nerve deficit. Coordination normal.  Skin: Skin is warm and dry. No rash noted.  Psychiatric: He has a normal mood and affect. His speech is normal and behavior is normal.          Assessment & Plan:  1.  CPE:   Patient up to date with Tdap after investigation. Influenza today Return in next week for fasting labs:  A1C, FLP, CMP, TSH, PSA Brother recently died due to prostate cancer in his early 85s Guaiac Cards to return when he comes in for labs Unable to get screening colonoscopy that is affordable at this time.  2.  OA of knees:  Refill Celebrex--Can get at [facility] through [facility]  3.  Multinodular Goiter:  Check TSH with fasting labs and schedule thyroid ultrasound to determine if any nodules require FNA.  4.  Chest pain:  EKG normal and history not supportive of cardiac cause.

## 2015-10-05 ENCOUNTER — Telehealth: Payer: Self-pay | Admitting: Internal Medicine

## 2015-10-05 ENCOUNTER — Encounter: Payer: Self-pay | Admitting: Internal Medicine

## 2015-10-05 MED ORDER — CELECOXIB 200 MG PO CAPS: ORAL_CAPSULE | ORAL | Status: DC

## 2015-10-05 NOTE — Telephone Encounter (Signed)
Patient aware will go to [address] and start process

## 2015-10-07 ENCOUNTER — Other Ambulatory Visit (INDEPENDENT_AMBULATORY_CARE_PROVIDER_SITE_OTHER): Payer: Self-pay

## 2015-10-07 DIAGNOSIS — Z0000 Encounter for general adult medical examination without abnormal findings: Secondary | ICD-10-CM

## 2015-10-08 LAB — COMPREHENSIVE METABOLIC PANEL
A/G RATIO: 1.2 (ref 1.2–2.2)
ALT: 24 IU/L (ref 0–44)
AST: 22 IU/L (ref 0–40)
Albumin: 3.8 g/dL (ref 3.5–5.5)
Alkaline Phosphatase: 78 IU/L (ref 39–117)
BUN/Creatinine Ratio: 22 — ABNORMAL HIGH (ref 9–20)
BUN: 16 mg/dL (ref 6–24)
Bilirubin Total: 0.4 mg/dL (ref 0.0–1.2)
CALCIUM: 9 mg/dL (ref 8.7–10.2)
CO2: 24 mmol/L (ref 18–29)
CREATININE: 0.74 mg/dL — AB (ref 0.76–1.27)
Chloride: 101 mmol/L (ref 96–106)
GFR calc non Af Amer: 102 mL/min/{1.73_m2} (ref >59)
GFR, EST AFRICAN AMERICAN: 118 mL/min/{1.73_m2} (ref >59)
Globulin, Total: 3.1 g/dL (ref 1.5–4.5)
Glucose: 101 mg/dL — ABNORMAL HIGH (ref 65–99)
POTASSIUM: 4 mmol/L (ref 3.5–5.2)
Sodium: 139 mmol/L (ref 134–144)
TOTAL PROTEIN: 6.9 g/dL (ref 6.0–8.5)

## 2015-10-08 LAB — LIPID PANEL W/O CHOL/HDL RATIO
Cholesterol, Total: 167 mg/dL (ref 100–199)
HDL: 48 mg/dL (ref >39)
LDL CALC: 100 mg/dL — AB (ref 0–99)
Triglycerides: 95 mg/dL (ref 0–149)
VLDL CHOLESTEROL CAL: 19 mg/dL (ref 5–40)

## 2015-10-08 LAB — HGB A1C W/O EAG: HEMOGLOBIN A1C: 6.8 % — AB (ref 4.8–5.6)

## 2015-10-08 LAB — PSA: PROSTATE SPECIFIC AG, SERUM: 0.9 ng/mL (ref 0.0–4.0)

## 2015-10-14 LAB — TSH: TSH: 1.08 u[IU]/mL (ref 0.450–4.500)

## 2015-10-14 LAB — SPECIMEN STATUS REPORT

## 2015-11-16 ENCOUNTER — Encounter: Payer: Self-pay | Admitting: Internal Medicine

## 2015-11-16 ENCOUNTER — Ambulatory Visit (INDEPENDENT_AMBULATORY_CARE_PROVIDER_SITE_OTHER): Payer: Self-pay | Admitting: Internal Medicine

## 2015-11-16 VITALS — BP 112/74 | HR 74 | Resp 16 | Ht 71.0 in | Wt 227.2 lb

## 2015-11-16 DIAGNOSIS — L720 Epidermal cyst: Secondary | ICD-10-CM

## 2015-11-16 NOTE — Patient Instructions (Signed)
Call in one month if you have not heard about dermatology referral or thyroid ultrasound.

## 2015-11-16 NOTE — Progress Notes (Signed)
   Subjective:    Patient ID: Samuel [name], male    DOB: [birth year], 60 y.o.   MRN: [id number]  HPI  Lump on right cheek for several months:  Tried to squeeze, but nothing came out.  Sounds like something has come out before and has a bad smell.  Has not really gotten bigger.  Itches at times.  No bleeding.   Describes having the lesion incised and cleared a few years ago.  Does not sound like the cyst walls were removed however.  Has not had his thyroid ultrasound yet.  TSH was okay.   Current outpatient prescriptions:  .  acetaminophen (TYLENOL) 500 MG tablet, Take 1 tablet (500 mg total) by mouth every 6 (six) hours as needed., Disp: 30 tablet, Rfl: 0 .  celecoxib (CELEBREX) 200 MG capsule, 1 capsule by mouth once daily, Disp: 30 capsule, Rfl: 6   No Known Allergies    Review of Systems     Objective:   Physical Exam  2 cm cyst with pinhole opening just anterior and superior to angle of right mandible.  Small amount of clear discharge can be expressed.  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 Referral to Dermatology through [facility]--not sure if they will remove.   If not, we can perform here, but discussed he would have a scar.

## 2015-12-07 ENCOUNTER — Ambulatory Visit (INDEPENDENT_AMBULATORY_CARE_PROVIDER_SITE_OTHER): Payer: Self-pay | Admitting: Internal Medicine

## 2015-12-07 ENCOUNTER — Ambulatory Visit: Payer: Self-pay | Admitting: Sports Medicine

## 2015-12-07 VITALS — BP 112/68 | HR 72 | Resp 16 | Ht 71.0 in | Wt 223.5 lb

## 2015-12-07 DIAGNOSIS — E118 Type 2 diabetes mellitus with unspecified complications: Secondary | ICD-10-CM

## 2015-12-07 LAB — GLUCOSE, POCT (MANUAL RESULT ENTRY): POC Glucose: 118 mg/dl — AB (ref 70–99)

## 2015-12-07 MED ORDER — METFORMIN HCL ER 500 MG PO TB24: ORAL_TABLET | ORAL | Status: DC

## 2015-12-07 MED ORDER — GLUCOSE BLOOD VI STRP: ORAL_STRIP | Status: DC

## 2015-12-07 MED ORDER — BLOOD GLUCOSE MONITOR KIT: PACK | Status: DC

## 2015-12-07 NOTE — Progress Notes (Signed)
   Subjective:    Patient ID: Samuel [name], male    DOB: [birth year], 58 y.o.   MRN: [id number]  HPI   New diagnosis of DM:  Mild with fasting labs recently at 101.  Looking back at old labs, he had sugars up to 156 in 2013/2014.  He does not feel his weight is much different than previously as well.  A1C subsequently added to labs and returned at 6.8% No family history of diabetes  Attempted to discuss diabetes, but pt. Not engaged today.  Had eyes checked at a screening for his eyes at the [facility] [place] recently.  Was told he has a problem and needs to be seen.  States his vision was okay.  LDL recently at 100, rest of Lipid panel at goal.   Current outpatient prescriptions:  .  acetaminophen (TYLENOL) 500 MG tablet, Take 1 tablet (500 mg total) by mouth every 6 (six) hours as needed., Disp: 30 tablet, Rfl: 0 .  celecoxib (CELEBREX) 200 MG capsule, 1 capsule by mouth once daily, Disp: 30 capsule, Rfl: 6   No Known Allergies       Review of Systems     Objective:   Physical Exam NAD HEENT:  PERRL, EOMI, Discs sharp.  No obvious exudate or hemorrhage noted. Neck:  Supple, no adenopathy Chest:  CTA CV:  RRR without murmur or rub, radial pulses normal and equal.       Assessment & Plan:  1.  New diagnosis of DM, likely with this for a couple of years.  Start Metformin ER 500 mg.  Long discussion about diet and physical activity to treat, though patient not very receptive today. Pneumovax with next visit. Yearly influenza vaccine recommended in fall. Check urine microalbumin.  2.  Recent abnormality with eye screening --pt. Cannot recall what was found and does not have paperwork.  Needs diabetic eye exam regardless.  Referral.

## 2015-12-08 LAB — MICROALBUMIN, URINE: MICROALBUM., U, RANDOM: 12.2 ug/mL

## 2015-12-21 ENCOUNTER — Encounter: Payer: Self-pay | Admitting: Internal Medicine

## 2016-02-08 ENCOUNTER — Ambulatory Visit (INDEPENDENT_AMBULATORY_CARE_PROVIDER_SITE_OTHER): Payer: Self-pay | Admitting: Internal Medicine

## 2016-02-08 ENCOUNTER — Encounter: Payer: Self-pay | Admitting: Internal Medicine

## 2016-02-08 VITALS — BP 140/90 | HR 72 | Resp 16 | Ht 71.75 in | Wt 223.0 lb

## 2016-02-08 DIAGNOSIS — E118 Type 2 diabetes mellitus with unspecified complications: Secondary | ICD-10-CM

## 2016-02-08 DIAGNOSIS — Z23 Encounter for immunization: Secondary | ICD-10-CM

## 2016-02-08 DIAGNOSIS — B353 Tinea pedis: Secondary | ICD-10-CM

## 2016-02-08 DIAGNOSIS — B351 Tinea unguium: Secondary | ICD-10-CM

## 2016-02-08 LAB — GLUCOSE, POCT (MANUAL RESULT ENTRY): POC Glucose: 125 mg/dl — AB (ref 70–99)

## 2016-02-08 MED ORDER — METFORMIN HCL ER 500 MG PO TB24: ORAL_TABLET | ORAL | Status: DC

## 2016-02-08 MED ORDER — TERBINAFINE HCL 250 MG PO TABS: 250.0000 mg | ORAL_TABLET | Freq: Every day | ORAL | Status: DC

## 2016-02-08 MED ORDER — GLUCOSE BLOOD VI STRP: ORAL_STRIP | Status: DC

## 2016-02-08 MED ORDER — BLOOD GLUCOSE MONITOR KIT: PACK | Status: DC

## 2016-02-08 NOTE — Patient Instructions (Signed)
Spray shoes with Lysol after starting Terbinafine and then spray them and allow to dry after each time worn. Wash shower floor with bleach containing bathroom cleaner.

## 2016-02-08 NOTE — Progress Notes (Signed)
   Subjective:    Patient ID: Samuel [name], male    DOB: Nov 11, 1955, 60 y.o.   MRN: [id number]  HPI   1.  DM:  Never filled his medication for DM.  Did not go to [facility] to get glucometer.  Unfortunately, bought his at [facility] instead and paid significantly more.   Not really physically active.   Does check feet nightly, does not apply lotion. No peripheral neuropathy symptoms. Has not had Pneumovax. Received flu vaccine end of winter/beginning of spring.  2.  Borderline BP:  Has been fine in past.  3.  Sense of pulling on his testicles/scrotum.  Had this sensation some months ago and 2 subsequent times, the last 2 days ago.  Lasts for about 10-15 minutes, he thinks.  Seems to go away once he stands up out of car.  Denies that something is actually pulling on his scrotum and just needs rearrangement. No penile discharge.  No swelling redness or distinct pain of a testicle.   States he is monogamous with his wife.  No Known Allergies     Review of Systems     Objective:   Physical Exam  HEENT:  PERRL, EOMI,  Neck:  Supple, no adenopathy Chest:  CTA CV:  RRR without murmur or rub, radial pulses normal and equal GU:  Pt. Actually points to his inguinal area as source of "pullling sensation"  No mass, no inguinal adenopathy, no inguinal hernia palpated.  Testicle descended and without mass.  No skin lesion of scrotum.  No penile lesion, tenderness or discharge. Feet :  Generalized flaking with interdigital thick soft white tissue with fissuring.  Toenails thickened, malformed and yellowed      Assessment & Plan:  1.  DM Type 2: Encouraged  Pt. To get back on medication with Metformin XR 500 mg daily Pt. Would like to get into see eye doctor and is willing to pay the $69 to do so now rather than waiting. Pneumovax 23 valent IM today. Rewrote prescriptions for glucometer and test strips as well as Metformin XR to have ready for pick up.  2.  Borderline BP:  Follow for now  3.   Toenail onychomycosis and tinea pedis:  Terbinafine 250 mg for 90 days with spraying of shoes and cleaning of shower floor regularly. Follow up in 6 weeks.  4.  Multinodular goiter:  Waiting for slot for ultrasound of thyroid

## 2016-02-10 ENCOUNTER — Encounter: Payer: Self-pay | Admitting: Internal Medicine

## 2016-02-10 ENCOUNTER — Telehealth: Payer: Self-pay | Admitting: Internal Medicine

## 2016-02-11 NOTE — Telephone Encounter (Signed)
Patient has appointment on Thursday, August 3rd at 1:45 PM at [facility] (Dr. Tora Perches)- [address] [place], [place] [address]. Telephone: [phone] Picture ID, Orange Card, Co pay, someone over the age of 10 to interpret (if needed) at the time of service. Per Stormy Fabian- Appointment letter sent by mail .  Left voicemail for patient to return call

## 2016-02-11 NOTE — Telephone Encounter (Signed)
Samuel [name] aware patient is willing to pay higher co pay and will get him scheduled.

## 2016-03-21 ENCOUNTER — Ambulatory Visit: Payer: Self-pay | Admitting: Internal Medicine

## 2016-03-30 ENCOUNTER — Emergency Department (HOSPITAL_COMMUNITY): Payer: Self-pay

## 2016-03-30 ENCOUNTER — Emergency Department (HOSPITAL_COMMUNITY)
Admission: EM | Admit: 2016-03-30 | Discharge: 2016-03-30 | Disposition: A | Discharge status: Home / Self Care | Payer: Self-pay | Attending: Emergency Medicine | Admitting: Emergency Medicine

## 2016-03-30 ENCOUNTER — Encounter (HOSPITAL_COMMUNITY): Payer: Self-pay | Admitting: Emergency Medicine

## 2016-03-30 DIAGNOSIS — R0789 Other chest pain: Secondary | ICD-10-CM | POA: Insufficient documentation

## 2016-03-30 DIAGNOSIS — E119 Type 2 diabetes mellitus without complications: Secondary | ICD-10-CM | POA: Insufficient documentation

## 2016-03-30 DIAGNOSIS — R112 Nausea with vomiting, unspecified: Secondary | ICD-10-CM | POA: Insufficient documentation

## 2016-03-30 DIAGNOSIS — R197 Diarrhea, unspecified: Secondary | ICD-10-CM

## 2016-03-30 DIAGNOSIS — Z7984 Long term (current) use of oral hypoglycemic drugs: Secondary | ICD-10-CM | POA: Insufficient documentation

## 2016-03-30 HISTORY — DX: Type 2 diabetes mellitus without complications: E11.9

## 2016-03-30 LAB — URINALYSIS, ROUTINE W REFLEX MICROSCOPIC
BILIRUBIN URINE: NEGATIVE
Glucose, UA: NEGATIVE mg/dL
Hgb urine dipstick: NEGATIVE
KETONES UR: NEGATIVE mg/dL
Leukocytes, UA: NEGATIVE
Nitrite: NEGATIVE
PH: 5 (ref 5.0–8.0)
Protein, ur: NEGATIVE mg/dL
SPECIFIC GRAVITY, URINE: 1.029 (ref 1.005–1.030)

## 2016-03-30 LAB — COMPREHENSIVE METABOLIC PANEL
ALK PHOS: 71 U/L (ref 38–126)
ALT: 17 U/L (ref 17–63)
ANION GAP: 11 (ref 5–15)
AST: 31 U/L (ref 15–41)
Albumin: 3.8 g/dL (ref 3.5–5.0)
BILIRUBIN TOTAL: 1.6 mg/dL — AB (ref 0.3–1.2)
BUN: 17 mg/dL (ref 6–20)
CALCIUM: 9 mg/dL (ref 8.9–10.3)
CO2: 20 mmol/L — AB (ref 22–32)
CREATININE: 0.85 mg/dL (ref 0.61–1.24)
Chloride: 104 mmol/L (ref 101–111)
Glucose, Bld: 131 mg/dL — ABNORMAL HIGH (ref 65–99)
Potassium: 4.7 mmol/L (ref 3.5–5.1)
SODIUM: 135 mmol/L (ref 135–145)
TOTAL PROTEIN: 7.2 g/dL (ref 6.5–8.1)

## 2016-03-30 LAB — CBC
HEMATOCRIT: 47.7 % (ref 39.0–52.0)
HEMOGLOBIN: 15.4 g/dL (ref 13.0–17.0)
MCH: 27.1 pg (ref 26.0–34.0)
MCHC: 32.3 g/dL (ref 30.0–36.0)
MCV: 83.8 fL (ref 78.0–100.0)
Platelets: 226 10*3/uL (ref 150–400)
RBC: 5.69 MIL/uL (ref 4.22–5.81)
RDW: 14.2 % (ref 11.5–15.5)
WBC: 9.1 10*3/uL (ref 4.0–10.5)

## 2016-03-30 LAB — I-STAT TROPONIN, ED: TROPONIN I, POC: 0 ng/mL (ref 0.00–0.08)

## 2016-03-30 LAB — TROPONIN I: Troponin I: 0.04 ng/mL (ref <0.03)

## 2016-03-30 MED ORDER — ONDANSETRON HCL 4 MG/2ML IJ SOLN
4.0000 mg | Freq: Once | INTRAMUSCULAR | Status: AC
  Administered 2016-03-30: 4 mg via INTRAVENOUS
  Filled 2016-03-30: qty 2

## 2016-03-30 MED ORDER — ACETAMINOPHEN 500 MG PO TABS
1000.0000 mg | ORAL_TABLET | Freq: Once | ORAL | Status: AC
  Administered 2016-03-30: 1000 mg via ORAL
  Filled 2016-03-30: qty 2

## 2016-03-30 MED ORDER — SODIUM CHLORIDE 0.9 % IV BOLUS (SEPSIS)
1000.0000 mL | Freq: Once | INTRAVENOUS | Status: AC
  Administered 2016-03-30: 1000 mL via INTRAVENOUS

## 2016-03-30 NOTE — ED Notes (Signed)
Patient transported to X-ray 

## 2016-03-30 NOTE — Discharge Instructions (Signed)
It was our pleasure to provide your ER care today - we hope that you feel better.  Rest. Drink plenty of fluids.  Follow up with your primary care doctor in the coming week - call office to arrange appointment.  Return to ER right away if worse, new symptoms, persistent/recurrent chest pain, trouble breathing, persistent vomiting, severe abdominal pain, other concern.

## 2016-03-30 NOTE — ED Notes (Signed)
MD at bedside. 

## 2016-03-30 NOTE — ED Notes (Signed)
Pt informed of urine sample needed. Pt states he is unable to pee at this time, urinal at bedside.

## 2016-03-30 NOTE — ED Triage Notes (Signed)
Pt sts N/V and body aches with fever starting last night

## 2016-03-30 NOTE — ED Provider Notes (Addendum)
[facility] DEPT Provider Note   CSN: [id number] Arrival date & time: 03/30/16  0725     History   Chief Complaint Chief Complaint  Patient presents with  . Generalized Body Aches  . Diarrhea  . Emesis    HPI Samuel [name] is a 60 y.o. male.  Patient c/o nausea, vomiting and diarrhea in past day. A few episodes of diarrhea, watery, and 1 episode vomiting, not bloody. Had intermittent mid abd cramping, now improved. Nausea persists. Feels generally weak. ?chest discomfort earlier, none currently. +non prod cough. No other recent cp. No sob. Subjective fever, and temp in ED 99.8. No urinary symptoms. No back or flank pain. No known ill contacts.      The history is provided by the patient and the spouse.  Diarrhea   Associated symptoms include vomiting, myalgias and cough. Pertinent negatives include no headaches.  Emesis   Associated symptoms include cough, diarrhea and myalgias. Pertinent negatives include no fever and no headaches.    Past Medical History:  Diagnosis Date  . Arthritis    Knees  . Diabetes mellitus without complication ([place])   . Goiter    Multinodular Goiter with partial thyroidectomey in [place]  1998.  Ultrasound confirming diagnosis in 01/17/2014    Patient Active Problem List   Diagnosis Date Noted  . Unspecified vitamin D deficiency 03/20/2014  . Dental cavities 03/20/2014  . IFG (impaired fasting glucose) 02/26/2014  . Multinodular goiter 02/26/2014  . Goiter     Past Surgical History:  Procedure Laterality Date  . APPENDECTOMY  2014  . THYROID SURGERY  1998   Enlarged and painful thyroid       Home Medications    Prior to Admission medications   Medication Sig Start Date End Date Taking? Authorizing Provider  acetaminophen (TYLENOL) 500 MG tablet Take 1 tablet (500 mg total) by mouth every 6 (six) hours as needed. 10/05/14   Baron Sane, PA-C  blood glucose meter kit and supplies KIT Check sugars twice daily, in morning  when fasting and in evening before dinner 02/08/16   Mack Hook, MD  celecoxib (CELEBREX) 200 MG capsule 1 capsule by mouth once daily Patient not taking: Reported on 02/08/2016 10/05/15   Mack Hook, MD  glucose blood test strip Use as instructed 02/08/16   Mack Hook, MD  metFORMIN (GLUCOPHAGE-XR) 500 MG 24 hr tablet 1 tab by mouth daily with dinner 02/08/16   Mack Hook, MD  terbinafine (LAMISIL) 250 MG tablet Take 1 tablet (250 mg total) by mouth daily. 02/08/16   Mack Hook, MD    Family History Family History  Problem Relation Age of Onset  . Arthritis Sister   . Arthritis Brother   . Prostate cancer Brother     Social History Social History  Substance Use Topics  . Smoking status: Never Smoker  . Smokeless tobacco: Never Used  . Alcohol use No     Allergies   Review of patient's allergies indicates no known allergies.   Review of Systems Review of Systems  Constitutional: Negative for fever.  HENT: Negative for sore throat.   Eyes: Negative for redness.  Respiratory: Positive for cough. Negative for shortness of breath.   Cardiovascular: Negative for leg swelling.  Gastrointestinal: Positive for diarrhea and vomiting.  Genitourinary: Negative for flank pain.  Musculoskeletal: Positive for myalgias. Negative for back pain, neck pain and neck stiffness.  Skin: Negative for rash.  Neurological: Negative for headaches.  Hematological: Does not bruise/bleed easily.  Psychiatric/Behavioral:  Negative for confusion.     Physical Exam Updated Vital Signs BP 122/83   Pulse 97   Temp 99.8 F (37.7 C) (Oral)   Resp 18   Wt 99.8 kg   SpO2 96%   BMI 30.05 kg/m   Physical Exam  Constitutional: He appears well-developed and well-nourished. No distress.  HENT:  Nose: Nose normal.  Mouth/Throat: Oropharynx is clear and moist.  Eyes: Conjunctivae are normal. No scleral icterus.  Neck: Neck supple. No tracheal deviation present.    No stiffness or rigidity  Cardiovascular: Normal rate, regular rhythm, normal heart sounds and intact distal pulses.  Exam reveals no gallop and no friction rub.   No murmur heard. Pulmonary/Chest: Effort normal and breath sounds normal. No accessory muscle usage. No respiratory distress.  Abdominal: Soft. Bowel sounds are normal. He exhibits no distension and no mass. There is no tenderness. There is no rebound and no guarding. No hernia.  Genitourinary:  Genitourinary Comments: No cva tenderness  Musculoskeletal: He exhibits no edema or tenderness.  Lymphadenopathy:    He has no cervical adenopathy.  Neurological: He is alert.  Skin: Skin is warm and dry. No rash noted. He is not diaphoretic.  Psychiatric: He has a normal mood and affect.  Nursing note and vitals reviewed.    ED Treatments / Results  Labs (all labs ordered are listed, but only abnormal results are displayed) Results for orders placed or performed during the hospital encounter of 03/30/16  CBC  Result Value Ref Range   WBC 9.1 4.0 - 10.5 K/uL   RBC 5.69 4.22 - 5.81 MIL/uL   Hemoglobin 15.4 13.0 - 17.0 g/dL   HCT 17.1 27.8 - 71.8 %   MCV 83.8 78.0 - 100.0 fL   MCH 27.1 26.0 - 34.0 pg   MCHC 32.3 30.0 - 36.0 g/dL   RDW 36.7 25.5 - 00.1 %   Platelets 226 150 - 400 K/uL  Comprehensive metabolic panel  Result Value Ref Range   Sodium 135 135 - 145 mmol/L   Potassium 4.7 3.5 - 5.1 mmol/L   Chloride 104 101 - 111 mmol/L   CO2 20 (L) 22 - 32 mmol/L   Glucose, Bld 131 (H) 65 - 99 mg/dL   BUN 17 6 - 20 mg/dL   Creatinine, Ser 6.42 0.61 - 1.24 mg/dL   Calcium 9.0 8.9 - 90.3 mg/dL   Total Protein 7.2 6.5 - 8.1 g/dL   Albumin 3.8 3.5 - 5.0 g/dL   AST 31 15 - 41 U/L   ALT 17 17 - 63 U/L   Alkaline Phosphatase 71 38 - 126 U/L   Total Bilirubin 1.6 (H) 0.3 - 1.2 mg/dL   GFR calc non Af Amer >60 >60 mL/min   GFR calc Af Amer >60 >60 mL/min   Anion gap 11 5 - 15  Troponin I  Result Value Ref Range   Troponin I  0.04 (HH) <0.03 ng/mL  Urinalysis, Routine w reflex microscopic (not at [facility])  Result Value Ref Range   Color, Urine YELLOW YELLOW   APPearance CLEAR CLEAR   Specific Gravity, Urine 1.029 1.005 - 1.030   pH 5.0 5.0 - 8.0   Glucose, UA NEGATIVE NEGATIVE mg/dL   Hgb urine dipstick NEGATIVE NEGATIVE   Bilirubin Urine NEGATIVE NEGATIVE   Ketones, ur NEGATIVE NEGATIVE mg/dL   Protein, ur NEGATIVE NEGATIVE mg/dL   Nitrite NEGATIVE NEGATIVE   Leukocytes, UA NEGATIVE NEGATIVE  I-stat troponin, ED  Result Value Ref Range  Troponin i, poc 0.00 0.00 - 0.08 ng/mL   Comment 3           Dg Chest 2 View  Result Date: 03/30/2016 CLINICAL DATA:  Cough and fever for few days. EXAM: CHEST  2 VIEW COMPARISON:  None. FINDINGS: The heart size and mediastinal contours are within normal limits. Both lungs are clear, except for slight scarring at the RIGHT base. The visualized skeletal structures are unremarkable. IMPRESSION: No active cardiopulmonary disease. Electronically Signed   By: Staci Righter M.D.   On: 03/30/2016 08:54    EKG  EKG Interpretation  Date/Time:  Wednesday March 30 2016 08:01:13 EDT Ventricular Rate:  105 PR Interval:    QRS Duration: 78 QT Interval:  300 QTC Calculation: 397 R Axis:   16 Text Interpretation:  Sinus tachycardia Nonspecific T wave abnormality No significant change since last tracing Confirmed by Ashok Cordia  MD, Lennette Bihari ([id number]) on 03/30/2016 8:27:18 AM       Radiology Dg Chest 2 View  Result Date: 03/30/2016 CLINICAL DATA:  Cough and fever for few days. EXAM: CHEST  2 VIEW COMPARISON:  None. FINDINGS: The heart size and mediastinal contours are within normal limits. Both lungs are clear, except for slight scarring at the RIGHT base. The visualized skeletal structures are unremarkable. IMPRESSION: No active cardiopulmonary disease. Electronically Signed   By: Staci Righter M.D.   On: 03/30/2016 08:54    Procedures Procedures (including critical care  time)  Medications Ordered in ED Medications  sodium chloride 0.9 % bolus 1,000 mL (not administered)  ondansetron (ZOFRAN) injection 4 mg (not administered)     Initial Impression / Assessment and Plan / ED Course  I have reviewed the triage vital signs and the nursing notes.  Pertinent labs & imaging results that were available during my care of the patient were reviewed by me and considered in my medical decision making (see chart for details).  Clinical Course    Iv ns bolus. zofran iv.  Labs.  Repeat troponin normal/not increasing.   Recheck pt, no chest pain. No abd pain. abd soft nt.  Pt tolerating po fluids. No nv. One additional diarrhea stool.  Recheck abd, no pain, abd is soft and non tender.   Given fever, body ache, nvd, suspect viral process.   Patient currently appears stable for d/c.     Final Clinical Impressions(s) / ED Diagnoses   Final diagnoses:  None    New Prescriptions New Prescriptions   No medications on file         Lajean Saver, MD 03/30/16 1423

## 2016-03-30 NOTE — ED Notes (Signed)
Dr. Ashok Cordia made aware of troponin 0.04.

## 2017-03-23 ENCOUNTER — Emergency Department (HOSPITAL_COMMUNITY)
Admission: EM | Admit: 2017-03-23 | Discharge: 2017-03-23 | Disposition: A | Discharge status: Home / Self Care | Payer: Medicaid Other | Attending: Physician Assistant | Admitting: Physician Assistant

## 2017-03-23 ENCOUNTER — Encounter (HOSPITAL_COMMUNITY): Payer: Self-pay

## 2017-03-23 DIAGNOSIS — Z79899 Other long term (current) drug therapy: Secondary | ICD-10-CM | POA: Diagnosis not present

## 2017-03-23 DIAGNOSIS — M25562 Pain in left knee: Secondary | ICD-10-CM

## 2017-03-23 DIAGNOSIS — M25561 Pain in right knee: Secondary | ICD-10-CM | POA: Diagnosis not present

## 2017-03-23 DIAGNOSIS — E119 Type 2 diabetes mellitus without complications: Secondary | ICD-10-CM | POA: Diagnosis not present

## 2017-03-23 DIAGNOSIS — Z7984 Long term (current) use of oral hypoglycemic drugs: Secondary | ICD-10-CM | POA: Insufficient documentation

## 2017-03-23 DIAGNOSIS — G8929 Other chronic pain: Secondary | ICD-10-CM | POA: Diagnosis not present

## 2017-03-23 MED ORDER — MELOXICAM 7.5 MG PO TABS: 7.5000 mg | ORAL_TABLET | Freq: Every day | ORAL | Status: DC

## 2017-03-23 MED ORDER — DICLOFENAC SODIUM 1 % TD GEL: 2.0000 g | Freq: Three times a day (TID) | TRANSDERMAL | 0 refills | Status: DC | PRN

## 2017-03-23 MED ORDER — MELOXICAM 7.5 MG PO TABS: 7.5000 mg | ORAL_TABLET | Freq: Every day | ORAL | 0 refills | Status: DC | PRN

## 2017-03-23 NOTE — ED Triage Notes (Addendum)
Pt reports bilateral knee pain and dry skin. He reports some relief with bengay. Pt reports painful to walk. Knees noted to have dry skin. Pt aoX4. No swelling of joints noted. Pt has hx of arthritis.

## 2017-03-23 NOTE — Discharge Instructions (Signed)
Please see the information and instructions below regarding your visit.  Your diagnoses today include:  1. Chronic pain of both knees    We think that your symptoms are most likely from arthritis of your knees. This is a condition that occurs from where on your joints over time. This needs to be followed by a primary care provider for pain control or referral if needed if disease progresses.  Tests performed today include: See side panel of your discharge paperwork for testing performed today. Vital signs are listed at the bottom of these instructions.   Medications prescribed:    You are prescribed Mobic, a non-steroidal anti-inflammatory agent (NSAID) for pain. You may take 7.5 mg every da  as needed for pain. If still requiring this medication around the clock for acute pain after 7 days, please see your primary healthcare provider.  You may combine this medication with Tylenol, 650 mg every 6 hours, so you are receiving something for pain every 3 hours.  This is not a long-term medication unless under the care and direction of your primary provider. Taking this medication long-term and not under the supervision of a healthcare provider could increase the risk of stomach ulcers, kidney problems, and cardiovascular problems such as high blood pressure.    Take any prescribed medications only as prescribed, and any over the counter medications only as directed on the packaging.  Home care instructions:  Please follow any educational materials contained in this packet.   Follow-up instructions: Please follow-up with your primary care provider in 2 weeks for further evaluation of your knee pain.  Return instructions:  Please return to the Emergency Department if you experience worsening symptoms.  Please return for fever, chills, joint swelling. Please return if you have any other emergent concerns.   Your vital signs today were: BP 109/73 (BP Location: Left Arm)    Pulse 67    Temp  98.2 F (36.8 C) (Oral)    Resp 16    SpO2 100%  If your blood pressure (BP) was elevated on multiple readings during this visit above 130 for the top number or above 80 for the bottom number, please have this repeated by your primary care provider within one month. --------------  Thank you for allowing [place] to participate in your care today.

## 2017-03-23 NOTE — ED Provider Notes (Signed)
[facility] DEPT Provider Note   CSN: [id number] Arrival date & time: 03/23/17  1214     History   Chief Complaint Chief Complaint  Patient presents with  . Knee Pain    HPI Trapper Meech is a 61 y.o. male.  HPI   Patient is a 61 year old male with approximately 20 year history of bilateral knee pain presenting for a flare of knee pain within the last 2 weeks. Patient reports he was previously diagnosed with arthritis and given a prescription for medicine he cannot recall the name of. Patient reports the pain is with range of motion and ambulation. No pain at rest. Pain is approximately 8-9 out of 10 at its worst. Patient has tried acetaminophen with minimal relief. Patient reports that Bengay cream made the pain worse. No trauma to the lower extremities recently. 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 Patient has not seen a provider in approximately 6 months. Patient denies fever, chills, knee swelling, erythema, or rash. Patient reports some associated shoulder pain. No history of trauma to the shoulder.  Past Medical History:  Diagnosis Date  . Arthritis    Knees  . Diabetes mellitus without complication ([place])   . Goiter    Multinodular Goiter with partial thyroidectomey in [place]  1998.  Ultrasound confirming diagnosis in 01/17/2014    Patient Active Problem List   Diagnosis Date Noted  . Unspecified vitamin D deficiency 03/20/2014  . Dental cavities 03/20/2014  . IFG (impaired fasting glucose) 02/26/2014  . Multinodular goiter 02/26/2014  . Goiter     Past Surgical History:  Procedure Laterality Date  . APPENDECTOMY  2014  . THYROID SURGERY  1998   Enlarged and painful thyroid       Home Medications    Prior to Admission medications   Medication Sig Start Date End Date Taking? Authorizing Provider  acetaminophen (TYLENOL) 500 MG tablet Take 1 tablet (500 mg total) by mouth every 6 (six) hours as  needed. 10/05/14   Piepenbrink, Anderson Malta, PA-C  blood glucose meter kit and supplies KIT Check sugars twice daily, in morning when fasting and in evening before dinner 02/08/16   Mack Hook, MD  glucose blood test strip Use as instructed 02/08/16   Mack Hook, MD  meloxicam (MOBIC) 7.5 MG tablet Take 1 tablet (7.5 mg total) by mouth daily as needed for pain. 03/23/17   Clayton Bibles, PA-C  metFORMIN (GLUCOPHAGE-XR) 500 MG 24 hr tablet 1 tab by mouth daily with dinner 02/08/16   Mack Hook, MD  terbinafine (LAMISIL) 250 MG tablet Take 1 tablet (250 mg total) by mouth daily. 02/08/16   Mack Hook, MD    Family History Family History  Problem Relation Age of Onset  . Arthritis Sister   . Arthritis Brother   . Prostate cancer Brother     Social History Social History  Substance Use Topics  . Smoking status: Never Smoker  . Smokeless tobacco: Never Used  . Alcohol use No     Allergies   Patient has no known allergies.   Review of Systems Review of Systems  Constitutional: Negative for chills and fever.  Cardiovascular: Negative for leg swelling.  Musculoskeletal: Positive for arthralgias. Negative for gait problem, joint swelling and myalgias.  Skin: Negative for rash.     Physical Exam Updated Vital Signs BP 109/73 (BP Location: Left Arm)   Pulse 67   Temp 98.2 F (36.8 C) (Oral)   Resp  16   SpO2 100%   Physical Exam  Constitutional: He appears well-developed and well-nourished. No distress.  Sitting comfortably in chair.  HENT:  Head: Normocephalic and atraumatic.  Eyes: Conjunctivae are normal. Right eye exhibits no discharge. Left eye exhibits no discharge.  EOMs normal to gross examination.  Neck: Normal range of motion.  Cardiovascular: Normal rate and regular rhythm.   Intact, 2+ radial pulse.  Pulmonary/Chest:  Normal respiratory effort. Patient converses comfortably. No audible wheeze or stridor.  Abdominal: He exhibits no  distension.  Musculoskeletal: Normal range of motion.  Mild varus deformityof the knees bilaterally. No joint line tenderness of bilateral knees. No tenderness of suprapatellar, prepatellar, infrapatellar, peds anserine bursa. No tenderness, catching with range of motion or McMurray's test. No laxity with varus/valgus stress. Mild pain with extension of knees bilaterally.  Neurological: He is alert.  Cranial nerves intact to gross observation. Gait normal. Steps limited due to knee discomfort.  Skin: Skin is warm and dry. He is not diaphoretic.  Psychiatric: He has a normal mood and affect. His behavior is normal. Judgment and thought content normal.  Nursing note and vitals reviewed.    ED Treatments / Results  Labs (all labs ordered are listed, but only abnormal results are displayed) Labs Reviewed - No data to display  EKG  EKG Interpretation None       Radiology No results found.  Procedures Procedures (including critical care time)  Medications Ordered in ED Medications - No data to display   Initial Impression / Assessment and Plan / ED Course  I have reviewed the triage vital signs and the nursing notes.  Pertinent labs & imaging results that were available during my care of the patient were reviewed by me and considered in my medical decision making (see chart for details).   Final Clinical Impressions(s) / ED Diagnoses   Final diagnoses:  Chronic pain of both knees   MDM  Patient is a 61 year old male with acute on chronic bilateral knee pain, likely osteoarthritis. 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 Based on clinical exam of full range of motion of the knee, and no erythema or swelling, in addition to bilateral complaints, I doubt this is septic arthritis.  Doubt gout, due to history of bilateral involvement of these large joints and no erythema or swelling. Patient has no history of  gout. I discussed the chronicity of arthritis with patient and he was in understanding of this condition, and stated it had been discussed by his PCP but reporting that he needs relief at this time. We discussed Mobic versus Diclofenac gel therapy and patient elected Mobic. I proceeded in prescribing 7 days of Mobic for relief.  Patient reports that he can get an appointment with a primary care provider he has seen previously for continued management of pain. I discussed the risks of long-term NSAID use for this patient reported I would like him to be under the care of a primary care provider for continued management. Patient is in agreement with plan of care.  New Prescriptions New Prescriptions   MELOXICAM (MOBIC) 7.5 MG TABLET    Take 1 tablet (7.5 mg total) by mouth daily as needed for pain.     Tamala Julian 03/23/17 2232    Macarthur Critchley, MD 04/01/17 [phone]

## 2017-03-23 NOTE — ED Notes (Signed)
Patient verbalized understanding of discharge instructions and denies any further needs or questions at this time. VS stable. Patient ambulatory with steady gait, escorted to ED entrance in wheelchair.   

## 2017-04-14 ENCOUNTER — Emergency Department (HOSPITAL_COMMUNITY)
Admission: EM | Admit: 2017-04-14 | Discharge: 2017-04-14 | Disposition: A | Discharge status: Home / Self Care | Payer: Medicaid Other | Attending: Emergency Medicine | Admitting: Emergency Medicine

## 2017-04-14 ENCOUNTER — Emergency Department (HOSPITAL_COMMUNITY): Payer: Medicaid Other

## 2017-04-14 ENCOUNTER — Encounter (HOSPITAL_COMMUNITY): Payer: Self-pay | Admitting: Neurology

## 2017-04-14 DIAGNOSIS — E119 Type 2 diabetes mellitus without complications: Secondary | ICD-10-CM | POA: Diagnosis not present

## 2017-04-14 DIAGNOSIS — M25561 Pain in right knee: Secondary | ICD-10-CM | POA: Insufficient documentation

## 2017-04-14 DIAGNOSIS — M25569 Pain in unspecified knee: Secondary | ICD-10-CM

## 2017-04-14 MED ORDER — IBUPROFEN 400 MG PO TABS
600.0000 mg | ORAL_TABLET | Freq: Once | ORAL | Status: AC
  Administered 2017-04-14: 600 mg via ORAL
  Filled 2017-04-14: qty 1

## 2017-04-14 MED ORDER — NAPROXEN 500 MG PO TABS: 500.0000 mg | ORAL_TABLET | Freq: Two times a day (BID) | ORAL | 0 refills | Status: DC | PRN

## 2017-04-14 NOTE — ED Triage Notes (Signed)
Pt reports right knee pain for several weeks. Denies fall or recent injury. Hurts to walk.

## 2017-04-14 NOTE — Discharge Instructions (Signed)
Please take Naproxen (anti-inflammatory) as prescribed as needed for pain--take with food to prevent GI upset. Do not take while taking Celebrex or Meloxicam (Mobic). Please call to schedule a follow up appointment with your primary care doctor in 5 days, as well as call to schedule a follow up appointment with your orthopedist/or the orthopedist's number provided today for follow up. Return to the ER if you experience fevers, chills, unexplained weight loss, dizziness, chest pain, shortness of breath, abdominal pain, nausea, vomiting, diarrhea, blood in urine or stool, redness/swelling/warmth to area, increased pain, numbness/tingling, worsening symptoms, or any additional concerns.   Self-care for Knee Injury: Rest your knee. Rest helps decrease swelling and allows the injury to heal. You can do gentle range of motion (ROM) exercises as directed. This will prevent stiffness. Apply ice to your knee for 20 minutes four times a day. Use an ice pack, or put crushed ice in a plastic bag. Cover it with a towel. Do not apply ice directly to the skin. Ice helps prevent tissue damage and decreases swelling and pain. Apply compression to your knee as directed. You may need to wear an elastic bandage. This helps keep your injured knee from moving too much while it heals. You can loosen or tighten the elastic bandage to make it comfortable. It should be tight enough for you to feel support. It should not be so tight that it causes your toes to be numb or tingly. If you are wearing an elastic bandage, take it off and rewrap it once a day. Elevate your knee above the level of your heart as often as you can. This will help decrease swelling and pain. Prop your leg on pillows or blankets to keep it elevated comfortably. Do not put pillows directly behind your knee.

## 2017-04-14 NOTE — ED Provider Notes (Signed)
[facility] Emergency Department Provider Note  ED Clinical Impression  Acute knee pain, unspecified laterality  History   Chief Complaint Knee Pain   HPI  Patient is a 61 y.o. male with a PMH of DM and arthritis who presents to ED for right knee pain, states he has has this pain for approx 20 years but it comes and goes, worse the past 2 days after starting new job requiring a lot of standing and walking, describes as a dull ache, no radiation, has tried Celebrex with mild relief. Denies recent trauma, fall, or known injury to area. Denies redness, swelling, or warmth to area. No previous knee surgeries. Denies fevers, chills, unexplained weight loss, dizziness, CP, SOB, cough, abd pain, n/v/d, extremity numbness or tingling, or any additional concerns. No hx of DVT/PE. No anticoagulant use.   Past Medical History:  Diagnosis Date  . Arthritis    Knees  . Diabetes mellitus without complication ([place])   . Goiter    Multinodular Goiter with partial thyroidectomey in [place]  1998.  Ultrasound confirming diagnosis in 01/17/2014    Past Surgical History:  Procedure Laterality Date  . APPENDECTOMY  2014  . THYROID SURGERY  1998   Enlarged and painful thyroid    Current Outpatient Rx  . Order #: [id number] Class: Print  . Order #: [id number] Class: Print  . Order #: [id number] Class: Normal  . Order #: [id number] Class: Print  . Order #: [id number] Class: Normal  . Order #: [id number] Class: Normal    Allergies Patient has no known allergies.  Family History  Problem Relation Age of Onset  . Arthritis Sister   . Arthritis Brother   . Prostate cancer Brother     Social History Social History  Substance Use Topics  . Smoking status: Never Smoker  . Smokeless tobacco: Never Used  . Alcohol use No    Review of Systems  Constitutional: Negative for fever, chills, or unexplained weight loss. Eyes: Negative for visual changes. Cardiovascular: Negative for chest pain, palpitations, or  extremity swelling. Respiratory: Negative for shortness of breath or cough. Gastrointestinal: Negative for abdominal pain, nausea, vomiting, or diarrhea. Musculoskeletal: +right knee pain. Negative for back pain . Skin: Negative for rash. Neurological: Negative for headaches, dizziness, focal weakness, or numbness/tingling.  Physical Exam   VITAL SIGNS:   ED Triage Vitals  Enc Vitals Group     BP 04/14/17 1123 122/65     Pulse Rate 04/14/17 1123 61     Resp 04/14/17 1123 17     Temp 04/14/17 1123 98.1 F (36.7 C)     Temp Source 04/14/17 1123 Oral     SpO2 04/14/17 1123 100 %     Weight 04/14/17 1123 200 lb (90.7 kg)     Height 04/14/17 1123 6' (1.829 m)     Head Circumference --      Peak Flow --      Pain Score 04/14/17 1130 10     Pain Loc --      Pain Edu? --      Excl. in [place]? --     Constitutional: Alert and oriented. Well appearing and in no respiratory apparent distress. Eyes: PERRL, EOMI, Conjunctivae normal ENT      Head: Normocephalic and atraumatic.      Mouth/Throat: Normal voice, handling secretions normally.      Neck: Supple, no nuchal signs. Cardiovascular: Normal S1 S2, regular rhythm, normal rate. Normal and symmetric distal pulses are present in all extremities. Respiratory: Breath sounds clear  and equal bilaterally. No wheezes, rales, or rhonchi. Normal respiratory effort.  Gastrointestinal: Abdomen soft and nontender. No rebound or guarding.  Back: No midline or paraspinal muscle tenderness.  Musculoskeletal: + R anterior knee very mildly ttp, full active ROM; no sig effusion, no STS; skin intact without erythema/warmth/ecchymosis to area. No medial or lateral joint line tenderness. Negative anterior posterior drawer, fibular head nontender, able to flex 90 degrees. Sharp and dull sensation grossly intact to bilateral LE, dorsalis pedis and posterior tibial pulses 2+, cap refill < 2sec. No calf pain, erythema, or generalized swelling noted. No acute signs  in all other extremities. Neurologic: Speech clear. Alert and appropriate, no gross focal neurologic deficits are appreciated. Gait steady with ambulation. Equal strength in all four extremities. Extremities neurovascularly intact.  Skin: Skin is warm, dry, and intact. No rash noted. Psychiatric: Mood and affect are normal. Speech and behavior are normal.  Labs   Labs Reviewed - No data to display  Radiology   DG Knee Complete 4 Views Right  Final Result       ED Course, Assessment and Plan   Patient is a 61 y/o M, afebrile, who presents to ED for acute on chronic right knee pain. Doubt septic joint, gout, or osteomyelitis. No evidence of neurovascular compromise. Compartments soft, doubt compartment syndrome. Doubt DVT. Will get xray for acute injury, patient requesting Ibuprofen, will give dose here. Last Cr 0.85 on 03/30/16, denies hx of kidney injury or disease.  1:34 PM Xray with degenerative joint disease. No acute abnormalities. Patient updated on results. Will plan to dc with symptomatic tx , PCP and orthopedic follow up; patient amenable to this plan. Discussed results, discharge instructions, rx and safety, return precautions, and follow up. Pt verbalizes understanding using verbal teachback and agrees with plan, denies any additional concerns.   Previous chart, nursing notes, and vital signs reviewed.    Pertinent labs & imaging results that were available during my care of the patient were reviewed by me and considered in my medical decision making (see chart for details).    Arora Coakley, Bernadene Bell, NP 04/18/17 Leroy Kennedy, MD 04/19/17 [phone]

## 2017-04-25 ENCOUNTER — Emergency Department (HOSPITAL_COMMUNITY)
Admission: EM | Admit: 2017-04-25 | Discharge: 2017-04-26 | Disposition: A | Discharge status: Home / Self Care | Payer: Medicaid Other | Attending: Emergency Medicine | Admitting: Emergency Medicine

## 2017-04-25 ENCOUNTER — Encounter (HOSPITAL_COMMUNITY): Payer: Self-pay

## 2017-04-25 DIAGNOSIS — Y999 Unspecified external cause status: Secondary | ICD-10-CM | POA: Diagnosis not present

## 2017-04-25 DIAGNOSIS — Y9389 Activity, other specified: Secondary | ICD-10-CM | POA: Insufficient documentation

## 2017-04-25 DIAGNOSIS — Z79899 Other long term (current) drug therapy: Secondary | ICD-10-CM | POA: Diagnosis not present

## 2017-04-25 DIAGNOSIS — S61204A Unspecified open wound of right ring finger without damage to nail, initial encounter: Secondary | ICD-10-CM | POA: Diagnosis not present

## 2017-04-25 DIAGNOSIS — S6991XA Unspecified injury of right wrist, hand and finger(s), initial encounter: Secondary | ICD-10-CM | POA: Diagnosis present

## 2017-04-25 DIAGNOSIS — W230XXA Caught, crushed, jammed, or pinched between moving objects, initial encounter: Secondary | ICD-10-CM | POA: Insufficient documentation

## 2017-04-25 DIAGNOSIS — S61209A Unspecified open wound of unspecified finger without damage to nail, initial encounter: Secondary | ICD-10-CM

## 2017-04-25 DIAGNOSIS — Z7984 Long term (current) use of oral hypoglycemic drugs: Secondary | ICD-10-CM | POA: Insufficient documentation

## 2017-04-25 DIAGNOSIS — Z23 Encounter for immunization: Secondary | ICD-10-CM | POA: Insufficient documentation

## 2017-04-25 DIAGNOSIS — E119 Type 2 diabetes mellitus without complications: Secondary | ICD-10-CM | POA: Diagnosis not present

## 2017-04-25 DIAGNOSIS — Y92810 Car as the place of occurrence of the external cause: Secondary | ICD-10-CM | POA: Insufficient documentation

## 2017-04-25 NOTE — ED Provider Notes (Signed)
[facility] DEPT Provider Note   CSN: [id number] Arrival date & time: 04/25/17  2204     History   Chief Complaint Chief Complaint  Patient presents with  . Finger Injury    HPI Samuel [name] is a 61 y.o. male who presents a with chief complaint acute onset, constant skin avulsion of the fingertip of the right fourth digit which occurred yesterday. He states that at around noon yesterday he slammed his car door on his fingertip, resulting in a wound. He denies any pain to the finger or the fingertip. He denies numbness, tingling, or weakness. He states that the car door was only in contact with his fingertip and nowhere else on his finger or hand. He has not tried anything for his symptoms. Tetanus is not up-to-date. He is right hand dominant. He denies fevers, chills, swelling, erythema, or abnormal drainage from the wound.  The history is provided by the patient.    Past Medical History:  Diagnosis Date  . Arthritis    Knees  . Diabetes mellitus without complication ([place])   . Goiter    Multinodular Goiter with partial thyroidectomey in [place]  1998.  Ultrasound confirming diagnosis in 01/17/2014    Patient Active Problem List   Diagnosis Date Noted  . Unspecified vitamin D deficiency 03/20/2014  . Dental cavities 03/20/2014  . IFG (impaired fasting glucose) 02/26/2014  . Multinodular goiter 02/26/2014  . Goiter     Past Surgical History:  Procedure Laterality Date  . APPENDECTOMY  2014  . THYROID SURGERY  1998   Enlarged and painful thyroid       Home Medications    Prior to Admission medications   Medication Sig Start Date End Date Taking? Authorizing Provider  acetaminophen (TYLENOL) 500 MG tablet Take 1 tablet (500 mg total) by mouth every 6 (six) hours as needed. 10/05/14   Piepenbrink, Anderson Malta, PA-C  blood glucose meter kit and supplies KIT Check sugars twice daily, in morning when fasting and in evening before dinner 02/08/16   Mack Hook, MD    glucose blood test strip Use as instructed 02/08/16   Mack Hook, MD  meloxicam (MOBIC) 7.5 MG tablet Take 1 tablet (7.5 mg total) by mouth daily as needed for pain. 03/23/17   Clayton Bibles, PA-C  metFORMIN (GLUCOPHAGE-XR) 500 MG 24 hr tablet 1 tab by mouth daily with dinner 02/08/16   Mack Hook, MD  naproxen (NAPROSYN) 500 MG tablet Take 1 tablet (500 mg total) by mouth 2 (two) times daily as needed (pain). 04/14/17   Wojeck, Bernadene Bell, NP  terbinafine (LAMISIL) 250 MG tablet Take 1 tablet (250 mg total) by mouth daily. 02/08/16   Mack Hook, MD    Family History Family History  Problem Relation Age of Onset  . Arthritis Sister   . Arthritis Brother   . Prostate cancer Brother     Social History Social History  Substance Use Topics  . Smoking status: Never Smoker  . Smokeless tobacco: Never Used  . Alcohol use No     Allergies   Patient has no known allergies.   Review of Systems Review of Systems  Constitutional: Negative for chills and fever.  Musculoskeletal: Negative for arthralgias and myalgias.  Skin: Positive for wound.  Neurological: Negative for weakness and numbness.     Physical Exam Updated Vital Signs BP 117/75   Pulse 73   Temp 98.6 F (37 C) (Oral)   Resp 17   Ht '6\' 2"'$  (1.88 m)  Wt 82.6 kg (182 lb)   SpO2 99%   BMI 23.37 kg/m   Physical Exam  Constitutional: He appears well-developed and well-nourished. No distress.  Resting comfortably in chair  HENT:  Head: Normocephalic and atraumatic.  Eyes: Conjunctivae are normal. Right eye exhibits no discharge. Left eye exhibits no discharge.  Neck: No JVD present. No tracheal deviation present.  Cardiovascular: Normal rate and intact distal pulses.   2+ radial pulses bilaterally  Pulmonary/Chest: Effort normal.  Abdominal: He exhibits no distension.  Musculoskeletal: Normal range of motion. He exhibits no edema or tenderness.  Normal range of motion of the digits of the  bilateral hands. 5/5 strength of the bilateral hands, wrists, and digit with flexion and extension against resistance. Good grip strength. No deformity, crepitus, or swelling noted. Patient has a 1 cm skin avulsion along the palmar aspect of the right fourth digit. See images below. Small amount of subcutaneous tissue is extruding from this wound. No bleeding. The area is nontender to palpation. No underlying crepitus.  Neurological: He is alert. No sensory deficit.  Fluent speech, no facial droop, sensation intact to soft touch of upper extremities  Skin: Skin is warm and dry. Capillary refill takes less than 2 seconds. No erythema.  See description of wound as in the musculoskeletal section  Psychiatric: He has a normal mood and affect. His behavior is normal.  Nursing note and vitals reviewed.      ED Treatments / Results  Labs (all labs ordered are listed, but only abnormal results are displayed) Labs Reviewed - No data to display  EKG  EKG Interpretation None       Radiology No results found.  Procedures Procedures (including critical care time)  Medications Ordered in ED Medications  Tdap (BOOSTRIX) injection 0.5 mL (0.5 mLs Intramuscular Given 04/26/17 0019)     Initial Impression / Assessment and Plan / ED Course  I have reviewed the triage vital signs and the nursing notes.  Pertinent labs & imaging results that were available during my care of the patient were reviewed by me and considered in my medical decision making (see chart for details).    Patient with avulsion of skin of the fingertip of the right fourth digit. Afebrile, vital signs are stable. He is neurovascularly intact. The wound occurred >24 hours ago. No pain or bleeding noted. Highly doubt fracture or dislocation in the absence of pain and given the description of the mechanism of injury; there does not appear to be any bony involvement. Given the length of time since the onset of the injury, the  wound will heal by secondary intention and is not amenable to repair via suture. Patient's tetanus was updated. The wound was extensively cleaned and a dressing was applied with Xeroform gauze. Discussed wound care extensively with the patient. Recommend follow-up with primary care physician for reevaluation and wound check. Discussed indications for return to the ED. Pt verbalized understanding of and agreement with plan and is safe for discharge home at this time.   Final Clinical Impressions(s) / ED Diagnoses   Final diagnoses:  Avulsion of skin of finger without complication, initial encounter    New Prescriptions Discharge Medication List as of 04/26/2017 12:03 AM       Renita Papa, PA-C 04/26/17 0030    Daleen Bo, MD 04/26/17 1332

## 2017-04-25 NOTE — ED Triage Notes (Signed)
Onset last night pt closed right ring finger in car door.  Very painful, small amount of skin protruding out of tip of finger.  No bleeding.

## 2017-04-26 MED ORDER — TETANUS-DIPHTH-ACELL PERTUSSIS 5-2.5-18.5 LF-MCG/0.5 IM SUSP
0.5000 mL | Freq: Once | INTRAMUSCULAR | Status: AC
  Administered 2017-04-26: 0.5 mL via INTRAMUSCULAR
  Filled 2017-04-26: qty 0.5

## 2017-04-26 NOTE — Discharge Instructions (Signed)
Keep the dressing on for the next 3 or 4 days. Keep this dressing clean and dry. After that you may remove the dressing and apply antibiotic ointment with a regular Band-Aid on top daily. The wound should start healing and close up within 3 weeks or so. You may take ibuprofen or Tylenol as needed for pain. Keep the area clean and dry. Follow-up with your primary care physician if symptoms persist. Return to the ED immediately if any concerning signs or symptoms develop such as fever, redness, swelling, or abnormal drainage from the area.

## 2017-04-26 NOTE — ED Notes (Signed)
Pt stable, ambulatory, states understanding of discharge instructions 

## 2017-09-23 ENCOUNTER — Encounter (HOSPITAL_COMMUNITY): Payer: Self-pay | Admitting: Emergency Medicine

## 2017-09-23 ENCOUNTER — Emergency Department (HOSPITAL_COMMUNITY): Payer: Medicaid Other

## 2017-09-23 ENCOUNTER — Emergency Department (HOSPITAL_COMMUNITY)
Admission: EM | Admit: 2017-09-23 | Discharge: 2017-09-23 | Disposition: A | Discharge status: Home / Self Care | Payer: Medicaid Other | Attending: Emergency Medicine | Admitting: Emergency Medicine

## 2017-09-23 ENCOUNTER — Other Ambulatory Visit: Payer: Self-pay

## 2017-09-23 DIAGNOSIS — Z79899 Other long term (current) drug therapy: Secondary | ICD-10-CM | POA: Diagnosis not present

## 2017-09-23 DIAGNOSIS — M2550 Pain in unspecified joint: Secondary | ICD-10-CM

## 2017-09-23 DIAGNOSIS — Z7984 Long term (current) use of oral hypoglycemic drugs: Secondary | ICD-10-CM | POA: Insufficient documentation

## 2017-09-23 DIAGNOSIS — E119 Type 2 diabetes mellitus without complications: Secondary | ICD-10-CM | POA: Diagnosis not present

## 2017-09-23 DIAGNOSIS — M159 Polyosteoarthritis, unspecified: Secondary | ICD-10-CM | POA: Diagnosis not present

## 2017-09-23 DIAGNOSIS — M25561 Pain in right knee: Secondary | ICD-10-CM | POA: Diagnosis present

## 2017-09-23 MED ORDER — METHOCARBAMOL 500 MG PO TABS: 500.0000 mg | ORAL_TABLET | Freq: Three times a day (TID) | ORAL | 0 refills | Status: DC | PRN

## 2017-09-23 MED ORDER — NAPROXEN 500 MG PO TABS: 500.0000 mg | ORAL_TABLET | Freq: Two times a day (BID) | ORAL | 0 refills | Status: DC | PRN

## 2017-09-23 NOTE — ED Notes (Signed)
Patient able to ambulate independently  

## 2017-09-23 NOTE — ED Triage Notes (Addendum)
Pt c/o bilateral knee pain, left elbow and bilateral hands pain-- states was seen for same in past-- it got better, now is hurting again-- no obvious swelling,  Pt works at [employer]- states is he is sitting his legs do not hurt, if he is standing they hurt

## 2017-09-23 NOTE — ED Notes (Signed)
Patient transported to X-ray 

## 2017-09-23 NOTE — ED Provider Notes (Signed)
[facility] EMERGENCY DEPARTMENT Provider Note   CSN: [id number] Arrival date & time: 09/23/17  1351     History   Chief Complaint Chief Complaint  Patient presents with  . Joint Pain    HPI Samuel [name] is a 62 y.o. male with a hx of T2DM and arthritis who presents to the ED with complaints of pain in multiple areas for varying durations of time. Reports all locations of pain occur intermittently- specifically with movement of that location, and improve with rest. He is not in pain at present. Relays bilateral knee pain x 2-3 weeks, pain to all upper extremity digit (DIP/PIP) x 1 week, and left lower back pain since this AM. States he has hx of similar which he has seen [facility] for, has not followed up with them recently. Has not tried any OTC medicine. Denies fever, chills, swelling, erythema, numbness, tingling, weakness, urinary sxs, abdominal pain, or N/V/D. Denies incontinence to bowel/bladder, saddle anesthesia, IV drug use, or hx of cancer.   HPI  Past Medical History:  Diagnosis Date  . Arthritis    Knees  . Diabetes mellitus without complication ([place])   . Goiter    Multinodular Goiter with partial thyroidectomey in [place]  1998.  Ultrasound confirming diagnosis in 01/17/2014    Patient Active Problem List   Diagnosis Date Noted  . Unspecified vitamin D deficiency 03/20/2014  . Dental cavities 03/20/2014  . IFG (impaired fasting glucose) 02/26/2014  . Multinodular goiter 02/26/2014  . Goiter     Past Surgical History:  Procedure Laterality Date  . APPENDECTOMY  2014  . THYROID SURGERY  1998   Enlarged and painful thyroid       Home Medications    Prior to Admission medications   Medication Sig Start Date End Date Taking? Authorizing Provider  acetaminophen (TYLENOL) 500 MG tablet Take 1 tablet (500 mg total) by mouth every 6 (six) hours as needed. 10/05/14   Piepenbrink, Anderson Malta, PA-C  blood glucose meter kit and supplies KIT  Check sugars twice daily, in morning when fasting and in evening before dinner 02/08/16   Mack Hook, MD  glucose blood test strip Use as instructed 02/08/16   Mack Hook, MD  meloxicam (MOBIC) 7.5 MG tablet Take 1 tablet (7.5 mg total) by mouth daily as needed for pain. 03/23/17   Clayton Bibles, PA-C  metFORMIN (GLUCOPHAGE-XR) 500 MG 24 hr tablet 1 tab by mouth daily with dinner 02/08/16   Mack Hook, MD  naproxen (NAPROSYN) 500 MG tablet Take 1 tablet (500 mg total) by mouth 2 (two) times daily as needed (pain). 04/14/17   Wojeck, Bernadene Bell, NP  terbinafine (LAMISIL) 250 MG tablet Take 1 tablet (250 mg total) by mouth daily. 02/08/16   Mack Hook, MD    Family History Family History  Problem Relation Age of Onset  . Arthritis Sister   . Arthritis Brother   . Prostate cancer Brother     Social History Social History   Tobacco Use  . Smoking status: Never Smoker  . Smokeless tobacco: Never Used  Substance Use Topics  . Alcohol use: No    Alcohol/week: 0.0 oz  . Drug use: No     Allergies   Patient has no known allergies.   Review of Systems Review of Systems  Constitutional: Negative for chills and fever.  Gastrointestinal: Negative for abdominal pain, diarrhea, nausea and vomiting.  Genitourinary: Negative for dysuria, hematuria and urgency.  Musculoskeletal: Positive for arthralgias and  back pain. Negative for joint swelling.  Skin: Negative for color change.  Neurological: Negative for weakness and numbness.       Negative for incontinence to bowel/bladder or saddle anesthesia.     Physical Exam Updated Vital Signs BP (!) 105/93   Pulse (!) 59   Temp 98.9 F (37.2 C) (Oral)   Resp 16   SpO2 95%   Physical Exam  Constitutional: He appears well-developed and well-nourished.  Non-toxic appearance. No distress.  HENT:  Head: Normocephalic and atraumatic.  Eyes: Conjunctivae are normal. Right eye exhibits no discharge. Left eye exhibits  no discharge.  Neck: No spinous process tenderness and no muscular tenderness present.  Cardiovascular:  Pulses:      Radial pulses are 2+ on the right side, and 2+ on the left side.       Dorsalis pedis pulses are 2+ on the right side, and 2+ on the left side.  Abdominal: Soft. Normal appearance. He exhibits no distension. There is no tenderness.  Musculoskeletal:  There is no obvious deformity appreciable swelling, erythema, ecchymosis, or warmth.  No evident effusion of any joints.  Upper extremities: Patient has full range of motion to all joints.  There is no bony tenderness.  Specifically all digits are nontender. Back: No midline tenderness.  No paraspinal muscle tenderness. Lower extremities: Patient has full range of motion to all joints.  There is no bony tenderness.  Specifically the knees are nontender.  Neurological: He is alert.  Clear speech.  Patient has 5 out of 5 grip strength bilaterally.  He is able to perform OK sign, thumbs up, and cross 2nd/3rd digits bilaterally.  He has 5 out of 5 strength with plantar and dorsiflexion bilaterally.  Sensation is grossly intact to bilateral upper and lower extremities.  Patellar DTRs are 2+ and symmetric.  Gait is intact.  Skin: Capillary refill takes less than 2 seconds.  Psychiatric: He has a normal mood and affect. His behavior is normal. Thought content normal.  Nursing note and vitals reviewed.   ED Treatments / Results  Labs (all labs ordered are listed, but only abnormal results are displayed) Labs Reviewed - No data to display  EKG  EKG Interpretation None      Radiology Dg Knee Complete 4 Views Left  Result Date: 09/23/2017 CLINICAL DATA:  Bilateral knee pain without reported injury. EXAM: LEFT KNEE - COMPLETE 4+ VIEW COMPARISON:  Radiographs of November 19, 2014. FINDINGS: No evidence of fracture, dislocation, or joint effusion. Severe narrowing of medial joint space is noted. Osteophyte formation is noted laterally.  Soft tissues are unremarkable. IMPRESSION: Severe degenerative joint disease medially. No acute abnormality seen in the left knee. Electronically Signed   By: Marijo Conception, M.D.   On: 09/23/2017 16:19   Dg Knee Complete 4 Views Right  Result Date: 09/23/2017 CLINICAL DATA:  Right knee pain without reported injury. EXAM: RIGHT KNEE - COMPLETE 4+ VIEW COMPARISON:  Radiographs of April 14, 2017. FINDINGS: No evidence of fracture, dislocation, or joint effusion. Moderate narrowing of medial joint space is noted. Osteophyte formation is noted laterally. Soft tissues are unremarkable. IMPRESSION: Moderate degenerative joint disease is noted medially. No acute abnormality seen in the right knee. Electronically Signed   By: Marijo Conception, M.D.   On: 09/23/2017 16:20   Dg Hand Complete Left  Result Date: 09/23/2017 CLINICAL DATA:  Patient with bilateral and pain. EXAM: LEFT HAND - COMPLETE 3+ VIEW COMPARISON:  None. FINDINGS: There is no  evidence of fracture or dislocation. There is no evidence of arthropathy or other focal bone abnormality. Soft tissues are unremarkable. IMPRESSION: No acute osseous abnormality. Electronically Signed   By: Lovey Newcomer M.D.   On: 09/23/2017 16:17   Dg Hand Complete Right  Result Date: 09/23/2017 CLINICAL DATA:  Right hand pain without reported injury. EXAM: RIGHT HAND - COMPLETE 3+ VIEW COMPARISON:  None. FINDINGS: There is no evidence of fracture or dislocation. There is no evidence of arthropathy or other focal bone abnormality. Soft tissues are unremarkable. IMPRESSION: Normal right hand. Electronically Signed   By: Marijo Conception, M.D.   On: 09/23/2017 16:17    Procedures Procedures (including critical care time)  Medications Ordered in ED Medications - No data to display   Initial Impression / Assessment and Plan / ED Course  I have reviewed the triage vital signs and the nursing notes.  Pertinent labs & imaging results that were available during my care  of the patient were reviewed by me and considered in my medical decision making (see chart for details).   Patient presents with intermittent pain to bilateral knees, bilateral hands, and the left lower back.  He is nontoxic-appearing, in no apparent distress, vitals are without significant abnormality.  Physical exam is completely benign, NVI distally.  In regards to joint pain, all joints are without swelling, erythema, or warmth, patient is afebrile, no indication of septic joint, gout, or other infectious etiology. In regards to back pain-  No neuro deficits, no point vertebral tenderness, patient is able to ambulate. No loss of bowel or bladder control. No saddle anesthesia.  No concern for cauda equina.  No fever, night sweats, weight loss, h/o cancer, or IVDU.  X-rays of hands negative, x-rays of knees consistent with arthritis. Will treat with Naproxen (last creatinine WNL) and Robaxin, discussed with patient that they are not to drive or operate heavy machinery while taking Robaxin. I discussed treatment plan, need for orthopedics follow-up, and return precautions with the patient and his wife. Provided opportunity for questions, patient and his wife confirmed understanding and are in agreement with plan.    Final Clinical Impressions(s) / ED Diagnoses   Final diagnoses:  Polyarthralgia    ED Discharge Orders        Ordered    naproxen (NAPROSYN) 500 MG tablet  2 times daily PRN     09/23/17 1623    methocarbamol (ROBAXIN) 500 MG tablet  Every 8 hours PRN     09/23/17 1623       Deyjah Kindel, [place] R, PA-C 09/23/17 1638    Malvin Johns, MD 09/23/17 1656

## 2017-09-23 NOTE — Discharge Instructions (Signed)
You were seen in the emergency department today for pain in multiple locations. The x-rays of your hands were normal, the x-rays of your knees showed findings consistent with arthritis. We have given you two medications to treat your symptoms:   -Naproxen is a nonsteroidal anti-inflammatory medication that will help with pain and swelling. Be sure to take this medication as prescribed with food, 1 pill every 12 hours,  It should be taken with food, as it can cause stomach upset, and more seriously, stomach bleeding. Do not take other nonsteroidal anti-inflammatory medications with this such as Advil, Motrin, Mobic or Aleve.   - Robaxin is the muscle relaxer I have prescribed, this is meant to help with muscle tightness. Be aware that this medication may make you drowsy therefore the first time you take this it should be at a time you are in an environment where you can rest. Do not drive or operate heavy machinery when taking this medication.   Follow up with [place], your previous orthopedic doctor, in 1 week if your symptoms have not improved. Return to the emergency department for new or worsening symptoms including but not limited to fever, redness or warmth of the skin where you are having pain, loss of control of your bowel or bladder function, numbness, weakness, or any other concerns you may have.

## 2017-10-11 ENCOUNTER — Other Ambulatory Visit: Payer: Self-pay

## 2017-10-11 ENCOUNTER — Encounter (HOSPITAL_COMMUNITY): Payer: Self-pay | Admitting: *Deleted

## 2017-10-11 ENCOUNTER — Emergency Department (HOSPITAL_COMMUNITY)
Admission: EM | Admit: 2017-10-11 | Discharge: 2017-10-11 | Disposition: A | Discharge status: Home / Self Care | Payer: Medicaid Other | Attending: Emergency Medicine | Admitting: Emergency Medicine

## 2017-10-11 DIAGNOSIS — Z79899 Other long term (current) drug therapy: Secondary | ICD-10-CM | POA: Insufficient documentation

## 2017-10-11 DIAGNOSIS — B028 Zoster with other complications: Secondary | ICD-10-CM | POA: Diagnosis not present

## 2017-10-11 DIAGNOSIS — R21 Rash and other nonspecific skin eruption: Secondary | ICD-10-CM | POA: Diagnosis present

## 2017-10-11 DIAGNOSIS — Z7984 Long term (current) use of oral hypoglycemic drugs: Secondary | ICD-10-CM | POA: Insufficient documentation

## 2017-10-11 DIAGNOSIS — E119 Type 2 diabetes mellitus without complications: Secondary | ICD-10-CM | POA: Diagnosis not present

## 2017-10-11 MED ORDER — FLUORESCEIN SODIUM 1 MG OP STRP
1.0000 | ORAL_STRIP | Freq: Once | OPHTHALMIC | Status: AC
  Administered 2017-10-11: 1 via OPHTHALMIC
  Filled 2017-10-11: qty 1

## 2017-10-11 MED ORDER — VALACYCLOVIR HCL 1 G PO TABS: 1000.0000 mg | ORAL_TABLET | Freq: Three times a day (TID) | ORAL | 0 refills | Status: AC

## 2017-10-11 MED ORDER — TETRACAINE HCL 0.5 % OP SOLN
2.0000 [drp] | Freq: Once | OPHTHALMIC | Status: AC
  Administered 2017-10-11: 2 [drp] via OPHTHALMIC
  Filled 2017-10-11: qty 4

## 2017-10-11 MED ORDER — VALACYCLOVIR HCL 500 MG PO TABS
1000.0000 mg | ORAL_TABLET | Freq: Once | ORAL | Status: AC
  Administered 2017-10-11: 1000 mg via ORAL
  Filled 2017-10-11 (×2): qty 2

## 2017-10-11 NOTE — ED Notes (Signed)
EDP at bedside  

## 2017-10-11 NOTE — Discharge Instructions (Signed)
Please follow with your primary care doctor in the next 2 days for a check-up. They must obtain records for further management.  ° °Do not hesitate to return to the Emergency Department for any new, worsening or concerning symptoms.  ° °

## 2017-10-11 NOTE — ED Notes (Signed)
Patient verbalized understanding of discharge instructions and denies any further needs or questions at this time. VS stable. Patient ambulatory with steady gait.  

## 2017-10-11 NOTE — ED Provider Notes (Signed)
[facility] EMERGENCY DEPARTMENT Provider Note   CSN: [id number] Arrival date & time: 10/11/17  2047     History   Chief Complaint Chief Complaint  Patient presents with  . Rash     HPI   Blood pressure 133/71, pulse 73, temperature 98.6 F (37 C), temperature source Oral, resp. rate 20, height '5\' 10"'$  (1.778 m), weight 95.7 kg (211 lb), SpO2 98 %.  Samuel [name] is a 62 y.o. male with past medical history significant for diabetes (patient states that he does not feel his diabetes and therefore he does not take any medication for it) he is complaining of a painless rash on the right forehead onset 5 days ago.  He denies fevers, chills, sick contacts.  He did have chickenpox as a child.  He is reporting some blurred vision onset today on review of systems.  Further discussion with this.  Patient's son he was seen by his primary care doctor yesterday, started on acyclovir, he developed blurred vision today and thought it was secondary to the acyclovir.  Past Medical History:  Diagnosis Date  . Arthritis    Knees  . Diabetes mellitus without complication ([place])   . Goiter    Multinodular Goiter with partial thyroidectomey in [place]  1998.  Ultrasound confirming diagnosis in 01/17/2014    Patient Active Problem List   Diagnosis Date Noted  . Unspecified vitamin D deficiency 03/20/2014  . Dental cavities 03/20/2014  . IFG (impaired fasting glucose) 02/26/2014  . Multinodular goiter 02/26/2014  . Goiter     Past Surgical History:  Procedure Laterality Date  . APPENDECTOMY  2014  . THYROID SURGERY  1998   Enlarged and painful thyroid       Home Medications    Prior to Admission medications   Medication Sig Start Date End Date Taking? Authorizing Provider  acetaminophen (TYLENOL) 500 MG tablet Take 1 tablet (500 mg total) by mouth every 6 (six) hours as needed. 10/05/14   Piepenbrink, Anderson Malta, PA-C  blood glucose meter kit and supplies KIT Check sugars  twice daily, in morning when fasting and in evening before dinner 02/08/16   Mack Hook, MD  glucose blood test strip Use as instructed 02/08/16   Mack Hook, MD  metFORMIN (GLUCOPHAGE-XR) 500 MG 24 hr tablet 1 tab by mouth daily with dinner 02/08/16   Mack Hook, MD  methocarbamol (ROBAXIN) 500 MG tablet Take 1 tablet (500 mg total) by mouth every 8 (eight) hours as needed for muscle spasms. 09/23/17   Petrucelli, Samantha R, PA-C  naproxen (NAPROSYN) 500 MG tablet Take 1 tablet (500 mg total) by mouth 2 (two) times daily as needed (pain). 09/23/17   Petrucelli, Samantha R, PA-C  terbinafine (LAMISIL) 250 MG tablet Take 1 tablet (250 mg total) by mouth daily. 02/08/16   Mack Hook, MD  valACYclovir (VALTREX) 1000 MG tablet Take 1 tablet (1,000 mg total) by mouth 3 (three) times daily for 10 days. 10/11/17 10/21/17  Berdina Cheever, Elmyra Ricks, PA-C    Family History Family History  Problem Relation Age of Onset  . Arthritis Sister   . Arthritis Brother   . Prostate cancer Brother     Social History Social History   Tobacco Use  . Smoking status: Never Smoker  . Smokeless tobacco: Never Used  Substance Use Topics  . Alcohol use: No    Alcohol/week: 0.0 oz  . Drug use: No     Allergies   Patient has no known allergies.  Review of Systems Review of Systems  A complete review of systems was obtained and all systems are negative except as noted in the HPI and PMH.   Physical Exam Updated Vital Signs BP 131/65 (BP Location: Right Arm)   Pulse 68   Temp 98.6 F (37 C) (Oral)   Resp 18   Ht '5\' 10"'$  (1.778 m)   Wt 95.7 kg (211 lb)   SpO2 99%   BMI 30.28 kg/m   Physical Exam  Constitutional: He is oriented to person, place, and time. He appears well-developed and well-nourished. No distress.  HENT:  Head: Normocephalic and atraumatic.  Mouth/Throat: Oropharynx is clear and moist.  Vesicular rash to right forehead going back into the hairline and he has  a lesion on the right upper eyelid.  Nothing on the nose.  Pupils equal round reactive to light, no injection, no abnormal uptake on fluorescein stain with slit lamp exam.  Eyes: Conjunctivae and EOM are normal. Pupils are equal, round, and reactive to light.  Neck: Normal range of motion.  Cardiovascular: Normal rate, regular rhythm and intact distal pulses.  Pulmonary/Chest: Effort normal and breath sounds normal.  Abdominal: Soft. There is no tenderness.  Musculoskeletal: Normal range of motion.  Neurological: He is alert and oriented to person, place, and time.  Skin: He is not diaphoretic.  Psychiatric: He has a normal mood and affect.  Nursing note and vitals reviewed.    ED Treatments / Results  Labs (all labs ordered are listed, but only abnormal results are displayed) Labs Reviewed - No data to display  EKG  EKG Interpretation None       Radiology No results found.  Procedures Procedures (including critical care time)  Medications Ordered in ED Medications  fluorescein ophthalmic strip 1 strip (1 strip Both Eyes Given by Other 10/11/17 2231)  tetracaine (PONTOCAINE) 0.5 % ophthalmic solution 2 drop (2 drops Right Eye Given by Other 10/11/17 2231)  valACYclovir (VALTREX) tablet 1,000 mg (1,000 mg Oral Given 10/11/17 2300)     Initial Impression / Assessment and Plan / ED Course  I have reviewed the triage vital signs and the nursing notes.  Pertinent labs & imaging results that were available during my care of the patient were reviewed by me and considered in my medical decision making (see chart for details).     Vitals:   10/11/17 2053 10/11/17 2140 10/11/17 2306  BP: 133/71  131/65  Pulse: 73  68  Resp: 20  18  Temp: 98.6 F (37 C)    TempSrc: Oral    SpO2: 98%  99%  Weight:  95.7 kg (211 lb)   Height:  '5\' 10"'$  (1.778 m)     Medications  fluorescein ophthalmic strip 1 strip (1 strip Both Eyes Given by Other 10/11/17 2231)  tetracaine (PONTOCAINE)  0.5 % ophthalmic solution 2 drop (2 drops Right Eye Given by Other 10/11/17 2231)  valACYclovir (VALTREX) tablet 1,000 mg (1,000 mg Oral Given 10/11/17 2300)    Samuel [name] is 62 y.o. male presenting with painless vesicular crusting rash to right forehead in the V1 distribution consistent with a zoster.  Negative Hutchinson sign.  Slit lamp exam does not reveal any dendritic lesions.  Patient will be started on acyclovir, discussed with attending physician who recommends outpatient ophthalmologic evaluation.  Extensive discussion of return precautions and patient verbalized understanding and teach back technique.  Advised them to DC the acyclovir, start the Valtrex.  Follow closely with ophthalmology.  Return  to ED for any worsening symptoms.  Evaluation does not show pathology that would require ongoing emergent intervention or inpatient treatment. Pt is hemodynamically stable and mentating appropriately. Discussed findings and plan with patient/guardian, who agrees with care plan. All questions answered. Return precautions discussed and outpatient follow up given.      Final Clinical Impressions(s) / ED Diagnoses   Final diagnoses:  Herpes zoster with complication    ED Discharge Orders        Ordered    valACYclovir (VALTREX) 1000 MG tablet  3 times daily     10/11/17 2301       Kahmya Pinkham, Charna Elizabeth 10/12/17 0024    Deno Etienne, DO 10/13/17 1503

## 2017-10-11 NOTE — ED Triage Notes (Signed)
The pt has a rash to his rt forehead and rt eyelid since last Friday the rash itches  The rash appears like a shingler rash but the pt reports the rash is not painful

## 2017-10-30 ENCOUNTER — Other Ambulatory Visit: Payer: Self-pay

## 2017-10-30 ENCOUNTER — Emergency Department (HOSPITAL_COMMUNITY)
Admission: EM | Admit: 2017-10-30 | Discharge: 2017-10-31 | Disposition: A | Discharge status: Home / Self Care | Payer: Medicaid Other | Attending: Emergency Medicine | Admitting: Emergency Medicine

## 2017-10-30 ENCOUNTER — Encounter (HOSPITAL_COMMUNITY): Payer: Self-pay | Admitting: Emergency Medicine

## 2017-10-30 ENCOUNTER — Emergency Department (HOSPITAL_COMMUNITY): Payer: Medicaid Other

## 2017-10-30 DIAGNOSIS — E119 Type 2 diabetes mellitus without complications: Secondary | ICD-10-CM | POA: Insufficient documentation

## 2017-10-30 DIAGNOSIS — R079 Chest pain, unspecified: Secondary | ICD-10-CM | POA: Diagnosis not present

## 2017-10-30 DIAGNOSIS — R002 Palpitations: Secondary | ICD-10-CM | POA: Diagnosis not present

## 2017-10-30 DIAGNOSIS — R0602 Shortness of breath: Secondary | ICD-10-CM | POA: Diagnosis not present

## 2017-10-30 LAB — BASIC METABOLIC PANEL
ANION GAP: 7 (ref 5–15)
BUN: 22 mg/dL — ABNORMAL HIGH (ref 6–20)
CHLORIDE: 106 mmol/L (ref 101–111)
CO2: 24 mmol/L (ref 22–32)
Calcium: 9.1 mg/dL (ref 8.9–10.3)
Creatinine, Ser: 0.85 mg/dL (ref 0.61–1.24)
GFR calc Af Amer: >60 mL/min (ref >60)
GLUCOSE: 125 mg/dL — AB (ref 65–99)
POTASSIUM: 4 mmol/L (ref 3.5–5.1)
Sodium: 137 mmol/L (ref 135–145)

## 2017-10-30 LAB — CBC
HEMATOCRIT: 42.4 % (ref 39.0–52.0)
HEMOGLOBIN: 13.5 g/dL (ref 13.0–17.0)
MCH: 27.2 pg (ref 26.0–34.0)
MCHC: 31.8 g/dL (ref 30.0–36.0)
MCV: 85.3 fL (ref 78.0–100.0)
Platelets: 264 10*3/uL (ref 150–400)
RBC: 4.97 MIL/uL (ref 4.22–5.81)
RDW: 14.3 % (ref 11.5–15.5)
WBC: 5.6 10*3/uL (ref 4.0–10.5)

## 2017-10-30 LAB — I-STAT TROPONIN, ED: Troponin i, poc: 0 ng/mL (ref 0.00–0.08)

## 2017-10-30 NOTE — ED Triage Notes (Signed)
Pt c/o centralized chest pain that radiates to his back and bilateral shoulders that started yesterday. Denies shortness of breath/nausea/vomiting. Denies pain at this time.

## 2017-10-30 NOTE — ED Notes (Addendum)
Pt states he will come back tomorrow, denies wanting to wait any longer after encouragement to stay.

## 2017-10-31 ENCOUNTER — Encounter (HOSPITAL_COMMUNITY): Payer: Self-pay

## 2017-10-31 ENCOUNTER — Emergency Department (HOSPITAL_COMMUNITY)
Admission: EM | Admit: 2017-10-31 | Discharge: 2017-11-01 | Disposition: A | Discharge status: Home / Self Care | Payer: Medicaid Other | Source: Home / Self Care | Attending: Emergency Medicine | Admitting: Emergency Medicine

## 2017-10-31 DIAGNOSIS — R0789 Other chest pain: Secondary | ICD-10-CM

## 2017-10-31 LAB — BASIC METABOLIC PANEL
Anion gap: 11 (ref 5–15)
BUN: 23 mg/dL — ABNORMAL HIGH (ref 6–20)
CALCIUM: 9.2 mg/dL (ref 8.9–10.3)
CO2: 23 mmol/L (ref 22–32)
CREATININE: 0.94 mg/dL (ref 0.61–1.24)
Chloride: 106 mmol/L (ref 101–111)
Glucose, Bld: 109 mg/dL — ABNORMAL HIGH (ref 65–99)
Potassium: 3.7 mmol/L (ref 3.5–5.1)
Sodium: 140 mmol/L (ref 135–145)

## 2017-10-31 LAB — CBC
HCT: 43.7 % (ref 39.0–52.0)
Hemoglobin: 14 g/dL (ref 13.0–17.0)
MCH: 27.5 pg (ref 26.0–34.0)
MCHC: 32 g/dL (ref 30.0–36.0)
MCV: 85.7 fL (ref 78.0–100.0)
PLATELETS: 256 10*3/uL (ref 150–400)
RBC: 5.1 MIL/uL (ref 4.22–5.81)
RDW: 14.2 % (ref 11.5–15.5)
WBC: 6.7 10*3/uL (ref 4.0–10.5)

## 2017-10-31 LAB — I-STAT TROPONIN, ED: TROPONIN I, POC: 0 ng/mL (ref 0.00–0.08)

## 2017-10-31 NOTE — ED Notes (Signed)
Called pt 3 times No Answer

## 2017-10-31 NOTE — ED Triage Notes (Signed)
Pt presents with 3-4 day h/o chest tightness.  Pt reports feeling palpitations and reports shortness of breath.  Pt was here last night for same and left due to family emergency.

## 2017-11-01 NOTE — ED Provider Notes (Signed)
[facility] EMERGENCY DEPARTMENT Provider Note   CSN: [id number] Arrival date & time: 10/31/17  1634     History   Chief Complaint No chief complaint on file.   HPI Samuel [name] is a 62 y.o. male.  The history is provided by the patient and medical records.     62 year old male with history of arthritis, diabetes, goiter status post partial thyroidectomy not currently on medications, presenting to the ED with chest tightness.  Reports this is been ongoing for about 3 days.  Symptoms are constant.  States he does have some intermittent shortness of breath and palpitations, but does not noticed any alleviating or exacerbating factors.  He has no known cardiac history.  He has never had symptoms like this in the past.  He is not a smoker.  No recent travel, prolonged immobilization, trauma, or surgeries.  No history of DVT or PE.  Denies any calf pain or swelling.  No cough, fever, or upper respiratory symptoms.  Patient came to the ED yesterday for same, had labs and chest x-ray performed but did not stated for evaluation with physician or to go over his results.  Past Medical History:  Diagnosis Date  . Arthritis    Knees  . Diabetes mellitus without complication ([place])   . Goiter    Multinodular Goiter with partial thyroidectomey in [place]  1998.  Ultrasound confirming diagnosis in 01/17/2014    Patient Active Problem List   Diagnosis Date Noted  . Unspecified vitamin D deficiency 03/20/2014  . Dental cavities 03/20/2014  . IFG (impaired fasting glucose) 02/26/2014  . Multinodular goiter 02/26/2014  . Goiter     Past Surgical History:  Procedure Laterality Date  . APPENDECTOMY  2014  . THYROID SURGERY  1998   Enlarged and painful thyroid        Home Medications    Prior to Admission medications   Medication Sig Start Date End Date Taking? Authorizing Provider  gabapentin (NEURONTIN) 100 MG capsule Take 100 mg by mouth 4 (four) times daily as needed  (scalp pain.).  10/26/17  Yes [provider]  acetaminophen (TYLENOL) 500 MG tablet Take 1 tablet (500 mg total) by mouth every 6 (six) hours as needed. Patient not taking: Reported on 10/31/2017 10/05/14   Piepenbrink, Anderson Malta, PA-C  blood glucose meter kit and supplies KIT Check sugars twice daily, in morning when fasting and in evening before dinner Patient not taking: Reported on 10/31/2017 02/08/16   Mack Hook, MD  glucose blood test strip Use as instructed Patient not taking: Reported on 10/31/2017 02/08/16   Mack Hook, MD  metFORMIN (GLUCOPHAGE-XR) 500 MG 24 hr tablet 1 tab by mouth daily with dinner Patient not taking: Reported on 10/31/2017 02/08/16   Mack Hook, MD  methocarbamol (ROBAXIN) 500 MG tablet Take 1 tablet (500 mg total) by mouth every 8 (eight) hours as needed for muscle spasms. Patient not taking: Reported on 10/31/2017 09/23/17   Petrucelli, Aldona Bar R, PA-C  naproxen (NAPROSYN) 500 MG tablet Take 1 tablet (500 mg total) by mouth 2 (two) times daily as needed (pain). Patient not taking: Reported on 10/31/2017 09/23/17   Petrucelli, Glynda Jaeger, PA-C  terbinafine (LAMISIL) 250 MG tablet Take 1 tablet (250 mg total) by mouth daily. Patient not taking: Reported on 10/31/2017 02/08/16   Mack Hook, MD    Family History Family History  Problem Relation Age of Onset  . Arthritis Sister   . Arthritis Brother   . Prostate cancer  Brother     Social History Social History   Tobacco Use  . Smoking status: Never Smoker  . Smokeless tobacco: Never Used  Substance Use Topics  . Alcohol use: No    Alcohol/week: 0.0 oz  . Drug use: No     Allergies   Patient has no known allergies.   Review of Systems Review of Systems  Respiratory: Positive for chest tightness.   All other systems reviewed and are negative.    Physical Exam Updated Vital Signs BP 112/71 (BP Location: Right Arm)   Pulse 73   Temp (!) 97.3 F (36.3 C) (Oral)   Resp  16   SpO2 100%   Physical Exam  Constitutional: He is oriented to person, place, and time. He appears well-developed and well-nourished.  Sleeping when approached for exam, no acute distress  HENT:  Head: Normocephalic and atraumatic.  Mouth/Throat: Oropharynx is clear and moist.  Eyes: Pupils are equal, round, and reactive to light. Conjunctivae and EOM are normal.  Neck: Normal range of motion.  Cardiovascular: Normal rate, regular rhythm and normal heart sounds.  Pulmonary/Chest: Effort normal and breath sounds normal. No stridor. No respiratory distress. He has no wheezes.  Abdominal: Soft. Bowel sounds are normal. There is no tenderness. There is no rebound.  Musculoskeletal: Normal range of motion.  For full edema, calf asymmetry, tenderness, or palpable cords, negative Homans sign bilaterally  Neurological: He is alert and oriented to person, place, and time.  Skin: Skin is warm and dry.  Psychiatric: He has a normal mood and affect.  Nursing note and vitals reviewed.    ED Treatments / Results  Labs (all labs ordered are listed, but only abnormal results are displayed) Labs Reviewed  BASIC METABOLIC PANEL - Abnormal; Notable for the following components:      Result Value   Glucose, Bld 109 (*)    BUN 23 (*)    All other components within normal limits  CBC  I-STAT TROPONIN, ED    EKG EKG Interpretation  Date/Time:  Tuesday October 31 2017 16:53:56 EDT Ventricular Rate:  92 PR Interval:  172 QRS Duration: 84 QT Interval:  352 QTC Calculation: 435 R Axis:   31 Text Interpretation:  Normal sinus rhythm Nonspecific T wave abnormality Abnormal ECG No significant change since last tracing Confirmed by Orpah Greek [phone]) on 11/01/2017 1:47:33 AM   Radiology Dg Chest 2 View  Result Date: 10/30/2017 CLINICAL DATA:  Bilateral shoulder and chest pain with dyspnea and EXAM: CHEST - 2 VIEW COMPARISON:  03/30/2016 FINDINGS: The heart size and mediastinal  contours are within normal limits. Lungs are mildly hyperinflated. The visualized skeletal structures are unremarkable. IMPRESSION: No active cardiopulmonary disease. Electronically Signed   By: Ashley Royalty M.D.   On: 10/30/2017 23:06    Procedures Procedures (including critical care time)  Medications Ordered in ED Medications - No data to display   Initial Impression / Assessment and Plan / ED Course  I have reviewed the triage vital signs and the nursing notes.  Pertinent labs & imaging results that were available during my care of the patient were reviewed by me and considered in my medical decision making (see chart for details).  62 year old male presenting to the ED with chest tightness.  Came yesterday and had workup from triage but left prior to evaluation with physician.  He is afebrile and nontoxic.  EKG with some nonspecific T wave changes but no acute ischemia.  Labs from yesterday reviewed,  all were reassuring.  Chest x-ray was clear.  Repeat labs today remain reassuring, troponin negative.  Patient has no known cardiac history, symptoms are somewhat atypical.  He has no risk factors or clinical signs/symptoms concerning for DVT or PE.  Given negative evaluation for the past 2 consecutive days despite ongoing symptoms, doubt ACS, dissection, acute cardiac event.  Feel he can be discharged home to follow-up closely with his primary care doctor.  Discussed plan with patient, he acknowledged understanding and agreed with plan of care.  Return precautions given for new or worsening symptoms.  Final Clinical Impressions(s) / ED Diagnoses   Final diagnoses:  Chest tightness    ED Discharge Orders    None       Larene Pickett, PA-C 11/01/17 0255    Orpah Greek, MD 11/01/17 2330

## 2017-11-01 NOTE — Discharge Instructions (Signed)
Your work-up for the past 2 days has been normal. Please follow-up with your primary care doctor. Return to the ED for new or worsening symptoms.

## 2017-11-01 NOTE — ED Notes (Signed)
ED Provider at bedside. 

## 2017-11-01 NOTE — ED Notes (Signed)
Patient verbalizes understanding of discharge instructions. Opportunity for questioning and answers were provided. Armband removed by staff, pt discharged from ED. E signature not available at this time.  

## 2018-03-24 ENCOUNTER — Emergency Department (HOSPITAL_COMMUNITY): Payer: Medicaid Other

## 2018-03-24 ENCOUNTER — Other Ambulatory Visit: Payer: Self-pay

## 2018-03-24 ENCOUNTER — Emergency Department (HOSPITAL_COMMUNITY)
Admission: EM | Admit: 2018-03-24 | Discharge: 2018-03-24 | Disposition: A | Discharge status: Home / Self Care | Payer: Medicaid Other | Attending: Emergency Medicine | Admitting: Emergency Medicine

## 2018-03-24 ENCOUNTER — Encounter (HOSPITAL_COMMUNITY): Payer: Self-pay | Admitting: Emergency Medicine

## 2018-03-24 DIAGNOSIS — M25561 Pain in right knee: Secondary | ICD-10-CM

## 2018-03-24 DIAGNOSIS — Z79899 Other long term (current) drug therapy: Secondary | ICD-10-CM | POA: Diagnosis not present

## 2018-03-24 DIAGNOSIS — R079 Chest pain, unspecified: Secondary | ICD-10-CM | POA: Diagnosis present

## 2018-03-24 DIAGNOSIS — E119 Type 2 diabetes mellitus without complications: Secondary | ICD-10-CM | POA: Diagnosis not present

## 2018-03-24 DIAGNOSIS — R0789 Other chest pain: Secondary | ICD-10-CM

## 2018-03-24 LAB — BASIC METABOLIC PANEL WITH GFR
Anion gap: 7 (ref 5–15)
BUN: 18 mg/dL (ref 6–20)
CO2: 26 mmol/L (ref 22–32)
Calcium: 9 mg/dL (ref 8.9–10.3)
Chloride: 105 mmol/L (ref 98–111)
Creatinine, Ser: 0.88 mg/dL (ref 0.61–1.24)
GFR calc Af Amer: >60 mL/min (ref >60)
GFR calc non Af Amer: >60 mL/min (ref >60)
Glucose, Bld: 152 mg/dL — ABNORMAL HIGH (ref 70–99)
Potassium: 4.2 mmol/L (ref 3.5–5.1)
Sodium: 138 mmol/L (ref 135–145)

## 2018-03-24 LAB — CBC
HCT: 45.4 % (ref 39.0–52.0)
Hemoglobin: 14.1 g/dL (ref 13.0–17.0)
MCH: 27 pg (ref 26.0–34.0)
MCHC: 31.1 g/dL (ref 30.0–36.0)
MCV: 87 fL (ref 78.0–100.0)
Platelets: 254 K/uL (ref 150–400)
RBC: 5.22 MIL/uL (ref 4.22–5.81)
RDW: 13.6 % (ref 11.5–15.5)
WBC: 5.4 K/uL (ref 4.0–10.5)

## 2018-03-24 LAB — I-STAT TROPONIN, ED: Troponin i, poc: 0 ng/mL (ref 0.00–0.08)

## 2018-03-24 MED ORDER — HYDROCODONE-ACETAMINOPHEN 5-325 MG PO TABS: 1.0000 | ORAL_TABLET | Freq: Four times a day (QID) | ORAL | 0 refills | Status: DC | PRN

## 2018-03-24 NOTE — Discharge Instructions (Signed)
Follow-up with your family doctor in the next week.  Also follow-up with your orthopedic doctor for your knee

## 2018-03-24 NOTE — ED Triage Notes (Signed)
Pt states he has had intermittent chest pain for 3 days, states the pain is to mid chest, and is worse with breathing. Denies cough or fevers. Denies any other symptoms.

## 2018-03-25 NOTE — ED Provider Notes (Signed)
[name] EMERGENCY DEPARTMENT Provider Note   CSN: [id number] Arrival date & time: 03/24/18  1058     History   Chief Complaint Chief Complaint  Patient presents with  . Chest Pain    HPI Samuel [name] is a 62 y.o. male.  Patient complains of chest pain and left knee pain.  When I saw the patient he was no longer having chest discomfort he was more concerned about his left knee pain  The history is provided by the patient. No language interpreter was used.  Chest Pain   This is a new problem. The current episode started less than 1 hour ago. The problem occurs rarely. The problem has been resolved. Associated with: Unknown. The pain is present in the substernal region. The pain is at a severity of 2/10. The pain is mild. The quality of the pain is described as brief. The pain does not radiate. Pertinent negatives include no abdominal pain, no back pain, no cough and no headaches.  Pertinent negatives for past medical history include no seizures.    Past Medical History:  Diagnosis Date  . Arthritis    Knees  . Diabetes mellitus without complication ([place])   . Goiter    Multinodular Goiter with partial thyroidectomey in [place]  1998.  Ultrasound confirming diagnosis in 01/17/2014    Patient Active Problem List   Diagnosis Date Noted  . Unspecified vitamin D deficiency 03/20/2014  . Dental cavities 03/20/2014  . IFG (impaired fasting glucose) 02/26/2014  . Multinodular goiter 02/26/2014  . Goiter     Past Surgical History:  Procedure Laterality Date  . APPENDECTOMY  2014  . THYROID SURGERY  1998   Enlarged and painful thyroid        Home Medications    Prior to Admission medications   Medication Sig Start Date End Date Taking? Authorizing Provider  celecoxib (CELEBREX) 200 MG capsule Take 200 mg by mouth 2 (two) times daily.  01/14/18  Yes [provider]  acetaminophen (TYLENOL) 500 MG tablet Take 1 tablet (500 mg total) by mouth  every 6 (six) hours as needed. Patient not taking: Reported on 10/31/2017 10/05/14   Piepenbrink, Anderson Malta, PA-C  blood glucose meter kit and supplies KIT Check sugars twice daily, in morning when fasting and in evening before dinner Patient not taking: Reported on 10/31/2017 02/08/16   Mack Hook, MD  glucose blood test strip Use as instructed Patient not taking: Reported on 10/31/2017 02/08/16   Mack Hook, MD  HYDROcodone-acetaminophen (NORCO/VICODIN) 5-325 MG tablet Take 1 tablet by mouth every 6 (six) hours as needed for moderate pain. 03/24/18   Milton Ferguson, MD  metFORMIN (GLUCOPHAGE-XR) 500 MG 24 hr tablet 1 tab by mouth daily with dinner Patient not taking: Reported on 10/31/2017 02/08/16   Mack Hook, MD  methocarbamol (ROBAXIN) 500 MG tablet Take 1 tablet (500 mg total) by mouth every 8 (eight) hours as needed for muscle spasms. Patient not taking: Reported on 10/31/2017 09/23/17   Petrucelli, Aldona Bar R, PA-C  naproxen (NAPROSYN) 500 MG tablet Take 1 tablet (500 mg total) by mouth 2 (two) times daily as needed (pain). Patient not taking: Reported on 10/31/2017 09/23/17   Petrucelli, Glynda Jaeger, PA-C  terbinafine (LAMISIL) 250 MG tablet Take 1 tablet (250 mg total) by mouth daily. Patient not taking: Reported on 10/31/2017 02/08/16   Mack Hook, MD    Family History Family History  Problem Relation Age of Onset  . Arthritis Sister   .  Arthritis Brother   . Prostate cancer Brother     Social History Social History   Tobacco Use  . Smoking status: Never Smoker  . Smokeless tobacco: Never Used  Substance Use Topics  . Alcohol use: No    Alcohol/week: 0.0 standard drinks  . Drug use: No     Allergies   Patient has no known allergies.   Review of Systems Review of Systems  Constitutional: Negative for appetite change and fatigue.  HENT: Negative for congestion, ear discharge and sinus pressure.   Eyes: Negative for discharge.  Respiratory: Negative  for cough.   Cardiovascular: Positive for chest pain.  Gastrointestinal: Negative for abdominal pain and diarrhea.  Genitourinary: Negative for frequency and hematuria.  Musculoskeletal: Negative for back pain.       Left knee pain  Skin: Negative for rash.  Neurological: Negative for seizures and headaches.  Psychiatric/Behavioral: Negative for hallucinations.     Physical Exam Updated Vital Signs BP 116/79   Pulse 61   Temp 98.5 F (36.9 C) (Oral)   Resp 13   SpO2 98%   Physical Exam  Constitutional: He is oriented to person, place, and time. He appears well-developed.  HENT:  Head: Normocephalic.  Eyes: Conjunctivae and EOM are normal. No scleral icterus.  Neck: Neck supple. No thyromegaly present.  Cardiovascular: Normal rate and regular rhythm. Exam reveals no gallop and no friction rub.  No murmur heard. Pulmonary/Chest: No stridor. He has no wheezes. He has no rales. He exhibits no tenderness.  Abdominal: He exhibits no distension. There is no tenderness. There is no rebound.  Musculoskeletal: Normal range of motion. He exhibits no edema.  Tenderness to left knee with full range of motion  Lymphadenopathy:    He has no cervical adenopathy.  Neurological: He is oriented to person, place, and time. He exhibits normal muscle tone. Coordination normal.  Skin: No rash noted. No erythema.  Psychiatric: He has a normal mood and affect. His behavior is normal.     ED Treatments / Results  Labs (all labs ordered are listed, but only abnormal results are displayed) Labs Reviewed  BASIC METABOLIC PANEL - Abnormal; Notable for the following components:      Result Value   Glucose, Bld 152 (*)    All other components within normal limits  CBC  I-STAT TROPONIN, ED    EKG None  Radiology Dg Chest 2 View  Result Date: 03/24/2018 CLINICAL DATA:  Pt states he has had intermittent chest pain for 3 days, states the pain is to mid chest, and is worse with breathing.  Denies cough or fevers. Denies any other symptoms. EXAM: CHEST - 2 VIEW COMPARISON:  10/30/2017 FINDINGS: Cardiac silhouette is normal in size and configuration. No mediastinal or hilar masses. There is no evidence of adenopathy. Lungs are hyperexpanded but clear. No pleural effusion or pneumothorax. Skeletal structures are intact. IMPRESSION: No acute cardiopulmonary disease. Electronically Signed   By: Lajean Manes M.D.   On: 03/24/2018 12:02   Dg Knee Complete 4 Views Left  Result Date: 03/24/2018 CLINICAL DATA:  Chronic LEFT knee pain with limited range of motion. No known injuries. EXAM: LEFT KNEE - COMPLETE 4+ VIEW COMPARISON:  09/23/2017, 11/19/2014. FINDINGS: No evidence of acute, subacute or healed fractures. Severe MEDIAL compartment joint space narrowing, moderate LATERAL compartment joint space narrowing and severe patellofemoral compartment joint space narrowing, all associated with hypertrophic spurring. Well-preserved bone mineral density. Small joint effusion. IMPRESSION: 1. Tricompartment osteoarthritis, worst in the  MEDIAL compartment. 2. Small joint effusion. Electronically Signed   By: Evangeline Dakin M.D.   On: 03/24/2018 14:40    Procedures Procedures (including critical care time)  Medications Ordered in ED Medications - No data to display   Initial Impression / Assessment and Plan / ED Course  I have reviewed the triage vital signs and the nursing notes.  Pertinent labs & imaging results that were available during my care of the patient were reviewed by me and considered in my medical decision making (see chart for details).     With severe arthritis of knee.  He will be given some pain medicine Ortho referral.  Chest pain atypical and he will follow-up with her primary care doctor.  Labs unremarkable  Final Clinical Impressions(s) / ED Diagnoses   Final diagnoses:  Atypical chest pain  Acute pain of right knee    ED Discharge Orders         Ordered     HYDROcodone-acetaminophen (NORCO/VICODIN) 5-325 MG tablet  Every 6 hours PRN     03/24/18 1633           Milton Ferguson, MD 03/25/18 1513

## 2018-04-05 ENCOUNTER — Encounter: Payer: Self-pay | Admitting: Physician Assistant

## 2018-04-05 ENCOUNTER — Other Ambulatory Visit: Payer: Self-pay

## 2018-04-05 ENCOUNTER — Ambulatory Visit (INDEPENDENT_AMBULATORY_CARE_PROVIDER_SITE_OTHER): Payer: Medicaid Other | Admitting: Physician Assistant

## 2018-04-05 VITALS — BP 114/70 | HR 75 | Temp 99.2°F | Resp 16 | Ht 72.0 in | Wt 214.4 lb

## 2018-04-05 DIAGNOSIS — M170 Bilateral primary osteoarthritis of knee: Secondary | ICD-10-CM | POA: Diagnosis not present

## 2018-04-05 MED ORDER — CELECOXIB 200 MG PO CAPS: 200.0000 mg | ORAL_CAPSULE | Freq: Two times a day (BID) | ORAL | 0 refills | Status: DC

## 2018-04-05 NOTE — Patient Instructions (Addendum)
Please use medication as prescribed for pain.  You can also use over the counter extra strength tylenol. I have given you a brace to use on the left leg especially while at work. I recommend following up with your orthopedic doctor due to the severity of arthritis in your knees.    Osteoarthritis Osteoarthritis is a type of arthritis that affects tissue that covers the ends of bones in joints (cartilage). Cartilage acts as a cushion between the bones and helps them move smoothly. Osteoarthritis results when cartilage in the joints gets worn down. Osteoarthritis is sometimes called "wear and tear" arthritis. Osteoarthritis is the most common form of arthritis. It often occurs in older people. It is a condition that gets worse over time (a progressive condition). Joints that are most often affected by this condition are in:  Fingers.  Toes.  Hips.  Knees.  Spine, including neck and lower back.  What are the causes? This condition is caused by age-related wearing down of cartilage that covers the ends of bones. What increases the risk? The following factors may make you more likely to develop this condition:  Older age.  Being overweight or obese.  Overuse of joints, such as in athletes.  Past injury of a joint.  Past surgery on a joint.  Family history of osteoarthritis.  What are the signs or symptoms? The main symptoms of this condition are pain, swelling, and stiffness in the joint. The joint may lose its shape over time. Small pieces of bone or cartilage may break off and float inside of the joint, which may cause more pain and damage to the joint. Small deposits of bone (osteophytes) may grow on the edges of the joint. Other symptoms may include:  A grating or scraping feeling inside the joint when you move it.  Popping or creaking sounds when you move.  Symptoms may affect one or more joints. Osteoarthritis in a major joint, such as your knee or hip, can make it painful  to walk or exercise. If you have osteoarthritis in your hands, you might not be able to grip items, twist your hand, or control small movements of your hands and fingers (fine motor skills). How is this diagnosed? This condition may be diagnosed based on:  Your medical history.  A physical exam.  Your symptoms.  X-rays of the affected joint(s).  Blood tests to rule out other types of arthritis.  How is this treated? There is no cure for this condition, but treatment can help to control pain and improve joint function. Treatment plans may include:  A prescribed exercise program that allows for rest and joint relief. You may work with a physical therapist.  A weight control plan.  Pain relief techniques, such as: ? Applying heat and cold to the joint. ? Electric pulses delivered to nerve endings under the skin (transcutaneous electrical nerve stimulation, or TENS). ? Massage. ? Certain nutritional supplements.  NSAIDs or prescription medicines to help relieve pain.  Medicine to help relieve pain and inflammation (corticosteroids). This can be given by mouth (orally) or as an injection.  Assistive devices, such as a brace, wrap, splint, specialized glove, or cane.  Surgery, such as: ? An osteotomy. This is done to reposition the bones and relieve pain or to remove loose pieces of bone and cartilage. ? Joint replacement surgery. You may need this surgery if you have very bad (advanced) osteoarthritis.  Follow these instructions at home: Activity  Rest your affected joints as directed by  your health care provider.  Do not drive or use heavy machinery while taking prescription pain medicine.  Exercise as directed. Your health care provider or physical therapist may recommend specific types of exercise, such as: ? Strengthening exercises. These are done to strengthen the muscles that support joints that are affected by arthritis. They can be performed with weights or with  exercise bands to add resistance. ? Aerobic activities. These are exercises, such as brisk walking or water aerobics, that get your heart pumping. ? Range-of-motion activities. These keep your joints easy to move. ? Balance and agility exercises. Managing pain, stiffness, and swelling  If directed, apply heat to the affected area as often as told by your health care provider. Use the heat source that your health care provider recommends, such as a moist heat pack or a heating pad. ? If you have a removable assistive device, remove it as told by your health care provider. ? Place a towel between your skin and the heat source. If your health care provider tells you to keep the assistive device on while you apply heat, place a towel between the assistive device and the heat source. ? Leave the heat on for 20-30 minutes. ? Remove the heat if your skin turns bright red. This is especially important if you are unable to feel pain, heat, or cold. You may have a greater risk of getting burned.  If directed, put ice on the affected joint: ? If you have a removable assistive device, remove it as told by your health care provider. ? Put ice in a plastic bag. ? Place a towel between your skin and the bag. If your health care provider tells you to keep the assistive device on during icing, place a towel between the assistive device and the bag. ? Leave the ice on for 20 minutes, 2-3 times a day. General instructions  Take over-the-counter and prescription medicines only as told by your health care provider.  Maintain a healthy weight. Follow instructions from your health care provider for weight control. These may include dietary restrictions.  Do not use any products that contain nicotine or tobacco, such as cigarettes and e-cigarettes. These can delay bone healing. If you need help quitting, ask your health care provider.  Use assistive devices as directed by your health care provider.  Keep all  follow-up visits as told by your health care provider. This is important. Where to find more information:  [employer] of Arthritis and Musculoskeletal and Skin Diseases: www.niams.SouthExposed.es  [employer] on Aging: http://kim-miller.com/  American College of Rheumatology: www.rheumatology.org Contact a health care provider if:  Your skin turns red.  You develop a rash.  You have pain that gets worse.  You have a fever along with joint or muscle aches. Get help right away if:  You lose a lot of weight.  You suddenly lose your appetite.  You have night sweats. Summary  Osteoarthritis is a type of arthritis that affects tissue covering the ends of bones in joints (cartilage).  This condition is caused by age-related wearing down of cartilage that covers the ends of bones.  The main symptom of this condition is pain, swelling, and stiffness in the joint.  There is no cure for this condition, but treatment can help to control pain and improve joint function. This information is not intended to replace advice given to you by your health care provider. Make sure you discuss any questions you have with your health care provider. Document  Released: 07/11/2005 Document Revised: 03/14/2016 Document Reviewed: 03/14/2016 Elsevier Interactive Patient Education  2018 [employer].   IF you received an x-ray today, you will receive an invoice from [facility]. Please contact [facility] at [phone] with questions or concerns regarding your invoice.   IF you received labwork today, you will receive an invoice from [facility]. Please contact [facility] at [phone] with questions or concerns regarding your invoice.   Our billing staff will not be able to assist you with questions regarding bills from these companies.  You will be contacted with the lab results as soon as they are available. The fastest way to get your results is to activate your My Chart account.  Instructions are located on the last page of this paperwork. If you have not heard from [place] regarding the results in 2 weeks, please contact this office.

## 2018-04-05 NOTE — Addendum Note (Signed)
Addended by: Tenna Delaine D on: 04/05/2018 11:07 AM   Modules accepted: Level of Service

## 2018-04-05 NOTE — Progress Notes (Signed)
Samuel [name]  MRN: [id number] DOB: [birth year]  Subjective:  Samuel [name] is a 62 y.o. male who presents with knee pain involving both knees, L>R. Onset was gradual, has been present for about 6 years but worsened about 6 months ago. Inciting event: none known, but stands on his feet on hard floor all day long. Current symptoms include: swelling and pain with bending knee all the way back.  Denies redness, warmth, and fever.  Pain is aggravated by squatting and bending knee. Patient has had prior knee problems. Was seen in ED for this in 11/2017. Evaluation to date: plain films.  Plain films of left knee on 02/22/18 showed tricompartmental OA worse in the medial compartment.  Plain film of the right knee on 09/23/2017 showed moderate degenerative joint disease medially.  Treatment to day: Has been evaluated by the ED multiple times for this and treated with multiple different types of anti-inflammatories and pain meds.  Notes Celebrex has worked for him but he does not have anymore.  He has also seen orthopedics for this and has had injections.  He does not want to keep doing injections.  He has not tried bracing.  No PMH of gout.   Review of Systems Per HPI  Patient Active Problem List   Diagnosis Date Noted  . Unspecified vitamin D deficiency 03/20/2014  . Dental cavities 03/20/2014  . IFG (impaired fasting glucose) 02/26/2014  . Multinodular goiter 02/26/2014  . Goiter     Current Outpatient Medications on File Prior to Visit  Medication Sig Dispense Refill  . acetaminophen (TYLENOL) 500 MG tablet Take 1 tablet (500 mg total) by mouth every 6 (six) hours as needed. 30 tablet 0  . blood glucose meter kit and supplies KIT Check sugars twice daily, in morning when fasting and in evening before dinner (Patient not taking: Reported on 10/31/2017) 1 each 0  . celecoxib (CELEBREX) 200 MG capsule Take 200 mg by mouth 2 (two) times daily.     Marland Kitchen glucose blood test strip Use as instructed (Patient not  taking: Reported on 10/31/2017) 100 each 12  . HYDROcodone-acetaminophen (NORCO/VICODIN) 5-325 MG tablet Take 1 tablet by mouth every 6 (six) hours as needed for moderate pain. (Patient not taking: Reported on 04/05/2018) 20 tablet 0  . metFORMIN (GLUCOPHAGE-XR) 500 MG 24 hr tablet 1 tab by mouth daily with dinner (Patient not taking: Reported on 10/31/2017) 30 tablet 11  . methocarbamol (ROBAXIN) 500 MG tablet Take 1 tablet (500 mg total) by mouth every 8 (eight) hours as needed for muscle spasms. (Patient not taking: Reported on 10/31/2017) 30 tablet 0  . naproxen (NAPROSYN) 500 MG tablet Take 1 tablet (500 mg total) by mouth 2 (two) times daily as needed (pain). (Patient not taking: Reported on 10/31/2017) 30 tablet 0  . terbinafine (LAMISIL) 250 MG tablet Take 1 tablet (250 mg total) by mouth daily. (Patient not taking: Reported on 10/31/2017) 90 tablet 0   No current facility-administered medications on file prior to visit.     No Known Allergies   Objective:  BP 114/70 (BP Location: Left Arm, Patient Position: Sitting, Cuff Size: Large)   Pulse 75   Temp 99.2 F (37.3 C) (Oral)   Resp 16   Ht 6' (1.829 m)   Wt 214 lb 6.4 oz (97.3 kg)   SpO2 97%   BMI 29.08 kg/m   Physical Exam  Constitutional: He is oriented to person, place, and time. He appears well-developed and well-nourished. No distress.  HENT:  Head: Normocephalic and atraumatic.  Eyes: Conjunctivae are normal.  Neck: Normal range of motion.  Cardiovascular:  Pulses:      Dorsalis pedis pulses are 1+ on the right side, and 1+ on the left side.  Pulmonary/Chest: Effort normal.  Musculoskeletal:       Right knee: He exhibits normal range of motion, no swelling, no ecchymosis, no erythema, normal alignment, no LCL laxity, normal patellar mobility, no bony tenderness, normal meniscus and no MCL laxity. No tenderness found. No medial joint line, no lateral joint line, no MCL, no LCL and no patellar tendon tenderness noted.       Left  knee: He exhibits swelling (mild swellling of suprapatellar region). He exhibits normal range of motion, no ecchymosis, no erythema, normal alignment, no LCL laxity and normal patellar mobility. No tenderness found. No medial joint line, no lateral joint line, no MCL, no LCL and no patellar tendon tenderness noted.  Neurological: He is alert and oriented to person, place, and time.  Sensation of BLE intact Strength of BLE 5/5  Skin: Skin is warm and dry.  No warmth palpated on bilateral knees.  Psychiatric: He has a normal mood and affect.  Vitals reviewed.   Assessment and Plan :  1. Osteoarthritis of both knees, unspecified osteoarthritis type Patient has bilateral knee OA.  It is worse on the left.  No erythema, warmth, numbness, or tingling.  He is NVI.  Plain films from 03/24/18 show tricompartmental OA, worse in the medial compartment.  Discussed diagnosis of OA with patient.  Refill of Celebrex provided.  Also informed the patient he can take extra strength Tylenol as prescribed for pain.  Recommend knee OA brace for the left knee due to severity of medial compartment OA.  Encouraged him to wear this while he is standing on his feet for long periods of time at work.  Recommended to follow-up with orthopedics to discuss severity of OA and further management.  Patient agrees to do so. - Knee brace Meds ordered this encounter  Medications  . celecoxib (CELEBREX) 200 MG capsule    Sig: Take 1 capsule (200 mg total) by mouth 2 (two) times daily.    Dispense:  30 capsule    Refill:  0    Order Specific Question:   Supervising Provider    Answer:   Horald Pollen [[id number]]        Tenna Delaine PA-C  [facility] 04/05/2018 10:29 AM

## 2018-04-09 NOTE — Progress Notes (Deleted)
    MRN: [id number]  Subjective:   Samuel [name] is a 62 y.o. male who presents for follow up of Type 2 diabetes mellitus.   Diagnosis was made ***. Patient is currently managed with {dm interventions:14074}. Admits {good/fair/poor:33178} compliance. Denies adverse effects including metallic taste, hypoglycemia, nausea, vomiting, ***.   Patient {is/are not:32546} checking home blood sugars. Home blood sugar records: {dm home sugars:14018}. Current symptoms include {dm sx:14075}. Patient denies {dm sx:19199}. Patient {is/are not:32546} checking their feet daily. *** foot concerns. Last diabetic eye exam eye exam ***.   Diet *** {diet habits:16563}. Exercise includes {exercise types:16438}. Denies smoking or alcohol use.   Known diabetic complications: {diabetes complications:1215}  Immunizations: Flu vaccine ***, shingles ***, pneumococal vaccine ***  Other concerns: ***   Objective:   PHYSICAL EXAM There were no vitals taken for this visit.  Physical Exam  Diabetic Foot Exam - Simple   No data filed      No results found for this or any previous visit (from the past 24 hour(s)).  Assessment and Plan :  There are no diagnoses linked to this encounter.  Tenna Delaine, PA-C  [facility] 04/09/2018 6:57 PM

## 2018-04-10 ENCOUNTER — Encounter: Payer: Medicaid Other | Admitting: Physician Assistant

## 2018-04-12 ENCOUNTER — Emergency Department (HOSPITAL_COMMUNITY)
Admission: EM | Admit: 2018-04-12 | Discharge: 2018-04-12 | Disposition: A | Discharge status: Home / Self Care | Payer: Medicaid Other | Attending: Emergency Medicine | Admitting: Emergency Medicine

## 2018-04-12 ENCOUNTER — Other Ambulatory Visit: Payer: Self-pay

## 2018-04-12 ENCOUNTER — Encounter (HOSPITAL_COMMUNITY): Payer: Self-pay | Admitting: Emergency Medicine

## 2018-04-12 DIAGNOSIS — E119 Type 2 diabetes mellitus without complications: Secondary | ICD-10-CM | POA: Insufficient documentation

## 2018-04-12 DIAGNOSIS — Z760 Encounter for issue of repeat prescription: Secondary | ICD-10-CM | POA: Diagnosis not present

## 2018-04-12 DIAGNOSIS — G8929 Other chronic pain: Secondary | ICD-10-CM | POA: Diagnosis not present

## 2018-04-12 DIAGNOSIS — Z79899 Other long term (current) drug therapy: Secondary | ICD-10-CM | POA: Insufficient documentation

## 2018-04-12 NOTE — Discharge Instructions (Addendum)
Please return to the Emergency Department for any new or worsening symptoms or if your symptoms do not improve. Please be sure to follow up with your Primary Care Physician as soon as possible regarding your visit today. If you do not have a Primary Doctor please use the resources below to establish one. It is against our policy to refill narcotic prescriptions.  Please follow-up with the orthopedic doctor on your discharge paperwork for further evaluation of your chronic knee pain and arthritis.  Contact a health care provider if: Your knee pain continues, changes, or gets worse. You have a fever along with knee pain. Your knee buckles or locks up. Your knee swells, and the swelling becomes worse. Get help right away if: Your knee feels warm to the touch. You cannot move your knee. You have severe pain in your knee. You have chest pain. You have trouble breathing.  Do not take your medicine if  develop an itchy rash, swelling in your mouth or lips, or difficulty breathing.   RESOURCE GUIDE  Chronic Pain Problems: Contact [facility]  [phone] Patients need to be referred by their primary care doctor.  Insufficient Money for Medicine: Contact [employer]:  call "211" or [place] [phone].  No Primary Care Doctor: Call [facility]  [phone] - can help you locate a primary care doctor that  accepts your insurance, provides certain services, etc. Physician Referral Service- [phone]  Agencies that provide inexpensive medical care: [facility]  [facility]  [phone] [facility]  [phone] [facility]  [phone] [facility]  [phone] [facility]  [phone]  [place] Providers: [facility]- [address], [address]  [phone], Mon-Fri 9am-7pm, Sat 9am-1pm [place], Suite  [place], [address] [place]  [place]- [address]  [place], [address], [phone]  Only accepts [place] patients after they have their name  applied to their card  Self Pay (no insurance) in [facility]: Sickle Cell Patients: Dr Kevan Ny, [facility]  [place], [facility]- [place]  [facility]- [address], [facility]- see information above (Speak to [employer] if you do not have insurance)       -  [facility]- [place], [place],  [place]       -  [place], [phone]       -  Dr Vista Lawman-  [address], [address], [place], [facility]- [address] [phone]       -  [facility]- [address] [place], [place], also [address] [phone]       -    [facility]- [address], [place], 1st & 3rd Saturday   every month, 10am-1pm  1) Find a Doctor and Pay Out of Pocket Although you won't have  to find out who is covered by your insurance plan, it is a good idea to ask around and get recommendations. You will then need to call the office and see if the doctor you have chosen will accept you as a new patient and what types of options they offer for patients who are self-pay. Some doctors offer discounts or will set up payment plans for their patients who do not have insurance, but you will need to ask so you aren't surprised when you get to your appointment.  2) Contact Your Local Health Department Not all health departments have doctors that can see patients for sick visits, but many do, so it is worth a call to see if yours does. If you  don't know where your local health department is, you can check in your phone book. The CDC also has a tool to help you locate your state's health department, and many state websites also have listings of all of their local health departments.  3) Find a [facility] If your illness is not likely to be very severe or complicated, you may want to try a walk in clinic. These are popping up all over the country in pharmacies, drugstores, and shopping centers. They're usually staffed by nurse practitioners or physician assistants that have been trained to treat common illnesses and complaints. They're usually fairly quick and inexpensive. However, if you have serious medical issues or chronic medical problems, these are probably not your best option  [facility] [place], [facility], [address] [place], phone [phone] or [phone].  Monday - Friday, call for an appointment. [place], [facility], [address], [place], phone [phone] or [phone].  Monday - Friday, call for an appointment.  Abuse/Neglect: [place] [phone] [place] [phone] (After Hours)  Emergency Shelter:  [facility] [phone]  Maternity Homes: [facility] [place] [phone] [place] [phone]  MRSA Hotline #:   253-153-7244  [facility] Dept. [address]         [place] Phone:  [phone]                                  Phone:  [phone]                   Phone:  [place], [place] in [place], [address],  [phone], [place] [phone] or [phone] (After Hours)   [place]  Substance Abuse Resources: Alcohol and Drug Services  [place] [phone] [facility] Chinita Pester [phone] Residential & Outpatient Substance Abuse Program  [phone]  Psychological Services: [place]  [phone] [place]  [place], [place] [address] [place], [place]: [phone] or [phone], [employer]  Dental Assistance  If unable to pay or uninsured, contact:  [facility] or [facility]. to become qualified for the adult dental clinic.  Patients with Medicaid: [facility] [phone] [address], [place] [address], [phone]  If unable to pay, or uninsured, contact [facility] [phone]) or [employer] [phone] in [place], [place] in [facility]) to become qualified for the adult dental clinic   Other [place]- [place], [place], [place], [address], [place], [place], 2nd and 4th Thursday of the month at 6:30am.  10 clients each day by appointment, can sometimes see walk-in patients if someone does not show for an appointment. [facility]- [address] Hillard Danker [place], [place], [address], [place], [place], [place], [address], [place] Department- [phone] [place] [facility] [phone]

## 2018-04-12 NOTE — ED Triage Notes (Signed)
Pt states he was seen for knee pain a couple weeks ago and has ran out of his pain medicine and would like a refill. Denies further injury or any other complaints at this time.

## 2018-04-12 NOTE — ED Provider Notes (Signed)
[facility] EMERGENCY DEPARTMENT Provider Note   CSN: [id number] Arrival date & time: 04/12/18  1815     History   Chief Complaint Chief Complaint  Patient presents with  . Medication Refill    HPI Samuel [name] is a 62 y.o. male presenting for medication refill.  Patient was seen here on 03/24/2018 and prescribed Percocet for left knee pain. At that time imaging showed arthritis of the knee.  Patient states that he has recently run out of the Percocet and is requesting a refill.  Patient denies new injury or change in pain.  Patient states that he has had knee pain for approximately 30 years. Patient states that he did not follow-up with the orthopedic doctor as advised after his last visit.  Patient denies fever, joint swelling, color change, increased warmth or new injury.  HPI  Past Medical History:  Diagnosis Date  . Arthritis    Knees  . Diabetes mellitus without complication ([place])   . Goiter    Multinodular Goiter with partial thyroidectomey in [place]  1998.  Ultrasound confirming diagnosis in 01/17/2014    Patient Active Problem List   Diagnosis Date Noted  . Unspecified vitamin D deficiency 03/20/2014  . Dental cavities 03/20/2014  . IFG (impaired fasting glucose) 02/26/2014  . Multinodular goiter 02/26/2014  . Goiter     Past Surgical History:  Procedure Laterality Date  . APPENDECTOMY  2014  . THYROID SURGERY  1998   Enlarged and painful thyroid        Home Medications    Prior to Admission medications   Medication Sig Start Date End Date Taking? Authorizing Provider  acetaminophen (TYLENOL) 500 MG tablet Take 1 tablet (500 mg total) by mouth every 6 (six) hours as needed. 10/05/14   Piepenbrink, Anderson [place], PA-C  blood glucose meter kit and supplies KIT Check sugars twice daily, in morning when fasting and in evening before dinner Patient not taking: Reported on 10/31/2017 02/08/16   Mack Hook, MD  celecoxib (CELEBREX) 200 MG  capsule Take 1 capsule (200 mg total) by mouth 2 (two) times daily. 04/05/18   Tenna Delaine D, PA-C  glucose blood test strip Use as instructed Patient not taking: Reported on 10/31/2017 02/08/16   Mack Hook, MD  HYDROcodone-acetaminophen (NORCO/VICODIN) 5-325 MG tablet Take 1 tablet by mouth every 6 (six) hours as needed for moderate pain. Patient not taking: Reported on 04/05/2018 03/24/18   Milton Ferguson, MD  metFORMIN (GLUCOPHAGE-XR) 500 MG 24 hr tablet 1 tab by mouth daily with dinner Patient not taking: Reported on 10/31/2017 02/08/16   Mack Hook, MD  methocarbamol (ROBAXIN) 500 MG tablet Take 1 tablet (500 mg total) by mouth every 8 (eight) hours as needed for muscle spasms. Patient not taking: Reported on 10/31/2017 09/23/17   Petrucelli, Aldona Bar R, PA-C  naproxen (NAPROSYN) 500 MG tablet Take 1 tablet (500 mg total) by mouth 2 (two) times daily as needed (pain). Patient not taking: Reported on 10/31/2017 09/23/17   Petrucelli, Glynda Jaeger, PA-C  terbinafine (LAMISIL) 250 MG tablet Take 1 tablet (250 mg total) by mouth daily. Patient not taking: Reported on 10/31/2017 02/08/16   Mack Hook, MD    Family History Family History  Problem Relation Age of Onset  . Arthritis Sister   . Arthritis Brother   . Prostate cancer Brother     Social History Social History   Tobacco Use  . Smoking status: Never Smoker  . Smokeless tobacco: Never Used  Substance Use  Topics  . Alcohol use: No    Alcohol/week: 0.0 standard drinks  . Drug use: No     Allergies   Patient has no known allergies.   Review of Systems Review of Systems  Constitutional: Negative.  Negative for chills, fatigue and fever.  Musculoskeletal: Positive for arthralgias. Negative for joint swelling and myalgias.  Skin: Negative.  Negative for color change and wound.  Neurological: Negative.  Negative for weakness and numbness.     Physical Exam Updated Vital Signs BP 121/71 (BP Location:  Right Arm)   Pulse 81   Temp 98.7 F (37.1 C) (Oral)   Resp 20   SpO2 99%   Physical Exam  Constitutional: He appears well-developed and well-nourished. No distress.  HENT:  Head: Normocephalic and atraumatic.  Right Ear: External ear normal.  Left Ear: External ear normal.  Nose: Nose normal.  Eyes: Pupils are equal, round, and reactive to light. EOM are normal.  Neck: Trachea normal and normal range of motion. No tracheal deviation present.  Cardiovascular:  Pulses:      Dorsalis pedis pulses are 2+ on the right side, and 2+ on the left side.       Posterior tibial pulses are 2+ on the right side, and 2+ on the left side.  Pulmonary/Chest: Effort normal. No respiratory distress.  Abdominal: Soft. There is no tenderness. There is no rebound and no guarding.  Musculoskeletal: Normal range of motion.       Right knee: Normal. He exhibits normal range of motion, no swelling, no deformity and no erythema.       Left knee: Normal. He exhibits normal range of motion, no swelling, no deformity and no erythema.       Right ankle: Normal.       Left ankle: Normal.       Right lower leg: Normal.       Left lower leg: Normal.  Left Knee:   Appearance normal. No obvious deformity. No skin swelling, erythema, heat, fluctuance or break of the skin.  No tenderness to palpation of knee.  Active and passive flexion and extension intact with mild pain with movement.No tenderness to palpation of hips or ankles.Compartments soft. Neurovascularly intact distally to site of injury.   Feet:  Right Foot:  Protective Sensation: 3 sites tested. 3 sites sensed.  Left Foot:  Protective Sensation: 3 sites tested. 3 sites sensed.  Neurological: He is alert. He has normal strength. No sensory deficit. GCS eye subscore is 4. GCS verbal subscore is 5. GCS motor subscore is 6.  Speech is clear and goal oriented, follows commands Major Cranial nerves without deficit, no facial droop Normal strength in upper  and lower extremities bilaterally including dorsiflexion and plantar flexion, strong and equal grip strength Sensation normal to light and sharp touch Moves extremities without ataxia, coordination intact Normal gait  Skin: Skin is warm, dry and intact. Capillary refill takes less than 2 seconds. No ecchymosis noted. No erythema.  Psychiatric: He has a normal mood and affect. His behavior is normal.    ED Treatments / Results  Labs (all labs ordered are listed, but only abnormal results are displayed) Labs Reviewed - No data to display  EKG None  Radiology No results found.  Procedures Procedures (including critical care time)  Medications Ordered in ED Medications - No data to display   Initial Impression / Assessment and Plan / ED Course  I have reviewed the triage vital signs and the nursing notes.  Pertinent labs & imaging results that were available during my care of the patient were reviewed by me and considered in my medical decision making (see chart for details).   Patient presenting for medication refill of his Percocet for his chronic knee pain that he states is been present for 30 years.  No new injury or change in pain reported.  No signs of injury or infection at this time.  Patient neurovascularly intact to bilateral lower extremities and ambulatory without difficulty or assistance.  Patient has been informed that it is against policy to refill narcotic prescriptions here and that he needs to follow-up with the orthopedic doctor regarding his arthritis seen on previous imaging.  Patient states understanding of care plan and is agreeable.  Patient is afebrile, not tachycardic, not hypotensive, well-appearing and in no acute distress.  At this time there does not appear to be any evidence of an acute emergency medical condition and the patient appears stable for discharge with appropriate outpatient follow up. Diagnosis was discussed with patient who verbalizes  understanding of care plan and is agreeable to discharge. I have discussed return precautions with patient who verbalizes understanding of return precautions. Patient strongly encouraged to follow-up with their PCP. All questions answered.   Note: Portions of this report may have been transcribed using voice recognition software. Every effort was made to ensure accuracy; however, inadvertent computerized transcription errors may still be present.  Final Clinical Impressions(s) / ED Diagnoses   Final diagnoses:  Medication refill  Other chronic pain    ED Discharge Orders    None       Gari Crown 04/12/18 2145    Drenda Freeze, MD 04/13/18 [phone]

## 2018-06-25 ENCOUNTER — Ambulatory Visit (INDEPENDENT_AMBULATORY_CARE_PROVIDER_SITE_OTHER): Payer: Medicaid Other | Admitting: Emergency Medicine

## 2018-06-25 ENCOUNTER — Other Ambulatory Visit: Payer: Self-pay

## 2018-06-25 ENCOUNTER — Encounter: Payer: Self-pay | Admitting: Emergency Medicine

## 2018-06-25 VITALS — BP 116/73 | HR 73 | Temp 98.7°F | Ht 72.0 in | Wt 214.6 lb

## 2018-06-25 DIAGNOSIS — Z8739 Personal history of other diseases of the musculoskeletal system and connective tissue: Secondary | ICD-10-CM

## 2018-06-25 DIAGNOSIS — M25562 Pain in left knee: Secondary | ICD-10-CM | POA: Diagnosis not present

## 2018-06-25 MED ORDER — DICLOFENAC SODIUM 75 MG PO TBEC: 75.0000 mg | DELAYED_RELEASE_TABLET | Freq: Two times a day (BID) | ORAL | 0 refills | Status: AC

## 2018-06-25 NOTE — Patient Instructions (Addendum)
If you have lab work done today you will be contacted with your lab results within the next 2 weeks.  If you have not heard from [place] then please contact us. The fastest way to get your results is to register for My Chart.   IF you received an x-ray today, you will receive an invoice from [facility]. Please contact [facility] at [phone] with questions or concerns regarding your invoice.   IF you received labwork today, you will receive an invoice from [facility]. Please contact [facility] at [phone] with questions or concerns regarding your invoice.   Our billing staff will not be able to assist you with questions regarding bills from these companies.  You will be contacted with the lab results as soon as they are available. The fastest way to get your results is to activate your My Chart account. Instructions are located on the last page of this paperwork. If you have not heard from [place] regarding the results in 2 weeks, please contact this office.      Osteoarthritis Osteoarthritis is a type of arthritis that affects tissue that covers the ends of bones in joints (cartilage). Cartilage acts as a cushion between the bones and helps them move smoothly. Osteoarthritis results when cartilage in the joints gets worn down. Osteoarthritis is sometimes called "wear and tear" arthritis. Osteoarthritis is the most common form of arthritis. It often occurs in older people. It is a condition that gets worse over time (a progressive condition). Joints that are most often affected by this condition are in:  Fingers.  Toes.  Hips.  Knees.  Spine, including neck and lower back.  What are the causes? This condition is caused by age-related wearing down of cartilage that covers the ends of bones. What increases the risk? The following factors may make you more likely to develop this condition:  Older age.  Being overweight or obese.  Overuse of joints, such as in  athletes.  Past injury of a joint.  Past surgery on a joint.  Family history of osteoarthritis.  What are the signs or symptoms? The main symptoms of this condition are pain, swelling, and stiffness in the joint. The joint may lose its shape over time. Small pieces of bone or cartilage may break off and float inside of the joint, which may cause more pain and damage to the joint. Small deposits of bone (osteophytes) may grow on the edges of the joint. Other symptoms may include:  A grating or scraping feeling inside the joint when you move it.  Popping or creaking sounds when you move.  Symptoms may affect one or more joints. Osteoarthritis in a major joint, such as your knee or hip, can make it painful to walk or exercise. If you have osteoarthritis in your hands, you might not be able to grip items, twist your hand, or control small movements of your hands and fingers (fine motor skills). How is this diagnosed? This condition may be diagnosed based on:  Your medical history.  A physical exam.  Your symptoms.  X-rays of the affected joint(s).  Blood tests to rule out other types of arthritis.  How is this treated? There is no cure for this condition, but treatment can help to control pain and improve joint function. Treatment plans may include:  A prescribed exercise program that allows for rest and joint relief. You may work with a physical therapist.  A weight control plan.  Pain relief techniques, such as: ?  Applying heat and cold to the joint. ? Electric pulses delivered to nerve endings under the skin (transcutaneous electrical nerve stimulation, or TENS). ? Massage. ? Certain nutritional supplements.  NSAIDs or prescription medicines to help relieve pain.  Medicine to help relieve pain and inflammation (corticosteroids). This can be given by mouth (orally) or as an injection.  Assistive devices, such as a brace, wrap, splint, specialized glove, or  cane.  Surgery, such as: ? An osteotomy. This is done to reposition the bones and relieve pain or to remove loose pieces of bone and cartilage. ? Joint replacement surgery. You may need this surgery if you have very bad (advanced) osteoarthritis.  Follow these instructions at home: Activity  Rest your affected joints as directed by your health care provider.  Do not drive or use heavy machinery while taking prescription pain medicine.  Exercise as directed. Your health care provider or physical therapist may recommend specific types of exercise, such as: ? Strengthening exercises. These are done to strengthen the muscles that support joints that are affected by arthritis. They can be performed with weights or with exercise bands to add resistance. ? Aerobic activities. These are exercises, such as brisk walking or water aerobics, that get your heart pumping. ? Range-of-motion activities. These keep your joints easy to move. ? Balance and agility exercises. Managing pain, stiffness, and swelling  If directed, apply heat to the affected area as often as told by your health care provider. Use the heat source that your health care provider recommends, such as a moist heat pack or a heating pad. ? If you have a removable assistive device, remove it as told by your health care provider. ? Place a towel between your skin and the heat source. If your health care provider tells you to keep the assistive device on while you apply heat, place a towel between the assistive device and the heat source. ? Leave the heat on for 20-30 minutes. ? Remove the heat if your skin turns bright red. This is especially important if you are unable to feel pain, heat, or cold. You may have a greater risk of getting burned.  If directed, put ice on the affected joint: ? If you have a removable assistive device, remove it as told by your health care provider. ? Put ice in a plastic bag. ? Place a towel between your  skin and the bag. If your health care provider tells you to keep the assistive device on during icing, place a towel between the assistive device and the bag. ? Leave the ice on for 20 minutes, 2-3 times a day. General instructions  Take over-the-counter and prescription medicines only as told by your health care provider.  Maintain a healthy weight. Follow instructions from your health care provider for weight control. These may include dietary restrictions.  Do not use any products that contain nicotine or tobacco, such as cigarettes and e-cigarettes. These can delay bone healing. If you need help quitting, ask your health care provider.  Use assistive devices as directed by your health care provider.  Keep all follow-up visits as told by your health care provider. This is important. Where to find more information:  [employer] of Arthritis and Musculoskeletal and Skin Diseases: www.niams.SouthExposed.es  [employer] on Aging: http://kim-miller.com/  American College of Rheumatology: www.rheumatology.org Contact a health care provider if:  Your skin turns red.  You develop a rash.  You have pain that gets worse.  You have a fever along  with joint or muscle aches. Get help right away if:  You lose a lot of weight.  You suddenly lose your appetite.  You have night sweats. Summary  Osteoarthritis is a type of arthritis that affects tissue covering the ends of bones in joints (cartilage).  This condition is caused by age-related wearing down of cartilage that covers the ends of bones.  The main symptom of this condition is pain, swelling, and stiffness in the joint.  There is no cure for this condition, but treatment can help to control pain and improve joint function. This information is not intended to replace advice given to you by your health care provider. Make sure you discuss any questions you have with your health care provider. Document Released: 07/11/2005  Document Revised: 03/14/2016 Document Reviewed: 03/14/2016 Elsevier Interactive Patient Education  [employer].

## 2018-06-25 NOTE — Progress Notes (Signed)
Samuel [name] 62 y.o.   Chief Complaint  Patient presents with  . Joint Swelling    knees and thumb    HISTORY OF PRESENT ILLNESS: This is a 62 y.o. male with history of type 2 diabetes and osteoarthritis complaining of 3-day history of left knee pain with some swelling to both knees and left thumb.  Denies any injuries.  First visit with me.  We are not his primary care physician.  No other significant symptoms.  Pain is sharp and constant, worse with movement, no radiation, no associated symptoms.  HPI   Prior to Admission medications   Medication Sig Start Date End Date Taking? Authorizing Provider  acetaminophen (TYLENOL) 500 MG tablet Take 1 tablet (500 mg total) by mouth every 6 (six) hours as needed. 10/05/14  Yes Piepenbrink, Anderson Malta, PA-C  celecoxib (CELEBREX) 200 MG capsule Take 1 capsule (200 mg total) by mouth 2 (two) times daily. 04/05/18  Yes Tenna Delaine D, PA-C  HYDROcodone-acetaminophen (NORCO/VICODIN) 5-325 MG tablet Take 1 tablet by mouth every 6 (six) hours as needed for moderate pain. 03/24/18  Yes Milton Ferguson, MD  metFORMIN (GLUCOPHAGE-XR) 500 MG 24 hr tablet 1 tab by mouth daily with dinner 02/08/16  Yes Mack Hook, MD  methocarbamol (ROBAXIN) 500 MG tablet Take 1 tablet (500 mg total) by mouth every 8 (eight) hours as needed for muscle spasms. 09/23/17  Yes Petrucelli, Samantha R, PA-C  naproxen (NAPROSYN) 500 MG tablet Take 1 tablet (500 mg total) by mouth 2 (two) times daily as needed (pain). 09/23/17  Yes Petrucelli, Samantha R, PA-C  terbinafine (LAMISIL) 250 MG tablet Take 1 tablet (250 mg total) by mouth daily. 02/08/16  Yes Mack Hook, MD  blood glucose meter kit and supplies KIT Check sugars twice daily, in morning when fasting and in evening before dinner Patient not taking: Reported on 10/31/2017 02/08/16   Mack Hook, MD  glucose blood test strip Use as instructed Patient not taking: Reported on 10/31/2017 02/08/16   Mack Hook, MD    No Known Allergies  Patient Active Problem List   Diagnosis Date Noted  . Unspecified vitamin D deficiency 03/20/2014  . Dental cavities 03/20/2014  . IFG (impaired fasting glucose) 02/26/2014  . Multinodular goiter 02/26/2014  . Goiter     Past Medical History:  Diagnosis Date  . Arthritis    Knees  . Diabetes mellitus without complication ([place])   . Goiter    Multinodular Goiter with partial thyroidectomey in [place]  1998.  Ultrasound confirming diagnosis in 01/17/2014    Past Surgical History:  Procedure Laterality Date  . APPENDECTOMY  2014  . THYROID SURGERY  1998   Enlarged and painful thyroid    Social History   Socioeconomic History  . Marital status: Married    Spouse name: Magda  . Number of children: 5  . Years of education: Not on file  . Highest education level: Not on file  Occupational History  . Occupation: unemployed currently  Social Needs  . Financial resource strain: Not on file  . Food insecurity:    Worry: Not on file    Inability: Not on file  . Transportation needs:    Medical: Not on file    Non-medical: Not on file  Tobacco Use  . Smoking status: Never Smoker  . Smokeless tobacco: Never Used  Substance and Sexual Activity  . Alcohol use: No    Alcohol/week: 0.0 standard drinks  . Drug use: No  . Sexual activity:  Yes  Lifestyle  . Physical activity:    Days per week: Not on file    Minutes per session: Not on file  . Stress: Not on file  Relationships  . Social connections:    Talks on phone: Not on file    Gets together: Not on file    Attends religious service: Not on file    Active member of club or organization: Not on file    Attends meetings of clubs or organizations: Not on file    Relationship status: Not on file  . Intimate partner violence:    Fear of current or ex partner: Not on file    Emotionally abused: Not on file    Physically abused: Not on file    Forced sexual activity: Not on file    Other Topics Concern  . Not on file  Social History Narrative   Originally from [place]   Came to [place]. 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   First language is Arabic.    Family History  Problem Relation Age of Onset  . Arthritis Sister   . Arthritis Brother   . Prostate cancer Brother      Review of Systems  Constitutional: Negative.  Negative for chills, fever and weight loss.  HENT: Negative.   Eyes: Negative.   Respiratory: Negative.  Negative for cough and shortness of breath.   Cardiovascular: Negative.  Negative for chest pain and palpitations.  Gastrointestinal: Negative.  Negative for abdominal pain, blood in stool, diarrhea, melena, nausea and vomiting.  Genitourinary: Negative.  Negative for dysuria and hematuria.  Musculoskeletal: Positive for joint pain (Multiple).  Skin: Negative.  Negative for rash.  Neurological: Negative.  Negative for dizziness and headaches.  Endo/Heme/Allergies: Negative.   All other systems reviewed and are negative.   Vitals:   06/25/18 0810  BP: 116/73  Pulse: 73  Temp: 98.7 F (37.1 C)  SpO2: 98%    Physical Exam  Constitutional: He is oriented to person, place, and time. He appears well-developed and well-nourished.  HENT:  Head: Normocephalic and atraumatic.  Eyes: Pupils are equal, round, and reactive to light. EOM are normal.  Neck: Normal range of motion. Neck supple.  Cardiovascular: Normal rate, regular rhythm and normal heart sounds.  Pulmonary/Chest: Effort normal and breath sounds normal.  Musculoskeletal:  Left knee: No bruising or erythema.  No visible swelling.  No tenderness.  Full range of motion.  Stable in flexion and extension. Left thumb: Slight trapping, clicking, IP joint with mild swelling. No tenderness to wrists elbows or shoulders.  Full range of motion.  Neurological: He is alert and oriented to person, place,  and time. No sensory deficit. He exhibits normal muscle tone.  Skin: Skin is warm and dry.  Vitals reviewed.  A total of 25 minutes was spent in the room with the patient, greater than 50% of which was in counseling/coordination of care regarding differential diagnosis, treatment, medications, and need for follow-up.   ASSESSMENT & PLAN: Eberardo was seen today for joint swelling.  Diagnoses and all orders for this visit:  Arthralgia of left knee -     diclofenac (VOLTAREN) 75 MG EC tablet; Take 1 tablet (75 mg total) by mouth 2 (two) times daily for 5 days. After 5 days take as needed.  History of osteoarthritis    Patient Instructions  If you have lab work done today you will be contacted with your lab results within the next 2 weeks.  If you have not heard from [place] then please contact us. The fastest way to get your results is to register for My Chart.   IF you received an x-ray today, you will receive an invoice from [facility]. Please contact [facility] at [phone] with questions or concerns regarding your invoice.   IF you received labwork today, you will receive an invoice from [place]. Please contact [facility] at [phone] with questions or concerns regarding your invoice.   Our billing staff will not be able to assist you with questions regarding bills from these companies.  You will be contacted with the lab results as soon as they are available. The fastest way to get your results is to activate your My Chart account. Instructions are located on the last page of this paperwork. If you have not heard from [place] regarding the results in 2 weeks, please contact this office.      Osteoarthritis Osteoarthritis is a type of arthritis that affects tissue that covers the ends of bones in joints (cartilage). Cartilage acts as a cushion between the bones and helps them move smoothly. Osteoarthritis results when cartilage in the joints gets worn down.  Osteoarthritis is sometimes called "wear and tear" arthritis. Osteoarthritis is the most common form of arthritis. It often occurs in older people. It is a condition that gets worse over time (a progressive condition). Joints that are most often affected by this condition are in:  Fingers.  Toes.  Hips.  Knees.  Spine, including neck and lower back.  What are the causes? This condition is caused by age-related wearing down of cartilage that covers the ends of bones. What increases the risk? The following factors may make you more likely to develop this condition:  Older age.  Being overweight or obese.  Overuse of joints, such as in athletes.  Past injury of a joint.  Past surgery on a joint.  Family history of osteoarthritis.  What are the signs or symptoms? The main symptoms of this condition are pain, swelling, and stiffness in the joint. The joint may lose its shape over time. Small pieces of bone or cartilage may break off and float inside of the joint, which may cause more pain and damage to the joint. Small deposits of bone (osteophytes) may grow on the edges of the joint. Other symptoms may include:  A grating or scraping feeling inside the joint when you move it.  Popping or creaking sounds when you move.  Symptoms may affect one or more joints. Osteoarthritis in a major joint, such as your knee or hip, can make it painful to walk or exercise. If you have osteoarthritis in your hands, you might not be able to grip items, twist your hand, or control small movements of your hands and fingers (fine motor skills). How is this diagnosed? This condition may be diagnosed based on:  Your medical history.  A physical exam.  Your symptoms.  X-rays of the affected joint(s).  Blood tests to rule out other types of arthritis.  How is this treated? There is no cure for this condition, but treatment can help to control pain and improve joint function. Treatment plans may  include:  A prescribed exercise program that allows for rest and joint relief. You may work with a physical therapist.  A weight control plan.  Pain relief techniques, such as: ? Applying  heat and cold to the joint. ? Electric pulses delivered to nerve endings under the skin (transcutaneous electrical nerve stimulation, or TENS). ? Massage. ? Certain nutritional supplements.  NSAIDs or prescription medicines to help relieve pain.  Medicine to help relieve pain and inflammation (corticosteroids). This can be given by mouth (orally) or as an injection.  Assistive devices, such as a brace, wrap, splint, specialized glove, or cane.  Surgery, such as: ? An osteotomy. This is done to reposition the bones and relieve pain or to remove loose pieces of bone and cartilage. ? Joint replacement surgery. You may need this surgery if you have very bad (advanced) osteoarthritis.  Follow these instructions at home: Activity  Rest your affected joints as directed by your health care provider.  Do not drive or use heavy machinery while taking prescription pain medicine.  Exercise as directed. Your health care provider or physical therapist may recommend specific types of exercise, such as: ? Strengthening exercises. These are done to strengthen the muscles that support joints that are affected by arthritis. They can be performed with weights or with exercise bands to add resistance. ? Aerobic activities. These are exercises, such as brisk walking or water aerobics, that get your heart pumping. ? Range-of-motion activities. These keep your joints easy to move. ? Balance and agility exercises. Managing pain, stiffness, and swelling  If directed, apply heat to the affected area as often as told by your health care provider. Use the heat source that your health care provider recommends, such as a moist heat pack or a heating pad. ? If you have a removable assistive device, remove it as told by your  health care provider. ? Place a towel between your skin and the heat source. If your health care provider tells you to keep the assistive device on while you apply heat, place a towel between the assistive device and the heat source. ? Leave the heat on for 20-30 minutes. ? Remove the heat if your skin turns bright red. This is especially important if you are unable to feel pain, heat, or cold. You may have a greater risk of getting burned.  If directed, put ice on the affected joint: ? If you have a removable assistive device, remove it as told by your health care provider. ? Put ice in a plastic bag. ? Place a towel between your skin and the bag. If your health care provider tells you to keep the assistive device on during icing, place a towel between the assistive device and the bag. ? Leave the ice on for 20 minutes, 2-3 times a day. General instructions  Take over-the-counter and prescription medicines only as told by your health care provider.  Maintain a healthy weight. Follow instructions from your health care provider for weight control. These may include dietary restrictions.  Do not use any products that contain nicotine or tobacco, such as cigarettes and e-cigarettes. These can delay bone healing. If you need help quitting, ask your health care provider.  Use assistive devices as directed by your health care provider.  Keep all follow-up visits as told by your health care provider. This is important. Where to find more information:  [employer] of Arthritis and Musculoskeletal and Skin Diseases: www.niams.SouthExposed.es  [employer] on Aging: http://kim-miller.com/  American College of Rheumatology: www.rheumatology.org Contact a health care provider if:  Your skin turns red.  You develop a rash.  You have pain that gets worse.  You have a fever along with joint  or muscle aches. Get help right away if:  You lose a lot of weight.  You suddenly lose your  appetite.  You have night sweats. Summary  Osteoarthritis is a type of arthritis that affects tissue covering the ends of bones in joints (cartilage).  This condition is caused by age-related wearing down of cartilage that covers the ends of bones.  The main symptom of this condition is pain, swelling, and stiffness in the joint.  There is no cure for this condition, but treatment can help to control pain and improve joint function. This information is not intended to replace advice given to you by your health care provider. Make sure you discuss any questions you have with your health care provider. Document Released: 07/11/2005 Document Revised: 03/14/2016 Document Reviewed: 03/14/2016 Elsevier Interactive Patient Education  2018 Elsevier Inc.      Agustina Caroli, MD [facility]

## 2018-07-06 ENCOUNTER — Emergency Department (HOSPITAL_COMMUNITY): Payer: Medicaid Other

## 2018-07-06 ENCOUNTER — Other Ambulatory Visit: Payer: Self-pay

## 2018-07-06 ENCOUNTER — Emergency Department (HOSPITAL_COMMUNITY)
Admission: EM | Admit: 2018-07-06 | Discharge: 2018-07-06 | Disposition: A | Discharge status: Home / Self Care | Payer: Medicaid Other | Attending: Emergency Medicine | Admitting: Emergency Medicine

## 2018-07-06 DIAGNOSIS — M79645 Pain in left finger(s): Secondary | ICD-10-CM | POA: Diagnosis not present

## 2018-07-06 DIAGNOSIS — E119 Type 2 diabetes mellitus without complications: Secondary | ICD-10-CM | POA: Diagnosis not present

## 2018-07-06 DIAGNOSIS — Z79899 Other long term (current) drug therapy: Secondary | ICD-10-CM | POA: Diagnosis not present

## 2018-07-06 DIAGNOSIS — Z7984 Long term (current) use of oral hypoglycemic drugs: Secondary | ICD-10-CM | POA: Diagnosis not present

## 2018-07-06 MED ORDER — TRAMADOL HCL 50 MG PO TABS: 50.0000 mg | ORAL_TABLET | Freq: Four times a day (QID) | ORAL | 0 refills | Status: DC | PRN

## 2018-07-06 NOTE — ED Triage Notes (Signed)
Pt. Reports having pain to his lt. Thumb.  Denies any injury.  No swelling, deformity noted  Pt. Reports that he cannot move his thumb due to pain

## 2018-07-06 NOTE — ED Notes (Signed)
Patient verbalizes understanding of discharge instructions. Opportunity for questioning and answers were provided. Armband removed by staff, pt discharged from ED.  

## 2018-07-06 NOTE — Discharge Instructions (Addendum)
You were evaluated in the emergency department for pain and difficulty bending your thumb.  There is probably some arthritis or inflammation in the thumb joint that is limiting the movement.  We are providing you with a splint to use for comfort and you should take ibuprofen 3 times a day.  We are also prescribing you some medicine for pain in the meantime.  Please schedule a follow-up appointment with your physician for further evaluation.

## 2018-07-06 NOTE — ED Provider Notes (Signed)
[facility] Provider Note   CSN: [id number] Arrival date & time: 07/06/18  1537     History   Chief Complaint Chief Complaint  Patient presents with  . Hand Pain    HPI Samuel [name] is a 62 y.o. male.  He has a history of arthritis and has pains in many of his joints.  He presents today complaining of 1 week of pain in his left thumb at both the proximal and distal joint.  He says it is harder to flex the thumb secondary to the pain.  No trauma no fever.  He is tried nothing for it.  He said that his thumb does not bother him before but he has had problems in other joints.  The history is provided by the patient.  Hand Pain  This is a new problem. The current episode started more than 1 week ago. The problem occurs constantly. The problem has not changed since onset.Pertinent negatives include no chest pain, no abdominal pain, no headaches and no shortness of breath. The symptoms are aggravated by bending. Nothing relieves the symptoms. He has tried nothing for the symptoms. The treatment provided no relief.    Past Medical History:  Diagnosis Date  . Arthritis    Knees  . Diabetes mellitus without complication ([place])   . Goiter    Multinodular Goiter with partial thyroidectomey in [place]  1998.  Ultrasound confirming diagnosis in 01/17/2014    Patient Active Problem List   Diagnosis Date Noted  . Unspecified vitamin D deficiency 03/20/2014  . Dental cavities 03/20/2014  . IFG (impaired fasting glucose) 02/26/2014  . Multinodular goiter 02/26/2014  . Goiter     Past Surgical History:  Procedure Laterality Date  . APPENDECTOMY  2014  . THYROID SURGERY  1998   Enlarged and painful thyroid        Home Medications    Prior to Admission medications   Medication Sig Start Date End Date Taking? Authorizing Provider  acetaminophen (TYLENOL) 500 MG tablet Take 1 tablet (500 mg total) by mouth every 6 (six) hours as needed. 10/05/14    Piepenbrink, Anderson Malta, PA-C  blood glucose meter kit and supplies KIT Check sugars twice daily, in morning when fasting and in evening before dinner Patient not taking: Reported on 10/31/2017 02/08/16   Mack Hook, MD  celecoxib (CELEBREX) 200 MG capsule Take 1 capsule (200 mg total) by mouth 2 (two) times daily. 04/05/18   Tenna Delaine D, PA-C  glucose blood test strip Use as instructed Patient not taking: Reported on 10/31/2017 02/08/16   Mack Hook, MD  HYDROcodone-acetaminophen (NORCO/VICODIN) 5-325 MG tablet Take 1 tablet by mouth every 6 (six) hours as needed for moderate pain. 03/24/18   Milton Ferguson, MD  metFORMIN (GLUCOPHAGE-XR) 500 MG 24 hr tablet 1 tab by mouth daily with dinner 02/08/16   Mack Hook, MD  methocarbamol (ROBAXIN) 500 MG tablet Take 1 tablet (500 mg total) by mouth every 8 (eight) hours as needed for muscle spasms. 09/23/17   Petrucelli, Samantha R, PA-C  naproxen (NAPROSYN) 500 MG tablet Take 1 tablet (500 mg total) by mouth 2 (two) times daily as needed (pain). 09/23/17   Petrucelli, Samantha R, PA-C  terbinafine (LAMISIL) 250 MG tablet Take 1 tablet (250 mg total) by mouth daily. 02/08/16   Mack Hook, MD    Family History Family History  Problem Relation Age of Onset  . Arthritis Sister   . Arthritis Brother   . Prostate  cancer Brother     Social History Social History   Tobacco Use  . Smoking status: Never Smoker  . Smokeless tobacco: Never Used  Substance Use Topics  . Alcohol use: No    Alcohol/week: 0.0 standard drinks  . Drug use: No     Allergies   Patient has no known allergies.   Review of Systems Review of Systems  Constitutional: Negative for fever.  HENT: Negative for sore throat.   Eyes: Negative for visual disturbance.  Respiratory: Negative for shortness of breath.   Cardiovascular: Negative for chest pain.  Gastrointestinal: Negative for abdominal pain.  Genitourinary: Negative for dysuria.    Musculoskeletal: Positive for arthralgias.  Skin: Negative for rash and wound.  Neurological: Negative for headaches.     Physical Exam Updated Vital Signs BP 113/69   Pulse 61   Temp 97.9 F (36.6 C) (Oral)   Resp 16   SpO2 98%   Physical Exam Vitals signs and nursing note reviewed.  Constitutional:      Appearance: He is well-developed.  HENT:     Head: Normocephalic and atraumatic.  Eyes:     Conjunctiva/sclera: Conjunctivae normal.  Neck:     Musculoskeletal: Neck supple.  Pulmonary:     Effort: Pulmonary effort is normal.  Musculoskeletal:        General: Tenderness present. No swelling, deformity or signs of injury.     Comments: He has pain diffusely through his left thumb although it is mostly at the MCP and IP.  He has full active and passive range of motion.  Cap refill and sensory intact.  Wrist nontender.  No overlying erythema or swelling.  Skin:    General: Skin is warm and dry.  Neurological:     Mental Status: He is alert.     GCS: GCS eye subscore is 4. GCS verbal subscore is 5. GCS motor subscore is 6.      ED Treatments / Results  Labs (all labs ordered are listed, but only abnormal results are displayed) Labs Reviewed - No data to display  EKG None  Radiology Dg Finger Thumb Left  Result Date: 07/06/2018 CLINICAL DATA:  LEFT thumb pain for 2 weeks, no injury EXAM: LEFT THUMB 2+V COMPARISON:  LEFT hand radiographs 09/23/2017 FINDINGS: Osseous mineralization normal. Joint spaces preserved. No fracture, dislocation, or bone destruction. IMPRESSION: Normal exam. Electronically Signed   By: Lavonia Dana M.D.   On: 07/06/2018 17:44    Procedures Procedures (including critical care time)  Medications Ordered in ED Medications - No data to display   Initial Impression / Assessment and Plan / ED Course  I have reviewed the triage vital signs and the nursing notes.  Pertinent labs & imaging results that were available during my care of the  patient were reviewed by me and considered in my medical decision making (see chart for details).   patient placed in thumb spica removable splint. Instructed to followup with pcp.    Final Clinical Impressions(s) / ED Diagnoses   Final diagnoses:  Pain of left thumb    ED Discharge Orders         Ordered    traMADol (ULTRAM) 50 MG tablet  Every 6 hours PRN     07/06/18 1751           Hayden Rasmussen, MD 07/07/18 [phone]

## 2018-08-05 ENCOUNTER — Other Ambulatory Visit: Payer: Self-pay

## 2018-08-05 ENCOUNTER — Ambulatory Visit (HOSPITAL_COMMUNITY)
Admission: EM | Admit: 2018-08-05 | Discharge: 2018-08-05 | Disposition: A | Discharge status: Home / Self Care | Payer: Medicaid Other | Attending: Family Medicine | Admitting: Family Medicine

## 2018-08-05 ENCOUNTER — Encounter (HOSPITAL_COMMUNITY): Payer: Self-pay | Admitting: *Deleted

## 2018-08-05 DIAGNOSIS — G894 Chronic pain syndrome: Secondary | ICD-10-CM | POA: Diagnosis not present

## 2018-08-05 MED ORDER — TRAMADOL HCL 50 MG PO TABS: 50.0000 mg | ORAL_TABLET | Freq: Four times a day (QID) | ORAL | 0 refills | Status: DC | PRN

## 2018-08-05 NOTE — ED Provider Notes (Signed)
[facility]    CSN: [id number] Arrival date & time: 08/05/18  1318     History   Chief Complaint Chief Complaint  Patient presents with  . Joint Pain    HPI Samuel [name] is a 63 y.o. male.   Patient is a 63 year old male with past medical history of arthritis, diabetes.  He presents with chronic pain.  The pain is located in his joints.  The worst pain is in his left thumb.  He has been seen and evaluated for this multiple times for many months.  He has been given multiple different medications to include narcotics, Celebrex, meloxicam, diclofenac which he reports only helps temporary with his symptoms. The last medication he was given was meloxicam which he reports does not help at all. He has been taking this for a week. He has not been bracing the left thumb because the brace he has is too big. He does do repetitive movement with hands at work. He denies any fever, numbness, tingling. He denies any new or recent injuries. He has already had multiple x rays done of problem areas which revealed degenerative changes and osteoarthritis.   ROS per HPI      Past Medical History:  Diagnosis Date  . Arthritis    Knees  . Diabetes mellitus without complication ([place])   . Goiter    Multinodular Goiter with partial thyroidectomey in [place]  1998.  Ultrasound confirming diagnosis in 01/17/2014    Patient Active Problem List   Diagnosis Date Noted  . Unspecified vitamin D deficiency 03/20/2014  . Dental cavities 03/20/2014  . IFG (impaired fasting glucose) 02/26/2014  . Multinodular goiter 02/26/2014  . Goiter     Past Surgical History:  Procedure Laterality Date  . APPENDECTOMY  2014  . THYROID SURGERY  1998   Enlarged and painful thyroid       Home Medications    Prior to Admission medications   Medication Sig Start Date End Date Taking? Authorizing Provider  acetaminophen (TYLENOL) 500 MG tablet Take 1 tablet (500 mg total) by mouth every 6 (six) hours as  needed. 10/05/14   Piepenbrink, Anderson Malta, PA-C  blood glucose meter kit and supplies KIT Check sugars twice daily, in morning when fasting and in evening before dinner Patient not taking: Reported on 10/31/2017 02/08/16   Mack Hook, MD  celecoxib (CELEBREX) 200 MG capsule Take 1 capsule (200 mg total) by mouth 2 (two) times daily. 04/05/18   Tenna Delaine D, PA-C  glucose blood test strip Use as instructed Patient not taking: Reported on 10/31/2017 02/08/16   Mack Hook, MD  HYDROcodone-acetaminophen (NORCO/VICODIN) 5-325 MG tablet Take 1 tablet by mouth every 6 (six) hours as needed for moderate pain. 03/24/18   Milton Ferguson, MD  metFORMIN (GLUCOPHAGE-XR) 500 MG 24 hr tablet 1 tab by mouth daily with dinner 02/08/16   Mack Hook, MD  methocarbamol (ROBAXIN) 500 MG tablet Take 1 tablet (500 mg total) by mouth every 8 (eight) hours as needed for muscle spasms. 09/23/17   Petrucelli, Samantha R, PA-C  naproxen (NAPROSYN) 500 MG tablet Take 1 tablet (500 mg total) by mouth 2 (two) times daily as needed (pain). 09/23/17   Petrucelli, Samantha R, PA-C  terbinafine (LAMISIL) 250 MG tablet Take 1 tablet (250 mg total) by mouth daily. 02/08/16   Mack Hook, MD  traMADol (ULTRAM) 50 MG tablet Take 1 tablet (50 mg total) by mouth every 6 (six) hours as needed. 08/05/18   Orvan July,  NP    Family History Family History  Problem Relation Age of Onset  . Arthritis Sister   . Arthritis Brother   . Prostate cancer Brother     Social History Social History   Tobacco Use  . Smoking status: Never Smoker  . Smokeless tobacco: Never Used  Substance Use Topics  . Alcohol use: No    Alcohol/week: 0.0 standard drinks  . Drug use: No     Allergies   Patient has no known allergies.   Review of Systems Review of Systems   Physical Exam Triage Vital Signs ED Triage Vitals  Enc Vitals Group     BP 08/05/18 1337 114/79     Pulse Rate 08/05/18 1337 65     Resp  08/05/18 1337 16     Temp 08/05/18 1337 98.8 F (37.1 C)     Temp Source 08/05/18 1337 Oral     SpO2 08/05/18 1337 98 %     Weight --      Height --      Head Circumference --      Peak Flow --      Pain Score 08/05/18 1339 8     Pain Loc --      Pain Edu? --      Excl. in [place]? --    No data found.  Updated Vital Signs BP 114/79   Pulse 65   Temp 98.8 F (37.1 C) (Oral)   Resp 16   SpO2 98%   Visual Acuity Right Eye Distance:   Left Eye Distance:   Bilateral Distance:    Right Eye Near:   Left Eye Near:    Bilateral Near:     Physical Exam Vitals signs and nursing note reviewed.  Constitutional:      General: He is not in acute distress.    Appearance: Normal appearance. He is not ill-appearing, toxic-appearing or diaphoretic.  HENT:     Head: Normocephalic and atraumatic.     Nose: Nose normal.  Eyes:     Conjunctiva/sclera: Conjunctivae normal.  Neck:     Musculoskeletal: Normal range of motion.  Pulmonary:     Effort: Pulmonary effort is normal.  Musculoskeletal: Normal range of motion.     Comments: Tenderness to palpation of the left thumb mostly at the MCP and PIP. No swelling, erythema.  Negative finkelstein. No wrist pain or swelling.   Pulse and sensation intact with good ROM.   Skin:    General: Skin is warm and dry.  Neurological:     Mental Status: He is alert.  Psychiatric:        Mood and Affect: Mood normal.      UC Treatments / Results  Labs (all labs ordered are listed, but only abnormal results are displayed) Labs Reviewed - No data to display  EKG None  Radiology No results found.  Procedures Procedures (including critical care time)  Medications Ordered in UC Medications - No data to display  Initial Impression / Assessment and Plan / UC Course  I have reviewed the triage vital signs and the nursing notes.  Pertinent labs & imaging results that were available during my care of the patient were reviewed by me and  considered in my medical decision making (see chart for details).     Pt is a 63 year old male that presents with chronic pain.  He has been given multiple different medications that he reports does not help him.  He has  ben taking meloxicam the past week without relief.  Brace given in the clinic I will give him a few tramadol to help with the pain. He reports this has helped before.  He needs to follow up with ortho or pain management for further evaluation.  Instructed to call his PCP tomorrow for referral.  Pt understanding and agreed.  Final Clinical Impressions(s) / UC Diagnoses   Final diagnoses:  Chronic pain syndrome     Discharge Instructions     I will give you a brace to wear here in a few pain pills to get you through the next day or 2.  This is a chronic issue for you and you have tried multiple different medications that are not seeming to help. The best medication for the type of pain you are having is anti-inflammatory pain medication and not narcotics. You need to follow-up with your primary care for a referral to either pain management or orthopedic for further evaluation and treatment. Please call your doctor in the morning for further management    ED Prescriptions    Medication Sig Dispense Auth. Provider   traMADol (ULTRAM) 50 MG tablet Take 1 tablet (50 mg total) by mouth every 6 (six) hours as needed. 12 tablet Loura Halt A, NP     Controlled Substance Prescriptions [place] Controlled Substance Registry consulted? yes   Orvan July, NP 08/05/18 2122

## 2018-08-05 NOTE — Discharge Instructions (Signed)
I will give you a brace to wear here in a few pain pills to get you through the next day or 2.  This is a chronic issue for you and you have tried multiple different medications that are not seeming to help. The best medication for the type of pain you are having is anti-inflammatory pain medication and not narcotics. You need to follow-up with your primary care for a referral to either pain management or orthopedic for further evaluation and treatment. Please call your doctor in the morning for further management

## 2018-08-05 NOTE — ED Triage Notes (Signed)
C/O left thumb pain without injury x approx 3 wks.  Also c/o intermittent joint pain to entire body.

## 2018-08-11 ENCOUNTER — Encounter (HOSPITAL_COMMUNITY): Payer: Self-pay

## 2018-08-11 ENCOUNTER — Other Ambulatory Visit: Payer: Self-pay

## 2018-08-11 ENCOUNTER — Emergency Department (HOSPITAL_COMMUNITY)
Admission: EM | Admit: 2018-08-11 | Discharge: 2018-08-11 | Disposition: A | Discharge status: Home / Self Care | Payer: Medicaid Other | Attending: Emergency Medicine | Admitting: Emergency Medicine

## 2018-08-11 DIAGNOSIS — Z7984 Long term (current) use of oral hypoglycemic drugs: Secondary | ICD-10-CM | POA: Diagnosis not present

## 2018-08-11 DIAGNOSIS — M25562 Pain in left knee: Secondary | ICD-10-CM | POA: Diagnosis not present

## 2018-08-11 DIAGNOSIS — M25561 Pain in right knee: Secondary | ICD-10-CM | POA: Diagnosis not present

## 2018-08-11 DIAGNOSIS — R110 Nausea: Secondary | ICD-10-CM | POA: Insufficient documentation

## 2018-08-11 DIAGNOSIS — E119 Type 2 diabetes mellitus without complications: Secondary | ICD-10-CM | POA: Insufficient documentation

## 2018-08-11 DIAGNOSIS — R002 Palpitations: Secondary | ICD-10-CM | POA: Insufficient documentation

## 2018-08-11 DIAGNOSIS — G8929 Other chronic pain: Secondary | ICD-10-CM | POA: Insufficient documentation

## 2018-08-11 LAB — CBC WITH DIFFERENTIAL/PLATELET
ABS IMMATURE GRANULOCYTES: 0.01 10*3/uL (ref 0.00–0.07)
BASOS PCT: 0 %
Basophils Absolute: 0 10*3/uL (ref 0.0–0.1)
Eosinophils Absolute: 0.1 10*3/uL (ref 0.0–0.5)
Eosinophils Relative: 3 %
HCT: 43.4 % (ref 39.0–52.0)
Hemoglobin: 13.4 g/dL (ref 13.0–17.0)
IMMATURE GRANULOCYTES: 0 %
Lymphocytes Relative: 34 %
Lymphs Abs: 1.6 10*3/uL (ref 0.7–4.0)
MCH: 26.7 pg (ref 26.0–34.0)
MCHC: 30.9 g/dL (ref 30.0–36.0)
MCV: 86.6 fL (ref 80.0–100.0)
Monocytes Absolute: 0.7 10*3/uL (ref 0.1–1.0)
Monocytes Relative: 15 %
NEUTROS ABS: 2.3 10*3/uL (ref 1.7–7.7)
NEUTROS PCT: 48 %
Platelets: 243 10*3/uL (ref 150–400)
RBC: 5.01 MIL/uL (ref 4.22–5.81)
RDW: 13.6 % (ref 11.5–15.5)
WBC: 4.7 10*3/uL (ref 4.0–10.5)
nRBC: 0 % (ref 0.0–0.2)

## 2018-08-11 LAB — BASIC METABOLIC PANEL
ANION GAP: 9 (ref 5–15)
BUN: 17 mg/dL (ref 8–23)
CO2: 24 mmol/L (ref 22–32)
Calcium: 9 mg/dL (ref 8.9–10.3)
Chloride: 109 mmol/L (ref 98–111)
Creatinine, Ser: 0.89 mg/dL (ref 0.61–1.24)
GFR calc Af Amer: >60 mL/min (ref >60)
Glucose, Bld: 110 mg/dL — ABNORMAL HIGH (ref 70–99)
POTASSIUM: 3.6 mmol/L (ref 3.5–5.1)
SODIUM: 142 mmol/L (ref 135–145)

## 2018-08-11 MED ORDER — NAPROXEN 500 MG PO TABS: 500.0000 mg | ORAL_TABLET | Freq: Two times a day (BID) | ORAL | 0 refills | Status: DC

## 2018-08-11 NOTE — Discharge Instructions (Addendum)
Naproxen as prescribed.  Follow-up with rheumatology to discuss your joint pain.  The contact information for the rheumatology clinic has been provided in this discharge summary for you to call and make this appointment.  Return to the emergency department if your symptoms significantly worsen or change.

## 2018-08-11 NOTE — ED Triage Notes (Addendum)
Pt c/o joint pain and palpitations x2 weeks. Pt also c/o left thumb pain. Pt denies SOB and CP. Skin warm and dry, A+Ox4, in NAD during triage. Pt denies pain or palpitations at this time.

## 2018-08-11 NOTE — ED Notes (Signed)
Patient verbalizes understanding of discharge instructions. Opportunity for questioning and answers were provided. Armband removed by staff, pt discharged from ED.  

## 2018-08-11 NOTE — ED Provider Notes (Signed)
[facility] DEPART[facility] Provider Note   CSN: [id number] Arrival date & time: 08/11/18  1212     History   Chief Complaint Chief Complaint  Patient presents with  . Palpitations  . Joint Pain    HPI Samuel [name] is a 63 y.o. male.  Patient is a 63 year old male with past medical history of diabetes and chronic pain in his knees and thumbs.  He presents today for evaluation of palpitations.  He states he woke from sleep this morning with his heart racing and feeling nauseated.  As the morning went on, he developed increasing pain in his left thumb and now in his right thumb.  He also reports pain in his knees.  From reviewing his record, he has been to the ER hand doctor's office multiple times for evaluation of pain in his knees and thumbs.  I do not see where he has been worked up for any sort of rheumatologic condition.  The history is provided by the patient.  Palpitations  Palpitations quality:  Regular Onset quality:  Sudden Duration:  1 hour Timing:  Constant Progression:  Resolved Chronicity:  New Context: not anxiety and not caffeine   Relieved by:  Nothing Worsened by:  Nothing Ineffective treatments:  None tried   Past Medical History:  Diagnosis Date  . Arthritis    Knees  . Diabetes mellitus without complication ([place])   . Goiter    Multinodular Goiter with partial thyroidectomey in [place]  1998.  Ultrasound confirming diagnosis in 01/17/2014    Patient Active Problem List   Diagnosis Date Noted  . Unspecified vitamin D deficiency 03/20/2014  . Dental cavities 03/20/2014  . IFG (impaired fasting glucose) 02/26/2014  . Multinodular goiter 02/26/2014  . Goiter     Past Surgical History:  Procedure Laterality Date  . APPENDECTOMY  2014  . THYROID SURGERY  1998   Enlarged and painful thyroid        Home Medications    Prior to Admission medications   Medication Sig Start Date End Date Taking? Authorizing Provider    acetaminophen (TYLENOL) 500 MG tablet Take 1 tablet (500 mg total) by mouth every 6 (six) hours as needed. 10/05/14   Piepenbrink, Anderson Malta, PA-C  blood glucose meter kit and supplies KIT Check sugars twice daily, in morning when fasting and in evening before dinner Patient not taking: Reported on 10/31/2017 02/08/16   Mack Hook, MD  celecoxib (CELEBREX) 200 MG capsule Take 1 capsule (200 mg total) by mouth 2 (two) times daily. 04/05/18   Tenna Delaine D, PA-C  glucose blood test strip Use as instructed Patient not taking: Reported on 10/31/2017 02/08/16   Mack Hook, MD  HYDROcodone-acetaminophen (NORCO/VICODIN) 5-325 MG tablet Take 1 tablet by mouth every 6 (six) hours as needed for moderate pain. 03/24/18   Milton Ferguson, MD  metFORMIN (GLUCOPHAGE-XR) 500 MG 24 hr tablet 1 tab by mouth daily with dinner 02/08/16   Mack Hook, MD  methocarbamol (ROBAXIN) 500 MG tablet Take 1 tablet (500 mg total) by mouth every 8 (eight) hours as needed for muscle spasms. 09/23/17   Petrucelli, Samantha R, PA-C  naproxen (NAPROSYN) 500 MG tablet Take 1 tablet (500 mg total) by mouth 2 (two) times daily as needed (pain). 09/23/17   Petrucelli, Samantha R, PA-C  terbinafine (LAMISIL) 250 MG tablet Take 1 tablet (250 mg total) by mouth daily. 02/08/16   Mack Hook, MD  traMADol (ULTRAM) 50 MG tablet Take 1 tablet (50 mg  total) by mouth every 6 (six) hours as needed. 08/05/18   Orvan July, NP    Family History Family History  Problem Relation Age of Onset  . Arthritis Sister   . Arthritis Brother   . Prostate cancer Brother     Social History Social History   Tobacco Use  . Smoking status: Never Smoker  . Smokeless tobacco: Never Used  Substance Use Topics  . Alcohol use: No    Alcohol/week: 0.0 standard drinks  . Drug use: No     Allergies   Patient has no known allergies.   Review of Systems Review of Systems  Cardiovascular: Positive for palpitations.  All  other systems reviewed and are negative.    Physical Exam Updated Vital Signs BP 120/80   Pulse 86   Resp 19   Ht '6\' 2"'$  (1.88 m)   Wt 94.8 kg   SpO2 95%   BMI 26.83 kg/m   Physical Exam Vitals signs and nursing note reviewed.  Constitutional:      General: He is not in acute distress.    Appearance: He is well-developed. He is not diaphoretic.  HENT:     Head: Normocephalic and atraumatic.  Neck:     Musculoskeletal: Normal range of motion and neck supple.  Cardiovascular:     Rate and Rhythm: Normal rate and regular rhythm.     Heart sounds: No murmur. No friction rub.  Pulmonary:     Effort: Pulmonary effort is normal. No respiratory distress.     Breath sounds: Normal breath sounds. No wheezing or rales.  Abdominal:     General: Bowel sounds are normal. There is no distension.     Palpations: Abdomen is soft.     Tenderness: There is no abdominal tenderness.  Musculoskeletal: Normal range of motion.     Comments: Bilateral thumbs and knees are grossly normal in appearance.  He has good range of motion.  There is no joint effusion, redness, or warmth.  Skin:    General: Skin is warm and dry.  Neurological:     Mental Status: He is alert and oriented to person, place, and time.     Coordination: Coordination normal.      ED Treatments / Results  Labs (all labs ordered are listed, but only abnormal results are displayed) Labs Reviewed  BASIC METABOLIC PANEL  CBC WITH DIFFERENTIAL/PLATELET    EKG EKG Interpretation  Date/Time:  Saturday August 11 2018 12:27:08 EST Ventricular Rate:  84 PR Interval:    QRS Duration: 91 QT Interval:  354 QTC Calculation: 419 R Axis:   44 Text Interpretation:  Sinus rhythm Probable left atrial enlargement Low voltage, precordial leads Borderline T wave abnormalities Confirmed by Veryl Speak [phone]) on 08/11/2018 12:43:05 PM   Radiology No results found.  Procedures Procedures (including critical care  time)  Medications Ordered in ED Medications - No data to display   Initial Impression / Assessment and Plan / ED Course  I have reviewed the triage vital signs and the nursing notes.  Pertinent labs & imaging results that were available during my care of the patient were reviewed by me and considered in my medical decision making (see chart for details).  Patient presents here with complaints of palpitations and nausea that started this morning upon waking.  His EKG shows a normal sinus rhythm with normal rate and no changes from prior EKGs.  He has maintained a sinus rhythm throughout his ED course.  He has  not experienced any discomfort and laboratory studies are all unremarkable.  I see no indication for admission.  I believe he is okay for discharge.  He also complains of pain in his knees and thumbs which seems like a chronic issue.  I have found prior visits with similar complaints on past ED visits.  Since he is having this joint pain, I feel as though some sort of rheumatologic work-up is in order.  He will be given the follow-up information for rheumatology.  He is to return here as needed for any problems.  Final Clinical Impressions(s) / ED Diagnoses   Final diagnoses:  None    ED Discharge Orders    None       Veryl Speak, MD 08/11/18 1441

## 2018-08-13 ENCOUNTER — Ambulatory Visit (INDEPENDENT_AMBULATORY_CARE_PROVIDER_SITE_OTHER): Payer: Medicaid Other | Admitting: Family Medicine

## 2018-08-13 ENCOUNTER — Encounter (INDEPENDENT_AMBULATORY_CARE_PROVIDER_SITE_OTHER): Payer: Self-pay | Admitting: Family Medicine

## 2018-08-13 DIAGNOSIS — M25562 Pain in left knee: Secondary | ICD-10-CM | POA: Diagnosis not present

## 2018-08-13 DIAGNOSIS — M25561 Pain in right knee: Secondary | ICD-10-CM | POA: Diagnosis not present

## 2018-08-13 DIAGNOSIS — G8929 Other chronic pain: Secondary | ICD-10-CM | POA: Diagnosis not present

## 2018-08-13 MED ORDER — ETODOLAC 400 MG PO TABS: 400.0000 mg | ORAL_TABLET | Freq: Two times a day (BID) | ORAL | 3 refills | Status: DC | PRN

## 2018-08-13 NOTE — Patient Instructions (Signed)
   Knees:  Naproxen or Etodolac as needed (not both at once).  Glucosamine Sulfate 1,000 mg twice daily (available over the counter).  Vitamin D3:  Take 5,000 IU daily long-term.  Knee exercises to keep muscles strong (also helps decrease pain).  If pain worsens, could consider more injections, or possibly discuss knee replacement with surgeon.

## 2018-08-13 NOTE — Progress Notes (Signed)
Office Visit Note   Patient: Samuel [name]           Date of Birth: 1956/04/07           MRN: [id number] Visit Date: 08/13/2018 Requested by: [facility] [address] [place], [place] [address] PCP: [facility]  Subjective: Chief Complaint  Patient presents with  . Right Knee - Pain  . Left Knee - Pain    HPI: He is a 63 year old with bilateral knee pain.  Symptoms started about 20 years ago while [place].  He went to a doctor who gave him some medication and told him that he would probably be back in 10 years.  About 10 years later his knee started hurting again and he was living in the [place].  He went to a doctor and was given some medications which helped calm down his pain.  He was told he had some arthritis at that point.  He did well until a couple years ago when he started having intermittent pain again.  He went to another orthopedic clinic and was given injections which only gave temporary relief.  He states that he went to physical therapy as well.  He has continued to have pain and recently went to another clinic where he was given naproxen which has helped his thumb pain but not his knees.  Both of his knees ache when he tries to walk.  Pain does not seem to keep him awake at night.  He has diabetes which has been treated with medication, fairly well controlled.  He has a history of vitamin D deficiency which is not currently being treated.  No definite family history of rheumatologic disease.              ROS: All other systems were reviewed and are negative as pertains to this problem.  Objective: Vital Signs: There were no vitals taken for this visit.  Physical Exam:  Knees: 1+ effusion in both knees, 2+ patellofemoral crepitus bilaterally.  Mild pain with patella compression in both knees.  Range of motion full extension to 130 degrees flexion.  No significant joint line tenderness, no pain or click with McMurray's  bilaterally.  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  Assessment & Plan: 1.  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  We will try a different anti-inflammatory, glucosamine, we will treat his vitamin D deficiency and do straight leg raises for strengthening.  Could contemplate Visco supplementation or surgical consult if symptoms persist.   Follow-Up Instructions: No follow-ups on file.      Procedures: No procedures performed  No notes on file    PMFS History: Patient Active Problem List   Diagnosis Date Noted  . Unspecified vitamin D deficiency 03/20/2014  . Dental cavities 03/20/2014  . IFG (impaired fasting glucose) 02/26/2014  . Multinodular goiter 02/26/2014  . Goiter    Past Medical History:  Diagnosis Date  . Arthritis    Knees  . Diabetes mellitus without complication ([place])   . Goiter    Multinodular Goiter with partial thyroidectomey in [place]  1998.  Ultrasound confirming diagnosis in 01/17/2014    Family History  Problem Relation Age of Onset  . Arthritis Sister   . Arthritis Brother   . Prostate cancer Brother     Past  Surgical History:  Procedure Laterality Date  . APPENDECTOMY  2014  . THYROID SURGERY  1998   Enlarged and painful thyroid   Social History   Occupational History  . Occupation: unemployed currently  Tobacco Use  . Smoking status: Never Smoker  . Smokeless tobacco: Never Used  Substance and Sexual Activity  . Alcohol use: No    Alcohol/week: 0.0 standard drinks  . Drug use: No  . Sexual activity: Not on file

## 2018-08-13 NOTE — Progress Notes (Signed)
Chronic pain bilateral knees, left greater than right - intermittent pain since 1996, NKI. No improvement with Meloxicam, Celebrex nor Naproxen.

## 2018-09-09 ENCOUNTER — Emergency Department (HOSPITAL_COMMUNITY)
Admission: EM | Admit: 2018-09-09 | Discharge: 2018-09-09 | Disposition: A | Discharge status: Home / Self Care | Payer: Medicaid Other | Attending: Emergency Medicine | Admitting: Emergency Medicine

## 2018-09-09 ENCOUNTER — Other Ambulatory Visit: Payer: Self-pay

## 2018-09-09 ENCOUNTER — Encounter (HOSPITAL_COMMUNITY): Payer: Self-pay | Admitting: *Deleted

## 2018-09-09 ENCOUNTER — Emergency Department (HOSPITAL_COMMUNITY): Payer: Medicaid Other

## 2018-09-09 DIAGNOSIS — Z7984 Long term (current) use of oral hypoglycemic drugs: Secondary | ICD-10-CM | POA: Diagnosis not present

## 2018-09-09 DIAGNOSIS — R079 Chest pain, unspecified: Secondary | ICD-10-CM

## 2018-09-09 DIAGNOSIS — M2550 Pain in unspecified joint: Secondary | ICD-10-CM | POA: Diagnosis not present

## 2018-09-09 DIAGNOSIS — E119 Type 2 diabetes mellitus without complications: Secondary | ICD-10-CM | POA: Diagnosis not present

## 2018-09-09 DIAGNOSIS — R1013 Epigastric pain: Secondary | ICD-10-CM | POA: Diagnosis not present

## 2018-09-09 LAB — CBC WITH DIFFERENTIAL/PLATELET
ABS IMMATURE GRANULOCYTES: 0.01 10*3/uL (ref 0.00–0.07)
Basophils Absolute: 0 10*3/uL (ref 0.0–0.1)
Basophils Relative: 0 %
Eosinophils Absolute: 0.2 10*3/uL (ref 0.0–0.5)
Eosinophils Relative: 3 %
HCT: 44.4 % (ref 39.0–52.0)
HEMOGLOBIN: 13.7 g/dL (ref 13.0–17.0)
IMMATURE GRANULOCYTES: 0 %
LYMPHS PCT: 38 %
Lymphs Abs: 1.9 10*3/uL (ref 0.7–4.0)
MCH: 26.6 pg (ref 26.0–34.0)
MCHC: 30.9 g/dL (ref 30.0–36.0)
MCV: 86.2 fL (ref 80.0–100.0)
MONO ABS: 0.8 10*3/uL (ref 0.1–1.0)
MONOS PCT: 15 %
NEUTROS ABS: 2.1 10*3/uL (ref 1.7–7.7)
NEUTROS PCT: 44 %
PLATELETS: 255 10*3/uL (ref 150–400)
RBC: 5.15 MIL/uL (ref 4.22–5.81)
RDW: 13.9 % (ref 11.5–15.5)
WBC: 5 10*3/uL (ref 4.0–10.5)
nRBC: 0 % (ref 0.0–0.2)

## 2018-09-09 LAB — URINALYSIS, ROUTINE W REFLEX MICROSCOPIC
Bilirubin Urine: NEGATIVE
GLUCOSE, UA: NEGATIVE mg/dL
Hgb urine dipstick: NEGATIVE
KETONES UR: NEGATIVE mg/dL
LEUKOCYTE UA: NEGATIVE
NITRITE: NEGATIVE
PH: 5 (ref 5.0–8.0)
Protein, ur: NEGATIVE mg/dL
SPECIFIC GRAVITY, URINE: 1.018 (ref 1.005–1.030)

## 2018-09-09 LAB — COMPREHENSIVE METABOLIC PANEL
ALK PHOS: 49 U/L (ref 38–126)
ALT: 18 U/L (ref 0–44)
AST: 26 U/L (ref 15–41)
Albumin: 3.3 g/dL — ABNORMAL LOW (ref 3.5–5.0)
Anion gap: 7 (ref 5–15)
BUN: 13 mg/dL (ref 8–23)
CALCIUM: 8.9 mg/dL (ref 8.9–10.3)
CHLORIDE: 105 mmol/L (ref 98–111)
CO2: 25 mmol/L (ref 22–32)
CREATININE: 0.77 mg/dL (ref 0.61–1.24)
Glucose, Bld: 117 mg/dL — ABNORMAL HIGH (ref 70–99)
Potassium: 5 mmol/L (ref 3.5–5.1)
Sodium: 137 mmol/L (ref 135–145)
Total Bilirubin: 0.6 mg/dL (ref 0.3–1.2)
Total Protein: 7 g/dL (ref 6.5–8.1)

## 2018-09-09 LAB — LIPASE, BLOOD: Lipase: 22 U/L (ref 11–51)

## 2018-09-09 LAB — I-STAT TROPONIN, ED: TROPONIN I, POC: 0 ng/mL (ref 0.00–0.08)

## 2018-09-09 MED ORDER — FAMOTIDINE 20 MG PO TABS: 20.0000 mg | ORAL_TABLET | Freq: Two times a day (BID) | ORAL | 0 refills | Status: DC

## 2018-09-09 MED ORDER — CELECOXIB 100 MG PO CAPS: 100.0000 mg | ORAL_CAPSULE | Freq: Two times a day (BID) | ORAL | 0 refills | Status: DC

## 2018-09-09 MED ORDER — ALUM & MAG HYDROXIDE-SIMETH 200-200-20 MG/5ML PO SUSP
30.0000 mL | Freq: Once | ORAL | Status: AC
  Administered 2018-09-09: 30 mL via ORAL
  Filled 2018-09-09: qty 30

## 2018-09-09 MED ORDER — LIDOCAINE VISCOUS HCL 2 % MT SOLN
15.0000 mL | Freq: Once | OROMUCOSAL | Status: AC
  Administered 2018-09-09: 15 mL via ORAL
  Filled 2018-09-09: qty 15

## 2018-09-09 NOTE — Discharge Instructions (Addendum)
Please do not take ibuprofen, motrin, advil, aleve, naproxen or any other pain medicaitons with the meidicne I have prescribed. See your primary care doctor in the next 5-7 days. Your caregiver has diagnosed you as having chest pain that is not specific for one problem, but does not require admission.  You are at low risk for an acute heart condition or other serious illness. Chest pain comes from many different causes.  SEEK IMMEDIATE MEDICAL ATTENTION IF: You have severe chest pain, especially if the pain is crushing or pressure-like and spreads to the arms, back, neck, or jaw, or if you have sweating, nausea (feeling sick to your stomach), or shortness of breath. THIS IS AN EMERGENCY. Don't wait to see if the pain will go away. Get medical help at once. Call 911 or 0 (operator). DO NOT drive yourself to the hospital.  Your chest pain gets worse and does not go away with rest.  You have an attack of chest pain lasting longer than usual, despite rest and treatment with the medications your caregiver has prescribed.  You wake from sleep with chest pain or shortness of breath.  You feel dizzy or faint.  You have chest pain not typical of your usual pain for which you originally saw your caregiver.

## 2018-09-09 NOTE — ED Triage Notes (Signed)
Pt c/o joint pain for several months, has been having increased pain tonight. Also c/o low back pain and arm weakness which he has been seen for before

## 2018-09-09 NOTE — ED Notes (Signed)
Patient transported to X-ray 

## 2018-09-09 NOTE — ED Notes (Signed)
Delay in lab draw,  Pt in bathroom. 

## 2018-09-09 NOTE — ED Provider Notes (Signed)
[facility] EMERGENCY DEPARTMENT Provider Note   CSN: [id number] Arrival date & time: 09/09/18  0353     History   Chief Complaint Chief Complaint  Patient presents with  . Joint Pain    HPI Samuel [name] is a 63 y.o. male with a hx of arthritis, non-insulin-dependent diabetes presents to the Emergency Department complaining of gradual, persistent, progressively worsening bilateral knee and thumb pain onset many months ago.  Patient reports he has been seen many times here in the emergency department.  He has been given naproxen, Vicodin and tramadol without relief of his pain.  He reports nothing seems to make his symptoms better or worse.  Patient was referred to rheumatology however it does not appear that he is followed up with them.  Patient reports he did see his regular doctor but medications that he has been given are not helping.  No specific aggravating or alleviating factors.  No joint swelling, fevers or chills.  Patient denies IV drug use or penile discharge.  He reports no history of gout.  Patient also states he is not a diabetic and therefore does not take medications for this.  Nursing note reports right arm weakness.  Patient states this is true however it has been persistent for a very long time.  Patient additionally is complaining of 2 days of constant chest pain and epigastric abdominal pain.  He reports some nausea with this but denies vomiting or diarrhea.  No weakness, dizziness, syncope, fever, chills, cough, diaphoresis, pain or shortness of breath with exertion.  Patient denies a history of disease or family history of early cardiac death.  He has never seen a cardiologist.  No specific aggravating or alleviating factors for this.  The history is provided by the patient and medical records. No language interpreter was used.    Past Medical History:  Diagnosis Date  . Arthritis    Knees  . Diabetes mellitus without complication ([place])   . Goiter     Multinodular Goiter with partial thyroidectomey in [place]  1998.  Ultrasound confirming diagnosis in 01/17/2014    Patient Active Problem List   Diagnosis Date Noted  . Unspecified vitamin D deficiency 03/20/2014  . Dental cavities 03/20/2014  . IFG (impaired fasting glucose) 02/26/2014  . Multinodular goiter 02/26/2014  . Goiter     Past Surgical History:  Procedure Laterality Date  . APPENDECTOMY  2014  . THYROID SURGERY  1998   Enlarged and painful thyroid        Home Medications    Prior to Admission medications   Medication Sig Start Date End Date Taking? Authorizing Provider  blood glucose meter kit and supplies KIT Check sugars twice daily, in morning when fasting and in evening before dinner Patient not taking: Reported on 10/31/2017 02/08/16   Mack Hook, MD  etodolac (LODINE) 400 MG tablet Take 1 tablet (400 mg total) by mouth 2 (two) times daily as needed. 08/13/18   Hilts, Legrand Como, MD  glucose blood test strip Use as instructed Patient not taking: Reported on 10/31/2017 02/08/16   Mack Hook, MD  metFORMIN (GLUCOPHAGE-XR) 500 MG 24 hr tablet 1 tab by mouth daily with dinner Patient not taking: Reported on 08/13/2018 02/08/16   Mack Hook, MD  naproxen (NAPROSYN) 500 MG tablet Take 1 tablet (500 mg total) by mouth 2 (two) times daily with a meal. 08/11/18   Veryl Speak, MD  TAMIFLU 75 MG capsule TK 1 C PO BID 08/07/18   [provider]  traMADol (ULTRAM) 50 MG tablet Take 1 tablet (50 mg total) by mouth every 6 (six) hours as needed. 08/05/18   Orvan July, NP    Family History Family History  Problem Relation Age of Onset  . Arthritis Sister   . Arthritis Brother   . Prostate cancer Brother     Social History Social History   Tobacco Use  . Smoking status: Never Smoker  . Smokeless tobacco: Never Used  Substance Use Topics  . Alcohol use: No    Alcohol/week: 0.0 standard drinks  . Drug use: No     Allergies     Patient has no known allergies.   Review of Systems Review of Systems  Constitutional: Negative for appetite change, diaphoresis, fatigue, fever and unexpected weight change.  HENT: Negative for mouth sores.   Eyes: Negative for visual disturbance.  Respiratory: Negative for cough, chest tightness, shortness of breath and wheezing.   Cardiovascular: Positive for chest pain.  Gastrointestinal: Positive for nausea. Negative for abdominal pain, constipation, diarrhea and vomiting.  Endocrine: Negative for polydipsia, polyphagia and polyuria.  Genitourinary: Negative for dysuria, frequency, hematuria and urgency.  Musculoskeletal: Positive for arthralgias. Negative for back pain and neck stiffness.  Skin: Negative for rash.  Allergic/Immunologic: Negative for immunocompromised state.  Neurological: Negative for syncope, light-headedness and headaches.  Hematological: Does not bruise/bleed easily.  Psychiatric/Behavioral: Negative for sleep disturbance. The patient is not nervous/anxious.      Physical Exam Updated Vital Signs BP 133/74 (BP Location: Right Arm)   Pulse 62   Temp 98.4 F (36.9 C) (Oral)   Resp 15   Ht '6\' 2"'$  (1.88 m)   Wt 99.8 kg   SpO2 97%   BMI 28.25 kg/m   Physical Exam Vitals signs and nursing note reviewed.  Constitutional:      General: He is not in acute distress.    Appearance: He is well-developed. He is not diaphoretic.     Comments: Awake, alert, nontoxic appearance  HENT:     Head: Normocephalic and atraumatic.     Mouth/Throat:     Pharynx: No oropharyngeal exudate.  Eyes:     General: No scleral icterus.    Conjunctiva/sclera: Conjunctivae normal.  Neck:     Musculoskeletal: Normal range of motion and neck supple.  Cardiovascular:     Rate and Rhythm: Normal rate and regular rhythm.     Comments: Chest is nontender yet Pulmonary:     Effort: Pulmonary effort is normal. No respiratory distress.     Breath sounds: Normal breath sounds. No  wheezing.  Abdominal:     General: Bowel sounds are normal.     Palpations: Abdomen is soft. There is no mass.     Tenderness: There is no abdominal tenderness. There is no guarding or rebound.     Comments: Abdomen is soft and nontender.  Musculoskeletal: Normal range of motion.     Comments: Range of motion of all major joints.  No joint swelling, erythema or increased warmth to any of the joints.  Skin:    General: Skin is warm and dry.  Neurological:     Mental Status: He is alert.     Comments: Speech is clear and goal oriented Moves extremities without ataxia Sensation intact to normal touch in the upper and lower extremities. Strength 5/5 in the bilateral upper and lower extremities including grip strength and dorsiflexion, plantarflexion. Normal gait and balance      ED Treatments /  Results   Procedures Procedures (including critical care time)  Medications Ordered in ED Medications  alum & mag hydroxide-simeth (MAALOX/MYLANTA) 200-200-20 MG/5ML suspension 30 mL (30 mLs Oral Given 09/09/18 0715)    And  lidocaine (XYLOCAINE) 2 % viscous mouth solution 15 mL (15 mLs Oral Given 09/09/18 0715)     Initial Impression / Assessment and Plan / ED Course  I have reviewed the triage vital signs and the nursing notes.  Pertinent labs & imaging results that were available during my care of the patient were reviewed by me and considered in my medical decision making (see chart for details).      Presents with multiple complaints.  Joint pain is been longstanding and ongoing.  There is no evidence of septic joint today.  Patient will need to follow-up with orthopedics and rheumatology for this.  Will not refill any narcotics.  Patient also with chest pain and epigastric abdominal pain.  I suspect this is stridorous from the large volume of NSAIDs patient has been taking for his joint pain.  However he does have a history of diabetes and is noncompliant with his medications.   Labs, EKG and chest x-ray are pending.  At shift change care was transferred to Doreen Salvage, PA-C who will follow pending studies, re-evaulate and determine disposition.     Final Clinical Impressions(s) / ED Diagnoses   Final diagnoses:  Central chest pain  Epigastric abdominal pain  Arthralgia, unspecified joint    ED Discharge Orders    None       Loni Muse Gwenlyn Perking 09/09/18 1655    Varney Biles, MD 09/09/18 2303

## 2018-11-10 ENCOUNTER — Other Ambulatory Visit: Payer: Self-pay

## 2018-11-10 ENCOUNTER — Encounter (HOSPITAL_COMMUNITY): Payer: Self-pay | Admitting: Emergency Medicine

## 2018-11-10 ENCOUNTER — Emergency Department (HOSPITAL_COMMUNITY)
Admission: EM | Admit: 2018-11-10 | Discharge: 2018-11-10 | Disposition: A | Discharge status: Home / Self Care | Payer: Medicaid Other | Attending: Emergency Medicine | Admitting: Emergency Medicine

## 2018-11-10 ENCOUNTER — Emergency Department (HOSPITAL_COMMUNITY): Payer: Medicaid Other

## 2018-11-10 DIAGNOSIS — R112 Nausea with vomiting, unspecified: Secondary | ICD-10-CM | POA: Insufficient documentation

## 2018-11-10 DIAGNOSIS — B349 Viral infection, unspecified: Secondary | ICD-10-CM | POA: Insufficient documentation

## 2018-11-10 DIAGNOSIS — Z79899 Other long term (current) drug therapy: Secondary | ICD-10-CM | POA: Insufficient documentation

## 2018-11-10 DIAGNOSIS — E119 Type 2 diabetes mellitus without complications: Secondary | ICD-10-CM | POA: Diagnosis not present

## 2018-11-10 LAB — CBC WITH DIFFERENTIAL/PLATELET
Abs Immature Granulocytes: 0.01 10*3/uL (ref 0.00–0.07)
Basophils Absolute: 0 10*3/uL (ref 0.0–0.1)
Basophils Relative: 0 %
Eosinophils Absolute: 0 10*3/uL (ref 0.0–0.5)
Eosinophils Relative: 0 %
HCT: 45.8 % (ref 39.0–52.0)
Hemoglobin: 14.1 g/dL (ref 13.0–17.0)
Immature Granulocytes: 0 %
Lymphocytes Relative: 28 %
Lymphs Abs: 1.3 10*3/uL (ref 0.7–4.0)
MCH: 26 pg (ref 26.0–34.0)
MCHC: 30.8 g/dL (ref 30.0–36.0)
MCV: 84.5 fL (ref 80.0–100.0)
Monocytes Absolute: 0.6 10*3/uL (ref 0.1–1.0)
Monocytes Relative: 13 %
Neutro Abs: 2.8 10*3/uL (ref 1.7–7.7)
Neutrophils Relative %: 59 %
Platelets: 204 10*3/uL (ref 150–400)
RBC: 5.42 MIL/uL (ref 4.22–5.81)
RDW: 13.7 % (ref 11.5–15.5)
WBC: 4.7 10*3/uL (ref 4.0–10.5)
nRBC: 0 % (ref 0.0–0.2)

## 2018-11-10 LAB — COMPREHENSIVE METABOLIC PANEL
ALT: 37 U/L (ref 0–44)
AST: 33 U/L (ref 15–41)
Albumin: 3.4 g/dL — ABNORMAL LOW (ref 3.5–5.0)
Alkaline Phosphatase: 67 U/L (ref 38–126)
Anion gap: 12 (ref 5–15)
BUN: 12 mg/dL (ref 8–23)
CO2: 25 mmol/L (ref 22–32)
Calcium: 8.8 mg/dL — ABNORMAL LOW (ref 8.9–10.3)
Chloride: 97 mmol/L — ABNORMAL LOW (ref 98–111)
Creatinine, Ser: 0.97 mg/dL (ref 0.61–1.24)
GFR calc Af Amer: >60 mL/min (ref >60)
GFR calc non Af Amer: >60 mL/min (ref >60)
Glucose, Bld: 205 mg/dL — ABNORMAL HIGH (ref 70–99)
Potassium: 3.6 mmol/L (ref 3.5–5.1)
Sodium: 134 mmol/L — ABNORMAL LOW (ref 135–145)
Total Bilirubin: 0.9 mg/dL (ref 0.3–1.2)
Total Protein: 7.2 g/dL (ref 6.5–8.1)

## 2018-11-10 LAB — LIPASE, BLOOD: Lipase: 24 U/L (ref 11–51)

## 2018-11-10 MED ORDER — ONDANSETRON HCL 4 MG/2ML IJ SOLN
4.0000 mg | Freq: Once | INTRAMUSCULAR | Status: AC
  Administered 2018-11-10: 4 mg via INTRAVENOUS
  Filled 2018-11-10: qty 2

## 2018-11-10 MED ORDER — ONDANSETRON 4 MG PO TBDP: 4.0000 mg | ORAL_TABLET | Freq: Three times a day (TID) | ORAL | 0 refills | Status: DC | PRN

## 2018-11-10 MED ORDER — SODIUM CHLORIDE 0.9 % IV BOLUS
1000.0000 mL | Freq: Once | INTRAVENOUS | Status: AC
  Administered 2018-11-10: 1000 mL via INTRAVENOUS

## 2018-11-10 NOTE — ED Notes (Signed)
Patient tolerated PO challenge and ate crackers without incident.

## 2018-11-10 NOTE — ED Triage Notes (Signed)
Onset 4 days ago developed body achy, chills, and today vomiting 3 times yellow in color with general abdominal pain.

## 2018-11-10 NOTE — ED Provider Notes (Signed)
[name] EMERGENCY DEPARTMENT Provider Note   CSN: [id number] Arrival date & time: 11/10/18  7353    History   Chief Complaint Chief Complaint  Patient presents with  . Fever  . Emesis  . Nausea  . Headache  . Abdominal Pain    HPI Samuel [name] is a 63 y.o. male.     HPI Patient presents with chills aches vomiting and abdominal pain.  Began 3 to 4 days ago.  Pain is dull in the mid abdomen.  States temperatures up to 99.  Decreased appetite.  No diarrhea states been having normal bowel movements.  Occasional cough with some sputum production.  No definite sick contacts.  States this feels like when he had malaria 20 years ago. Past Medical History:  Diagnosis Date  . Arthritis    Knees  . Diabetes mellitus without complication ([place])   . Goiter    Multinodular Goiter with partial thyroidectomey in [place]  1998.  Ultrasound confirming diagnosis in 01/17/2014    Patient Active Problem List   Diagnosis Date Noted  . Unspecified vitamin D deficiency 03/20/2014  . Dental cavities 03/20/2014  . IFG (impaired fasting glucose) 02/26/2014  . Multinodular goiter 02/26/2014  . Goiter     Past Surgical History:  Procedure Laterality Date  . APPENDECTOMY  2014  . THYROID SURGERY  1998   Enlarged and painful thyroid        Home Medications    Prior to Admission medications   Medication Sig Start Date End Date Taking? Authorizing Provider  blood glucose meter kit and supplies KIT Check sugars twice daily, in morning when fasting and in evening before dinner Patient not taking: Reported on 10/31/2017 02/08/16   Mack Hook, MD  celecoxib (CELEBREX) 100 MG capsule Take 1 capsule (100 mg total) by mouth 2 (two) times daily. 09/09/18   Margarita Mail, PA-C  etodolac (LODINE) 400 MG tablet Take 1 tablet (400 mg total) by mouth 2 (two) times daily as needed. 08/13/18   Hilts, Legrand Como, MD  famotidine (PEPCID) 20 MG tablet Take 1 tablet (20 mg total) by  mouth 2 (two) times daily. 09/09/18   Margarita Mail, PA-C  glucose blood test strip Use as instructed Patient not taking: Reported on 10/31/2017 02/08/16   Mack Hook, MD  metFORMIN (GLUCOPHAGE-XR) 500 MG 24 hr tablet 1 tab by mouth daily with dinner Patient not taking: Reported on 08/13/2018 02/08/16   Mack Hook, MD  ondansetron (ZOFRAN-ODT) 4 MG disintegrating tablet Take 1 tablet (4 mg total) by mouth every 8 (eight) hours as needed for nausea or vomiting. 11/10/18   Davonna Belling, MD  TAMIFLU 75 MG capsule TK 1 C PO BID 08/07/18   [provider]  traMADol (ULTRAM) 50 MG tablet Take 1 tablet (50 mg total) by mouth every 6 (six) hours as needed. 08/05/18   Orvan July, NP    Family History Family History  Problem Relation Age of Onset  . Arthritis Sister   . Arthritis Brother   . Prostate cancer Brother     Social History Social History   Tobacco Use  . Smoking status: Never Smoker  . Smokeless tobacco: Never Used  Substance Use Topics  . Alcohol use: No    Alcohol/week: 0.0 standard drinks  . Drug use: No     Allergies   Patient has no known allergies.   Review of Systems Review of Systems  Constitutional: Positive for appetite change, chills and fever.  HENT:  Negative for congestion.   Respiratory: Positive for cough. Negative for shortness of breath.   Gastrointestinal: Positive for abdominal pain, nausea and vomiting.  Genitourinary: Negative for flank pain.  Musculoskeletal: Positive for myalgias. Negative for back pain.  Skin: Negative for pallor.  Neurological: Negative for weakness.  Psychiatric/Behavioral: Negative for confusion.     Physical Exam Updated Vital Signs BP 117/76   Pulse 91   Temp 99.1 F (37.3 C) (Oral)   Resp (!) 21   Ht 6' (1.829 m)   Wt 99.8 kg   SpO2 96%   BMI 29.84 kg/m   Physical Exam Vitals signs and nursing note reviewed.  HENT:     Head: Normocephalic.  Eyes:     General: No scleral  icterus. Cardiovascular:     Rate and Rhythm: Regular rhythm.  Pulmonary:     Effort: No respiratory distress.  Abdominal:     Tenderness: There is no abdominal tenderness.  Musculoskeletal:        General: No swelling.  Lymphadenopathy:     Cervical: No cervical adenopathy.  Skin:    General: Skin is warm.  Neurological:     Mental Status: He is alert.  Psychiatric:        Mood and Affect: Mood normal.      ED Treatments / Results  Labs (all labs ordered are listed, but only abnormal results are displayed) Labs Reviewed  COMPREHENSIVE METABOLIC PANEL - Abnormal; Notable for the following components:      Result Value   Sodium 134 (*)    Chloride 97 (*)    Glucose, Bld 205 (*)    Calcium 8.8 (*)    Albumin 3.4 (*)    All other components within normal limits  LIPASE, BLOOD  CBC WITH DIFFERENTIAL/PLATELET    EKG None  Radiology Dg Chest Portable 1 View  Result Date: 11/10/2018 CLINICAL DATA:  Body aches and chills. EXAM: PORTABLE CHEST 1 VIEW COMPARISON:  September 09, 2018 FINDINGS: Mild opacity persists in the left base. The cardiomediastinal silhouette is stable. No pneumothorax. No other acute abnormalities. IMPRESSION: Mild persistent opacity in left base may simply represent atelectasis or scar. Mild infiltrate not excluded. Recommend short-term follow-up to ensure resolution. Electronically Signed   By: Dorise Bullion III M.D   On: 11/10/2018 09:24    Procedures Procedures (including critical care time)  Medications Ordered in ED Medications  sodium chloride 0.9 % bolus 1,000 mL (0 mLs Intravenous Stopped 11/10/18 1135)  ondansetron (ZOFRAN) injection 4 mg (4 mg Intravenous Given 11/10/18 0926)     Initial Impression / Assessment and Plan / ED Course  I have reviewed the triage vital signs and the nursing notes.  Pertinent labs & imaging results that were available during my care of the patient were reviewed by me and considered in my medical decision  making (see chart for details).        Patient with nausea and vomiting.  Cough.  Dull abdominal pain.  Labs reassuring.  I think is likely viral cause.  Patient was worried about malaria but has no anemia LFTs are normal.  Last infection with malaria was 20 years ago.  Feels somewhat better and will discharge home.  Discussed with patient that we cannot rule out COVID infection.  Final Clinical Impressions(s) / ED Diagnoses   Final diagnoses:  Non-intractable vomiting with nausea, unspecified vomiting type  Viral infection    ED Discharge Orders         Ordered  ondansetron (ZOFRAN-ODT) 4 MG disintegrating tablet  Every 8 hours PRN     11/10/18 1114           Davonna Belling, MD 11/10/18 1557

## 2018-11-10 NOTE — ED Notes (Signed)
Gave patient water and crackers for challenge.

## 2018-11-10 NOTE — Discharge Instructions (Signed)
You likely have a viral infection.  Try and keep yourself hydrated.  Take the medicine to help control the nausea.  Keep yourself isolated till symptoms have resolved for at least 3 days.

## 2018-11-10 NOTE — ED Notes (Signed)
Patient verbalizes understanding of discharge instructions. Opportunity for questioning and answers were provided. Armband removed by staff, pt discharged from ED.  

## 2018-11-13 ENCOUNTER — Other Ambulatory Visit: Payer: Self-pay

## 2018-11-13 ENCOUNTER — Emergency Department (HOSPITAL_COMMUNITY): Payer: Medicaid Other

## 2018-11-13 ENCOUNTER — Inpatient Hospital Stay (HOSPITAL_COMMUNITY)
Admission: EM | Admit: 2018-11-13 | Discharge: 2018-11-16 | DRG: 177 | Disposition: A | Discharge status: Home / Self Care | Payer: Medicaid Other | Attending: Internal Medicine | Admitting: Internal Medicine

## 2018-11-13 DIAGNOSIS — J9811 Atelectasis: Secondary | ICD-10-CM | POA: Diagnosis present

## 2018-11-13 DIAGNOSIS — J9621 Acute and chronic respiratory failure with hypoxia: Secondary | ICD-10-CM | POA: Diagnosis present

## 2018-11-13 DIAGNOSIS — Z79899 Other long term (current) drug therapy: Secondary | ICD-10-CM

## 2018-11-13 DIAGNOSIS — Z792 Long term (current) use of antibiotics: Secondary | ICD-10-CM

## 2018-11-13 DIAGNOSIS — E042 Nontoxic multinodular goiter: Secondary | ICD-10-CM | POA: Diagnosis present

## 2018-11-13 DIAGNOSIS — J1289 Other viral pneumonia: Secondary | ICD-10-CM | POA: Diagnosis present

## 2018-11-13 DIAGNOSIS — Z7984 Long term (current) use of oral hypoglycemic drugs: Secondary | ICD-10-CM

## 2018-11-13 DIAGNOSIS — E119 Type 2 diabetes mellitus without complications: Secondary | ICD-10-CM

## 2018-11-13 DIAGNOSIS — Z79891 Long term (current) use of opiate analgesic: Secondary | ICD-10-CM | POA: Diagnosis not present

## 2018-11-13 DIAGNOSIS — Z8261 Family history of arthritis: Secondary | ICD-10-CM | POA: Diagnosis not present

## 2018-11-13 DIAGNOSIS — Z8042 Family history of malignant neoplasm of prostate: Secondary | ICD-10-CM | POA: Diagnosis not present

## 2018-11-13 DIAGNOSIS — R112 Nausea with vomiting, unspecified: Secondary | ICD-10-CM | POA: Diagnosis present

## 2018-11-13 LAB — URINALYSIS, ROUTINE W REFLEX MICROSCOPIC
Bacteria, UA: NONE SEEN
Bilirubin Urine: NEGATIVE
Glucose, UA: NEGATIVE mg/dL
Ketones, ur: NEGATIVE mg/dL
Leukocytes,Ua: NEGATIVE
Nitrite: NEGATIVE
Protein, ur: 100 mg/dL — AB
Specific Gravity, Urine: 1.03 (ref 1.005–1.030)
pH: 5 (ref 5.0–8.0)

## 2018-11-13 LAB — C-REACTIVE PROTEIN: CRP: 12.4 mg/dL — ABNORMAL HIGH (ref <1.0)

## 2018-11-13 LAB — CBC WITH DIFFERENTIAL/PLATELET
Abs Immature Granulocytes: 0 10*3/uL (ref 0.00–0.07)
Basophils Absolute: 0 10*3/uL (ref 0.0–0.1)
Basophils Relative: 0 %
Eosinophils Absolute: 0 10*3/uL (ref 0.0–0.5)
Eosinophils Relative: 0 %
HCT: 43.7 % (ref 39.0–52.0)
Hemoglobin: 14.1 g/dL (ref 13.0–17.0)
Lymphocytes Relative: 10 %
Lymphs Abs: 0.8 10*3/uL (ref 0.7–4.0)
MCH: 26.9 pg (ref 26.0–34.0)
MCHC: 32.3 g/dL (ref 30.0–36.0)
MCV: 83.2 fL (ref 80.0–100.0)
Monocytes Absolute: 0.3 10*3/uL (ref 0.1–1.0)
Monocytes Relative: 4 %
Neutro Abs: 6.9 10*3/uL (ref 1.7–7.7)
Neutrophils Relative %: 86 %
Platelets: 207 10*3/uL (ref 150–400)
RBC: 5.25 MIL/uL (ref 4.22–5.81)
RDW: 13.4 % (ref 11.5–15.5)
WBC: 8 10*3/uL (ref 4.0–10.5)
nRBC: 0 % (ref 0.0–0.2)
nRBC: 0 /100 WBC

## 2018-11-13 LAB — COMPREHENSIVE METABOLIC PANEL
ALT: 69 U/L — ABNORMAL HIGH (ref 0–44)
AST: 78 U/L — ABNORMAL HIGH (ref 15–41)
Albumin: 3.1 g/dL — ABNORMAL LOW (ref 3.5–5.0)
Alkaline Phosphatase: 66 U/L (ref 38–126)
Anion gap: 11 (ref 5–15)
BUN: 12 mg/dL (ref 8–23)
CO2: 24 mmol/L (ref 22–32)
Calcium: 8.4 mg/dL — ABNORMAL LOW (ref 8.9–10.3)
Chloride: 101 mmol/L (ref 98–111)
Creatinine, Ser: 0.81 mg/dL (ref 0.61–1.24)
GFR calc Af Amer: >60 mL/min (ref >60)
GFR calc non Af Amer: >60 mL/min (ref >60)
Glucose, Bld: 185 mg/dL — ABNORMAL HIGH (ref 70–99)
Potassium: 3.9 mmol/L (ref 3.5–5.1)
Sodium: 136 mmol/L (ref 135–145)
Total Bilirubin: 0.9 mg/dL (ref 0.3–1.2)
Total Protein: 6.9 g/dL (ref 6.5–8.1)

## 2018-11-13 LAB — HEMOGLOBIN A1C
Hgb A1c MFr Bld: 7.1 % — ABNORMAL HIGH (ref 4.8–5.6)
Mean Plasma Glucose: 157.07 mg/dL

## 2018-11-13 LAB — LACTATE DEHYDROGENASE: LDH: 281 U/L — ABNORMAL HIGH (ref 98–192)

## 2018-11-13 LAB — ABO/RH: ABO/RH(D): A POS

## 2018-11-13 LAB — CBG MONITORING, ED: Glucose-Capillary: 175 mg/dL — ABNORMAL HIGH (ref 70–99)

## 2018-11-13 LAB — SARS CORONAVIRUS 2 BY RT PCR (HOSPITAL ORDER, PERFORMED IN ~~LOC~~ HOSPITAL LAB): SARS Coronavirus 2: POSITIVE — AB

## 2018-11-13 LAB — LACTIC ACID, PLASMA
Lactic Acid, Venous: 1 mmol/L (ref 0.5–1.9)
Lactic Acid, Venous: 0.8 mmol/L (ref 0.5–1.9)

## 2018-11-13 LAB — FERRITIN: Ferritin: 509 ng/mL — ABNORMAL HIGH (ref 24–336)

## 2018-11-13 LAB — SEDIMENTATION RATE: Sed Rate: 26 mm/hr — ABNORMAL HIGH (ref 0–16)

## 2018-11-13 LAB — D-DIMER, QUANTITATIVE: D-Dimer, Quant: 0.36 ug/mL-FEU (ref 0.00–0.50)

## 2018-11-13 MED ORDER — SODIUM CHLORIDE 0.9% FLUSH
3.0000 mL | Freq: Two times a day (BID) | INTRAVENOUS | Status: DC
  Administered 2018-11-14 – 2018-11-16 (×4): 3 mL via INTRAVENOUS

## 2018-11-13 MED ORDER — VITAMIN C 500 MG PO TABS
500.0000 mg | ORAL_TABLET | Freq: Every day | ORAL | Status: DC
  Administered 2018-11-14 – 2018-11-16 (×3): 500 mg via ORAL
  Filled 2018-11-13 (×3): qty 1

## 2018-11-13 MED ORDER — ACETAMINOPHEN 325 MG PO TABS: 650.0000 mg | ORAL_TABLET | Freq: Four times a day (QID) | ORAL | Status: DC | PRN

## 2018-11-13 MED ORDER — ZINC SULFATE 220 (50 ZN) MG PO CAPS
220.0000 mg | ORAL_CAPSULE | Freq: Every day | ORAL | Status: DC
  Administered 2018-11-14 – 2018-11-16 (×3): 220 mg via ORAL
  Filled 2018-11-13 (×3): qty 1

## 2018-11-13 MED ORDER — ENOXAPARIN SODIUM 40 MG/0.4ML ~~LOC~~ SOLN
40.0000 mg | Freq: Every day | SUBCUTANEOUS | Status: DC
  Administered 2018-11-14 – 2018-11-15 (×3): 40 mg via SUBCUTANEOUS
  Filled 2018-11-13 (×3): qty 0.4

## 2018-11-13 MED ORDER — ONDANSETRON HCL 4 MG PO TABS: 4.0000 mg | ORAL_TABLET | Freq: Four times a day (QID) | ORAL | Status: DC | PRN

## 2018-11-13 MED ORDER — SODIUM CHLORIDE 0.9 % IV SOLN
1.0000 g | INTRAVENOUS | Status: DC
  Administered 2018-11-13 – 2018-11-15 (×3): 1 g via INTRAVENOUS
  Filled 2018-11-13 (×3): qty 10
  Filled 2018-11-13: qty 1
  Filled 2018-11-13: qty 10

## 2018-11-13 MED ORDER — INSULIN ASPART 100 UNIT/ML ~~LOC~~ SOLN
0.0000 [IU] | Freq: Three times a day (TID) | SUBCUTANEOUS | Status: DC
  Administered 2018-11-14 (×3): 2 [IU] via SUBCUTANEOUS
  Administered 2018-11-15 – 2018-11-16 (×4): 1 [IU] via SUBCUTANEOUS

## 2018-11-13 MED ORDER — ONDANSETRON HCL 4 MG/2ML IJ SOLN
4.0000 mg | Freq: Four times a day (QID) | INTRAMUSCULAR | Status: DC | PRN
  Administered 2018-11-14: 4 mg via INTRAVENOUS
  Filled 2018-11-13: qty 2

## 2018-11-13 MED ORDER — GUAIFENESIN-DM 100-10 MG/5ML PO SYRP
10.0000 mL | ORAL_SOLUTION | ORAL | Status: DC | PRN
  Administered 2018-11-14: 10 mL via ORAL
  Filled 2018-11-13: qty 10

## 2018-11-13 MED ORDER — HYDROCOD POLST-CPM POLST ER 10-8 MG/5ML PO SUER: 5.0000 mL | Freq: Two times a day (BID) | ORAL | Status: DC | PRN

## 2018-11-13 MED ORDER — ACETAMINOPHEN 325 MG PO TABS
650.0000 mg | ORAL_TABLET | Freq: Once | ORAL | Status: AC
  Administered 2018-11-13: 650 mg via ORAL
  Filled 2018-11-13: qty 2

## 2018-11-13 MED ORDER — SODIUM CHLORIDE 0.9 % IV SOLN
500.0000 mg | INTRAVENOUS | Status: DC
  Administered 2018-11-14 – 2018-11-16 (×3): 500 mg via INTRAVENOUS
  Filled 2018-11-13 (×5): qty 500

## 2018-11-13 NOTE — ED Triage Notes (Signed)
CBG 175. 

## 2018-11-13 NOTE — H&P (Signed)
History and Physical    Samuel [name] DOB: 07/05/1956 DOA: 11/13/2018  PCP: [facility] [place]  Patient coming from: Home  I have personally briefly reviewed patient's old medical records in [place]  Chief Complaint: Fatigue, nausea, cough, chills  HPI: Samuel [name] is a 63 y.o. male with medical history significant for type 2 diabetes and remote history of multinodular goiter with partial thyroidectomy in [place] 1998 who presents to the ED with 3 days of generalized fatigue, cough productive of clear sputum, dyspnea, nausea with vomiting, and fevers.  Initially presented the ED on 11/10/2018 with non-intractable vomiting with nausea was treated with supportive care at this was felt due to viral gastroenteritis.  Labs at that time revealed elevated serum glucose of 205 but were otherwise unremarkable.  Portable chest x-ray showed a mild left basilar opacity which was felt to be persistent from 09/09/2018.  He has been feeling fairly well since that visit until worsening of symptoms 3 days prior to this admission.  He says he works in a [employer] but has not gone to work for 2-3 weeks due to the ongoing COVID-19 pandemic.  He lives in [place] and denies any recent travel or known sick contacts.  He denies any history of tobacco use, alcohol use, or illicit drug use.  ED Course:  Initial vitals showed BP 115/66, pulse 99, RR 20, temp 102.8 Fahrenheit, SPO2 initially 93% on room air.  SPO2 decreased to 87% on room air, he was placed on 2 L supplemental O2 via [place] with improvement in SPO2.  Labs are notable for AST 70, ALT 69, alk phos 66, T bili0.9, serum glucose 185, lactic acid 1.0.  Chemistry panel was otherwise unremarkable and CBC with differential was within normal limits.  Blood cultures were drawn and pending.  SARS-CoV-2 testing was obtained and resulted positive.  Portable chest x-ray showed worsening bibasilar opacities, left greater than right.  The  hospitalist service was consulted to admit for further evaluation management of acute on chronic respiratory failure with hypoxia secondary to COVID-19 infection.  Review of Systems: As per HPI otherwise 10 point review of systems negative.    Past Medical History:  Diagnosis Date  . Arthritis    Knees  . Diabetes mellitus without complication ([place])   . Goiter    Multinodular Goiter with partial thyroidectomey in [place]  1998.  Ultrasound confirming diagnosis in 01/17/2014    Past Surgical History:  Procedure Laterality Date  . APPENDECTOMY  2014  . THYROID SURGERY  1998   Enlarged and painful thyroid     reports that he has never smoked. He has never used smokeless tobacco. He reports that he does not drink alcohol or use drugs.  No Known Allergies  Family History  Problem Relation Age of Onset  . Arthritis Sister   . Arthritis Brother   . Prostate cancer Brother      Prior to Admission medications   Medication Sig Start Date End Date Taking? Authorizing Provider  amoxicillin (AMOXIL) 500 MG tablet Take 1,000 mg by mouth 2 (two) times a day. For 14 days Started on 3.30.20 10/22/18   [provider]  blood glucose meter kit and supplies KIT Check sugars twice daily, in morning when fasting and in evening before dinner 02/08/16   Mack Hook, MD  celecoxib (CELEBREX) 100 MG capsule Take 1 capsule (100 mg total) by mouth 2 (two) times daily. Patient not taking: Reported on 11/13/2018 09/09/18   Margarita Mail, PA-C  etodolac (LODINE) 400 MG tablet Take 1 tablet (400 mg total) by mouth 2 (two) times daily as needed. Patient not taking: Reported on 11/13/2018 08/13/18   Hilts, Legrand Como, MD  famotidine (PEPCID) 20 MG tablet Take 1 tablet (20 mg total) by mouth 2 (two) times daily. Patient not taking: Reported on 11/13/2018 09/09/18   Margarita Mail, PA-C  glucose blood test strip Use as instructed Patient not taking: Reported on 10/31/2017 02/08/16   Mack Hook,  MD  metFORMIN (GLUCOPHAGE-XR) 500 MG 24 hr tablet 1 tab by mouth daily with dinner Patient not taking: Reported on 08/13/2018 02/08/16   Mack Hook, MD  ondansetron (ZOFRAN-ODT) 4 MG disintegrating tablet Take 1 tablet (4 mg total) by mouth every 8 (eight) hours as needed for nausea or vomiting. Patient not taking: Reported on 11/13/2018 11/10/18   Davonna Belling, MD  traMADol (ULTRAM) 50 MG tablet Take 1 tablet (50 mg total) by mouth every 6 (six) hours as needed. Patient not taking: Reported on 11/13/2018 08/05/18   Orvan July, NP    Physical Exam: Vitals:   11/13/18 1830 11/13/18 1845 11/13/18 1900 11/13/18 1930  BP: 126/74 117/73 118/73 119/75  Pulse: 94 95 89 93  Resp: (!) 32 (!) 28 (!) 25 (!) 25  Temp:      TempSrc:      SpO2: 97% 97% 96% 96%  Weight:      Height:        Constitutional: Resting supine in bed, NAD, calm, appears tired but comfortable Eyes: PERRL, lids and conjunctivae normal ENMT: Mucous membranes are moist. Posterior pharynx clear of any exudate or lesions.Normal dentition.  Neck: normal, supple, no masses. Respiratory: Inspiratory crackles left base, lungs otherwise clear to auscultation.  Normal respiratory effort. No accessory muscle use.  Cardiovascular: Regular rate and rhythm, no murmurs / rubs / gallops. No extremity edema. 2+ pedal pulses. Abdomen: no tenderness, no masses palpated. No hepatosplenomegaly. Bowel sounds positive.  Musculoskeletal: no clubbing / cyanosis. No joint deformity upper and lower extremities. Good ROM, no contractures. Normal muscle tone.  Skin: no rashes, lesions, ulcers. No induration Neurologic: CN 2-12 grossly intact. Sensation intact, Strength 5/5 in all 4.  Psychiatric: Normal judgment and insight. Alert and oriented x 3. Normal mood.    Labs on Admission: I have personally reviewed following labs and imaging studies  CBC: Recent Labs  Lab 11/10/18 0913 11/13/18 1714  WBC 4.7 8.0  NEUTROABS 2.8 6.9  HGB  14.1 14.1  HCT 45.8 43.7  MCV 84.5 83.2  PLT 204 478   Basic Metabolic Panel: Recent Labs  Lab 11/10/18 0913 11/13/18 1714  NA 134* 136  K 3.6 3.9  CL 97* 101  CO2 25 24  GLUCOSE 205* 185*  BUN 12 12  CREATININE 0.97 0.81  CALCIUM 8.8* 8.4*   GFR: Estimated Creatinine Clearance: 117 mL/min (by C-G formula based on SCr of 0.81 mg/dL). Liver Function Tests: Recent Labs  Lab 11/10/18 0913 11/13/18 1714  AST 33 78*  ALT 37 69*  ALKPHOS 67 66  BILITOT 0.9 0.9  PROT 7.2 6.9  ALBUMIN 3.4* 3.1*   Recent Labs  Lab 11/10/18 0913  LIPASE 24   No results for input(s): AMMONIA in the last 168 hours. Coagulation Profile: No results for input(s): INR, PROTIME in the last 168 hours. Cardiac Enzymes: No results for input(s): CKTOTAL, CKMB, CKMBINDEX, TROPONINI in the last 168 hours. BNP (last 3 results) No results for input(s): PROBNP in the last 8760 hours. HbA1C:  Recent Labs    11/13/18 2026  HGBA1C 7.1*   CBG: Recent Labs  Lab 11/13/18 1715  GLUCAP 175*   Lipid Profile: No results for input(s): CHOL, HDL, LDLCALC, TRIG, CHOLHDL, LDLDIRECT in the last 72 hours. Thyroid Function Tests: No results for input(s): TSH, T4TOTAL, FREET4, T3FREE, THYROIDAB in the last 72 hours. Anemia Panel: No results for input(s): VITAMINB12, FOLATE, FERRITIN, TIBC, IRON, RETICCTPCT in the last 72 hours. Urine analysis:    Component Value Date/Time   COLORURINE YELLOW 09/09/2018 0646   APPEARANCEUR CLEAR 09/09/2018 0646   LABSPEC 1.018 09/09/2018 0646   PHURINE 5.0 09/09/2018 0646   GLUCOSEU NEGATIVE 09/09/2018 0646   HGBUR NEGATIVE 09/09/2018 0646   BILIRUBINUR NEGATIVE 09/09/2018 0646   KETONESUR NEGATIVE 09/09/2018 0646   PROTEINUR NEGATIVE 09/09/2018 0646   UROBILINOGEN 0.2 03/02/2012 1632   NITRITE NEGATIVE 09/09/2018 0646   LEUKOCYTESUR NEGATIVE 09/09/2018 0646    Radiological Exams on Admission: Dg Chest Portable 1 View  Result Date: 11/13/2018 CLINICAL DATA:   Fever and cough. EXAM: PORTABLE CHEST 1 VIEW COMPARISON:  11/10/2018. FINDINGS: Reduced lung volumes compared to priors. Bibasilar opacities are increased, LEFT greater than RIGHT, is consistent with developing pneumonia, with probable superimposed subsegmental atelectasis. Cardiomegaly. IMPRESSION: Worsening aeration. Increasing bibasilar opacities, LEFT greater than RIGHT. Electronically Signed   By: Staci Righter M.D.   On: 11/13/2018 17:56    EKG: Not obtained.  Assessment/Plan Principal Problem:   Acute on chronic respiratory failure with hypoxia (HCC) Active Problems:   COVID-19 virus infection   Type 2 diabetes mellitus ([place])  Samuel [name] is a 63 y.o. male with medical history significant for type 2 diabetes and remote history of multinodular goiter with partial thyroidectomy in [place] 1998 who is admitted with acute on chronic respiratory failure with hypoxia secondary to COVID-19 infection.   Acute on chronic respiratory failure with hypoxia due to COVID-19 infection: Currently requiring 2 L supplemental O2 via [place].  He has worsening basilar opacities on chest x-ray, left greater than right concerning for potential secondary bacterial infection.  No beds available at [facility] night. -Admit to 2W - low risk for transmission: Continue droplet and contact precautions with eye protection -Continue supplemental oxygen, wean off as able -Add on influenza and respiratory viral panel -Start empiric IV ceftriaxone and azithromycin -Check ESR, CRP, d-dimer, ferritin, G6PD, IL-6, LDH, procalcitonin, strep pneumo and Legionella urine antigens -Monitor CMP and CBC with differential -Continue telemetry to monitor QTC -Follow-up blood cultures -Continue Lovenox VTE prophylaxis  Type 2 diabetes: A1c 7.1%.  He says he is not currently on medical management for his diabetes. -Start sensitive SSI with Accu-Cheks and hypoglycemia protocol  DVT prophylaxis: SQ Lovenox Code Status: Code,  confirmed with patient Family Communication: Discussed with patient's wife Samuel [name] by phone, cell phone number [phone] updated in chart Disposition Plan: Pending clinical progress Consults called: None Admission status: Inpatient, patient likely requires greater than 2 midnight stay for management of acute respiratory failure with hypoxia secondary to COVID-19 infection with potential secondary bacterial infection as he is high risk for decompensation and increased oxygen requirements.  Zada Finders MD [facility] Pager [phone]  If 7PM-7AM, please contact night-coverage [email or web address]  11/13/2018, 9:08 PM

## 2018-11-13 NOTE — ED Triage Notes (Signed)
PT has fever of 102.8 and a cough on arrival to room. Pt is coughing for 2-3 days. Pt reports Fever for 3-4 days . Pt also has lab results of A-1 C  Of 7 from Lab core drawn yesterday.

## 2018-11-13 NOTE — ED Provider Notes (Signed)
[facility] EMERGENCY DEPARTMENT Provider Note   CSN: [id number] Arrival date & time: 11/13/18  1603    History   Chief Complaint Chief Complaint  Patient presents with  . Fever    HPI Samuel [name] is a 63 y.o. male.     Patient is a 63 year old male with past medical history of diabetes presents the emergency department feeling unwell.  Patient reports that this all started about 3 days ago.  Reports it started with some tightness in his chest.  Reports that he induced vomiting and this helped him feel better.  Reports that he has continued to have coughing, fever, tightness in his chest.  Patient is a poor historian.  He was seen on the 18th of this month in the emergency department for nausea vomiting.  Symptoms resolved and blood work was unremarkable.  Reports that he has still felt the same since then but has also been having a cough and trouble sleeping at night because of the cough and his throat feeling dry.  Patient seems a little bit below his symptoms.  Patient initially stated that he was sent here from his primary care doctor because his hemoglobin A1c was 7.0  And he presents a paperwork from [place] that states his hemoglobin A1c is 7.     Past Medical History:  Diagnosis Date  . Arthritis    Knees  . Diabetes mellitus without complication ([place])   . Goiter    Multinodular Goiter with partial thyroidectomey in [place]  1998.  Ultrasound confirming diagnosis in 01/17/2014    Patient Active Problem List   Diagnosis Date Noted  . Unspecified vitamin D deficiency 03/20/2014  . Dental cavities 03/20/2014  . IFG (impaired fasting glucose) 02/26/2014  . Multinodular goiter 02/26/2014  . Goiter     Past Surgical History:  Procedure Laterality Date  . APPENDECTOMY  2014  . THYROID SURGERY  1998   Enlarged and painful thyroid        Home Medications    Prior to Admission medications   Medication Sig Start Date End Date Taking? Authorizing  Provider  amoxicillin (AMOXIL) 500 MG tablet Take 1,000 mg by mouth 2 (two) times a day. For 14 days Started on 3.30.20 10/22/18   [provider]  blood glucose meter kit and supplies KIT Check sugars twice daily, in morning when fasting and in evening before dinner 02/08/16   Mack Hook, MD  celecoxib (CELEBREX) 100 MG capsule Take 1 capsule (100 mg total) by mouth 2 (two) times daily. Patient not taking: Reported on 11/13/2018 09/09/18   Margarita Mail, PA-C  etodolac (LODINE) 400 MG tablet Take 1 tablet (400 mg total) by mouth 2 (two) times daily as needed. Patient not taking: Reported on 11/13/2018 08/13/18   Hilts, Legrand Como, MD  famotidine (PEPCID) 20 MG tablet Take 1 tablet (20 mg total) by mouth 2 (two) times daily. Patient not taking: Reported on 11/13/2018 09/09/18   Margarita Mail, PA-C  glucose blood test strip Use as instructed Patient not taking: Reported on 10/31/2017 02/08/16   Mack Hook, MD  metFORMIN (GLUCOPHAGE-XR) 500 MG 24 hr tablet 1 tab by mouth daily with dinner Patient not taking: Reported on 08/13/2018 02/08/16   Mack Hook, MD  ondansetron (ZOFRAN-ODT) 4 MG disintegrating tablet Take 1 tablet (4 mg total) by mouth every 8 (eight) hours as needed for nausea or vomiting. Patient not taking: Reported on 11/13/2018 11/10/18   Davonna Belling, MD  traMADol (ULTRAM) 50 MG tablet  Take 1 tablet (50 mg total) by mouth every 6 (six) hours as needed. Patient not taking: Reported on 11/13/2018 08/05/18   Orvan July, NP    Family History Family History  Problem Relation Age of Onset  . Arthritis Sister   . Arthritis Brother   . Prostate cancer Brother     Social History Social History   Tobacco Use  . Smoking status: Never Smoker  . Smokeless tobacco: Never Used  Substance Use Topics  . Alcohol use: No    Alcohol/week: 0.0 standard drinks  . Drug use: No     Allergies   Patient has no known allergies.   Review of Systems Review of  Systems  Constitutional: Positive for appetite change, fatigue and fever.  HENT: Positive for congestion.   Respiratory: Positive for chest tightness and shortness of breath. Negative for wheezing.   Cardiovascular: Negative for chest pain, palpitations and leg swelling.  Gastrointestinal: Positive for nausea and vomiting. Negative for abdominal pain.  Genitourinary: Negative.   Musculoskeletal: Negative for back pain.  Neurological: Negative for dizziness, light-headedness and headaches.     Physical Exam Updated Vital Signs BP 119/75   Pulse 93   Temp (!) 102.8 F (39.3 C) (Oral)   Resp (!) 25   Ht 6' (1.829 m)   Wt 99.7 kg   SpO2 96%   BMI 29.81 kg/m   Physical Exam Vitals signs and nursing note reviewed.  Constitutional:      General: He is not in acute distress.    Appearance: He is well-developed. He is not toxic-appearing or diaphoretic.     Comments: Appears uncomfortable  HENT:     Head: Normocephalic and atraumatic.     Nose: Nose normal. No congestion or rhinorrhea.  Eyes:     Conjunctiva/sclera: Conjunctivae normal.     Pupils: Pupils are equal, round, and reactive to light.  Neck:     Musculoskeletal: Neck supple.  Cardiovascular:     Rate and Rhythm: Normal rate and regular rhythm.     Heart sounds: No murmur.  Pulmonary:     Effort: Pulmonary effort is normal. No respiratory distress.     Breath sounds: Wheezing (mild left lower) present.  Abdominal:     Palpations: Abdomen is soft.     Tenderness: There is no abdominal tenderness.  Skin:    General: Skin is warm and dry.     Capillary Refill: Capillary refill takes less than 2 seconds.  Neurological:     General: No focal deficit present.     Mental Status: He is alert.  Psychiatric:        Mood and Affect: Mood normal.      ED Treatments / Results  Labs (all labs ordered are listed, but only abnormal results are displayed) Labs Reviewed  SARS CORONAVIRUS 2 ([facility]) - Abnormal; Notable for the following components:      Result Value   SARS Coronavirus 2 POSITIVE (*)    All other components within normal limits  COMPREHENSIVE METABOLIC PANEL - Abnormal; Notable for the following components:   Glucose, Bld 185 (*)    Calcium 8.4 (*)    Albumin 3.1 (*)    AST 78 (*)    ALT 69 (*)    All other components within normal limits  CBG MONITORING, ED - Abnormal; Notable for the following components:   Glucose-Capillary 175 (*)    All other components within normal  limits  CULTURE, BLOOD (ROUTINE X 2)  CULTURE, BLOOD (ROUTINE X 2)  CBC WITH DIFFERENTIAL/PLATELET  LACTIC ACID, PLASMA  HIV ANTIBODY (ROUTINE TESTING W REFLEX)  URINALYSIS, ROUTINE W REFLEX MICROSCOPIC  LACTIC ACID, PLASMA    EKG None  Radiology Dg Chest Portable 1 View  Result Date: 11/13/2018 CLINICAL DATA:  Fever and cough. EXAM: PORTABLE CHEST 1 VIEW COMPARISON:  11/10/2018. FINDINGS: Reduced lung volumes compared to priors. Bibasilar opacities are increased, LEFT greater than RIGHT, is consistent with developing pneumonia, with probable superimposed subsegmental atelectasis. Cardiomegaly. IMPRESSION: Worsening aeration. Increasing bibasilar opacities, LEFT greater than RIGHT. Electronically Signed   By: Staci Righter M.D.   On: 11/13/2018 17:56    Procedures Procedures (including critical care time)  Medications Ordered in ED Medications  acetaminophen (TYLENOL) tablet 650 mg (650 mg Oral Given 11/13/18 1818)     Initial Impression / Assessment and Plan / ED Course  I have reviewed the triage vital signs and the nursing notes.  Pertinent labs & imaging results that were available during my care of the patient were reviewed by me and considered in my medical decision making (see chart for details).  Clinical Course as of Nov 12 1949  Tue Nov 13, 2982  [id number] 63 year old African-American male presenting with multiple symptoms including cough, fever,  generalized fatigue.  Patient was initially 87% oxygen on room air and febrile.  Work-up for coronavirus as well as other septic work-up initiated.  Patient with normal oxygen sats on 2 L of oxygen via [place] he denies any sick contacts or recent travel.   [KM]    Clinical Course User Index [KM] Alveria Apley, PA-C       CRITICAL CARE Performed by: Alveria Apley   Total critical care time: 40 minutes  Critical care time was exclusive of separately billable procedures and treating other patients.  Critical care was necessary to treat or prevent imminent or life-threatening deterioration.  Critical care was time spent personally by me on the following activities: development of treatment plan with patient and/or surrogate as well as nursing, discussions with consultants, evaluation of patient's response to treatment, examination of patient, obtaining history from patient or surrogate, ordering and performing treatments and interventions, ordering and review of laboratory studies, ordering and review of radiographic studies, pulse oximetry and re-evaluation of patient's condition.   Final Clinical Impressions(s) / ED Diagnoses   Final diagnoses:  COVID-19 virus infection    ED Discharge Orders    None       Alveria Apley, PA-C 11/13/18 1951    Lennice Sites, DO 11/14/18 1106

## 2018-11-14 ENCOUNTER — Other Ambulatory Visit: Payer: Self-pay

## 2018-11-14 ENCOUNTER — Encounter (HOSPITAL_COMMUNITY): Payer: Self-pay

## 2018-11-14 LAB — CBC WITH DIFFERENTIAL/PLATELET
Abs Immature Granulocytes: 0.03 10*3/uL (ref 0.00–0.07)
Basophils Absolute: 0 10*3/uL (ref 0.0–0.1)
Basophils Relative: 0 %
Eosinophils Absolute: 0 10*3/uL (ref 0.0–0.5)
Eosinophils Relative: 0 %
HCT: 41.7 % (ref 39.0–52.0)
Hemoglobin: 13.4 g/dL (ref 13.0–17.0)
Immature Granulocytes: 0 %
Lymphocytes Relative: 10 %
Lymphs Abs: 0.8 10*3/uL (ref 0.7–4.0)
MCH: 26.6 pg (ref 26.0–34.0)
MCHC: 32.1 g/dL (ref 30.0–36.0)
MCV: 82.7 fL (ref 80.0–100.0)
Monocytes Absolute: 0.3 10*3/uL (ref 0.1–1.0)
Monocytes Relative: 4 %
Neutro Abs: 6.7 10*3/uL (ref 1.7–7.7)
Neutrophils Relative %: 86 %
Platelets: 188 10*3/uL (ref 150–400)
RBC: 5.04 MIL/uL (ref 4.22–5.81)
RDW: 13.7 % (ref 11.5–15.5)
WBC: 7.8 10*3/uL (ref 4.0–10.5)
nRBC: 0 % (ref 0.0–0.2)

## 2018-11-14 LAB — RESPIRATORY PANEL BY PCR

## 2018-11-14 LAB — COMPREHENSIVE METABOLIC PANEL
ALT: 86 U/L — ABNORMAL HIGH (ref 0–44)
AST: 113 U/L — ABNORMAL HIGH (ref 15–41)
Albumin: 2.7 g/dL — ABNORMAL LOW (ref 3.5–5.0)
Alkaline Phosphatase: 68 U/L (ref 38–126)
Anion gap: 9 (ref 5–15)
BUN: 12 mg/dL (ref 8–23)
CO2: 25 mmol/L (ref 22–32)
Calcium: 8 mg/dL — ABNORMAL LOW (ref 8.9–10.3)
Chloride: 102 mmol/L (ref 98–111)
Creatinine, Ser: 0.81 mg/dL (ref 0.61–1.24)
GFR calc Af Amer: >60 mL/min (ref >60)
GFR calc non Af Amer: >60 mL/min (ref >60)
Glucose, Bld: 227 mg/dL — ABNORMAL HIGH (ref 70–99)
Potassium: 3.7 mmol/L (ref 3.5–5.1)
Sodium: 136 mmol/L (ref 135–145)
Total Bilirubin: 0.8 mg/dL (ref 0.3–1.2)
Total Protein: 6.4 g/dL — ABNORMAL LOW (ref 6.5–8.1)

## 2018-11-14 LAB — INFLUENZA PANEL BY PCR (TYPE A & B)
Influenza A By PCR: NEGATIVE
Influenza B By PCR: NEGATIVE

## 2018-11-14 LAB — D-DIMER, QUANTITATIVE: D-Dimer, Quant: 0.37 ug/mL-FEU (ref 0.00–0.50)

## 2018-11-14 LAB — STREP PNEUMONIAE URINARY ANTIGEN: Strep Pneumo Urinary Antigen: NEGATIVE

## 2018-11-14 LAB — GLUCOSE, CAPILLARY
Glucose-Capillary: 158 mg/dL — ABNORMAL HIGH (ref 70–99)
Glucose-Capillary: 166 mg/dL — ABNORMAL HIGH (ref 70–99)
Glucose-Capillary: 171 mg/dL — ABNORMAL HIGH (ref 70–99)
Glucose-Capillary: 174 mg/dL — ABNORMAL HIGH (ref 70–99)
Glucose-Capillary: 181 mg/dL — ABNORMAL HIGH (ref 70–99)

## 2018-11-14 LAB — MAGNESIUM: Magnesium: 1.9 mg/dL (ref 1.7–2.4)

## 2018-11-14 LAB — HIV ANTIBODY (ROUTINE TESTING W REFLEX): HIV Screen 4th Generation wRfx: NONREACTIVE

## 2018-11-14 LAB — C-REACTIVE PROTEIN: CRP: 14.5 mg/dL — ABNORMAL HIGH (ref <1.0)

## 2018-11-14 LAB — PROCALCITONIN: Procalcitonin: 0.18 ng/mL

## 2018-11-14 MED ORDER — MELATONIN 3 MG PO TABS
3.0000 mg | ORAL_TABLET | Freq: Every evening | ORAL | Status: DC | PRN
  Administered 2018-11-14 (×2): 3 mg via ORAL
  Filled 2018-11-14 (×3): qty 1

## 2018-11-14 MED ORDER — HYDROXYCHLOROQUINE SULFATE 200 MG PO TABS
400.0000 mg | ORAL_TABLET | Freq: Two times a day (BID) | ORAL | Status: AC
  Administered 2018-11-14 (×2): 400 mg via ORAL
  Filled 2018-11-14 (×2): qty 2

## 2018-11-14 MED ORDER — HYDROXYCHLOROQUINE SULFATE 200 MG PO TABS
200.0000 mg | ORAL_TABLET | Freq: Two times a day (BID) | ORAL | Status: DC
  Administered 2018-11-15: 200 mg via ORAL
  Filled 2018-11-14: qty 1

## 2018-11-14 NOTE — Plan of Care (Signed)

## 2018-11-14 NOTE — Progress Notes (Signed)
PROGRESS NOTE    Samuel [name]  QJF:[id number] DOB: April 30, 1956 DOA: 11/13/2018 PCP: [facility]    Brief Narrative: 63 year old with past medical history significant for diabetes type 2, multi-nodular goiter, partial thyroidectomy exam in 1998 presented with generalized fatigue present, productive cough, nausea vomiting, shortness of breath and fever.  Patient was evaluated initially in the ED on 4/18 for vomiting and nausea, treated with supportive care.  Patient present again due to worsening symptoms sent shortness of breath and cough.  Initial vitals showed BP 115/66, pulse 99, RR 20, temp 102.8 Fahrenheit, SPO2 initially 93% on room air.  SPO2 decreased to 87% on room air, he was placed on 2 L supplemental O2 via [facility] with improvement in SPO2.  Labs are notable for AST 70, ALT 69, alk phos 66, T bili0.9, serum glucose 185, lactic acid 1.0.  Chemistry panel was otherwise unremarkable and CBC with differential was within normal limits.  Blood cultures were drawn and pending.  SARS-CoV-2 testing was obtained and resulted positive.  Assessment & Plan:   Principal Problem:   Acute on chronic respiratory failure with hypoxia (HCC) Active Problems:   COVID-19 virus infection   Type 2 diabetes mellitus (HCC)   1-Acute on chronic respiratory failure when he needs to hypoxemia due to COVID-19. Patient risk factor age: He is 63 year old, history of diabetes. He presents requiring 2 L of oxygen supplementation.  Chest x-ray show worsening bibasilar opacity left more than right. He was a started on ceftriaxone and a azithromycin.  We will continue for now. He is a chest to score is 52 at this point less than 1% Interleukin-6 pending. I have discussed with the patient and we are going to start him on Plaquenil.  EKG normal QT.  On Lovenox for DVT prophylaxis. Continue  with vitamin C and zinc. CRP increased today to 14 from 12. LDH 281.  Procalcitonin less than 0.1. D-dimer  0.37--yesterday 0.36 Blood Cultures no growth today. Discussed with patient he is okay with being transferred to [facility] if bed available  2-diabetes type 2: Continue with sliding scale insulin. 3-Transaminases; related to covid. Follow trend.   Estimated body mass index is 29.33 kg/m as calculated from the following:   Height as of this encounter: 6' (1.829 m).   Weight as of this encounter: 98.1 kg.   DVT prophylaxis: lovenox Code Status: full code Family Communication: Will update wife Disposition Plan: remain in the hospital for treatment of COVID, and monitor market of HS.    Consultants:   None   Procedures:   none   Antimicrobials:  Ceftriaxone, azithromycin.    Subjective: He is feeling very weak, tired, fatigue.  Still coughing.  He agree to start taking hydroxychloroquine.  Objective: Vitals:   11/13/18 2145 11/13/18 2243 11/14/18 0000 11/14/18 0732  BP: 112/71 114/70  105/69  Pulse: 88 94  88  Resp: _0 Temp:  99.8 F (37.7 C)  99.3 F (37.4 C)  TempSrc:  Oral  Oral  SpO2: 97% 95% 98% 95%  Weight:  98.1 kg    Height:  6' (1.829 m)      Intake/Output Summary (Last 24 hours) at 11/14/2018 1336 Last data filed at 11/14/2018 0917 Gross per 24 hour  Intake 233 ml  Output 1 ml  Net 232 ml   Filed Weights   11/13/18 1656 11/13/18 2243  Weight: 99.7 kg 98.1 kg    Examination:  General exam: Appears calm and  comfortable  Respiratory system: Clear to auscultation. Respiratory effort normal. Cardiovascular system: S1 & S2 heard, RRR. No JVD, murmurs, rubs, gallops or clicks. No pedal edema. Gastrointestinal system: Abdomen is nondistended, soft and nontender. No organomegaly or masses felt. Normal bowel sounds heard. Central nervous system: Alert and oriented. No focal neurological deficits. Extremities: Symmetric 5 x 5 power. Skin: No rashes, lesions or ulcers   Data Reviewed: I have personally reviewed following labs and  imaging studies  CBC: Recent Labs  Lab 11/10/18 0913 11/13/18 1714 11/14/18 0550  WBC 4.7 8.0 7.8  NEUTROABS 2.8 6.9 6.7  HGB 14.1 14.1 13.4  HCT 45.8 43.7 41.7  MCV 84.5 83.2 82.7  PLT 204 207 656   Basic Metabolic Panel: Recent Labs  Lab 11/10/18 0913 11/13/18 1714 11/14/18 0550  NA 134* 136 136  K 3.6 3.9 3.7  CL 97* 101 102  CO2 _0 GLUCOSE 205* 185* 227*  BUN _1 CREATININE 0.97 0.81 0.81  CALCIUM 8.8* 8.4* 8.0*  MG  --   --  1.9   GFR: Estimated Creatinine Clearance: 116.2 mL/min (by C-G formula based on SCr of 0.81 mg/dL). Liver Function Tests: Recent Labs  Lab 11/10/18 0913 11/13/18 1714 11/14/18 0550  AST 33 78* 113*  ALT 37 69* 86*  ALKPHOS 67 66 68  BILITOT 0.9 0.9 0.8  PROT 7.2 6.9 6.4*  ALBUMIN 3.4* 3.1* 2.7*   Recent Labs  Lab 11/10/18 0913  LIPASE 24   No results for input(s): AMMONIA in the last 168 hours. Coagulation Profile: No results for input(s): INR, PROTIME in the last 168 hours. Cardiac Enzymes: No results for input(s): CKTOTAL, CKMB, CKMBINDEX, TROPONINI in the last 168 hours. BNP (last 3 results) No results for input(s): PROBNP in the last 8760 hours. HbA1C: Recent Labs    11/13/18 2026  HGBA1C 7.1*   CBG: Recent Labs  Lab 11/13/18 1715 11/14/18 0037 11/14/18 0728 11/14/18 1154  GLUCAP 175* 181* 174* 166*   Lipid Profile: No results for input(s): CHOL, HDL, LDLCALC, TRIG, CHOLHDL, LDLDIRECT in the last 72 hours. Thyroid Function Tests: No results for input(s): TSH, T4TOTAL, FREET4, T3FREE, THYROIDAB in the last 72 hours. Anemia Panel: Recent Labs    11/13/18 2026  FERRITIN 509*   Sepsis Labs: Recent Labs  Lab 11/13/18 1725 11/13/18 1938 11/13/18 2026  PROCALCITON  --   --  0.18  LATICACIDVEN 1.0 0.8  --     Recent Results (from the past 240 hour(s))  Blood culture (routine x 2)     Status: None (Preliminary result)   Collection Time: 11/13/18  4:59 PM  Result Value Ref Range Status    Specimen Description BLOOD LEFT ANTECUBITAL  Final   Special Requests   Final    BOTTLES DRAWN AEROBIC AND ANAEROBIC Blood Culture results may not be optimal due to an excessive volume of blood received in culture bottles   Culture   Final    NO GROWTH < 24 HOURS Performed at [facility] [address], [place], [place] [address]    Report Status PENDING  Incomplete  SARS Coronavirus 2 [facility] order, Performed in [facility] hospital lab)     Status: Abnormal   Collection Time: 11/13/18  5:25 PM  Result Value Ref Range Status   SARS Coronavirus 2 POSITIVE (A) NEGATIVE Final    Comment: RESULT CALLED TO, READ BACK BY AND VERIFIED WITH: A MCKEOWN RN 1902 11/12/19 A BROWNING (NOTE) If  result is NEGATIVE SARS-CoV-2 target nucleic acids are NOT DETECTED. The SARS-CoV-2 RNA is generally detectable in upper and lower  respiratory specimens during the acute phase of infection. The lowest  concentration of SARS-CoV-2 viral copies this assay can detect is 250  copies / mL. A negative result does not preclude SARS-CoV-2 infection  and should not be used as the sole basis for treatment or other  patient management decisions.  A negative result may occur with  improper specimen collection / handling, submission of specimen other  than nasopharyngeal swab, presence of viral mutation(s) within the  areas targeted by this assay, and inadequate number of viral copies  (<250 copies / mL). A negative result must be combined with clinical  observations, patient history, and epidemiological information. If result is POSITIVE SARS-CoV-2 target nucleic acids are DETECTED.  The SARS-CoV-2 RNA is generally detectable in upper and lower  respiratory specimens during the acute phase of infection.  Positive  results are indicative of active infection with SARS-CoV-2.  Clinical  correlation with patient history and other diagnostic information is  necessary to determine patient infection status.   Positive results do  not rule out bacterial infection or co-infection with other viruses. If result is PRESUMPTIVE POSTIVE SARS-CoV-2 nucleic acids MAY BE PRESENT.   A presumptive positive result was obtained on the submitted specimen  and confirmed on repeat testing.  While 2019 novel coronavirus  (SARS-CoV-2) nucleic acids may be present in the submitted sample  additional confirmatory testing may be necessary for epidemiological  and / or clinical management purposes  to differentiate between  SARS-CoV-2 and other Sarbecovirus currently known to infect humans.  If clinically indicated additional testing with an alternate test  methodology [phone]) i s advised. The SARS-CoV-2 RNA is generally  detectable in upper and lower respiratory specimens during the acute  phase of infection. The expected result is Negative. Fact Sheet for Patients:  StrictlyIdeas.no Fact Sheet for Healthcare Providers: BankingDealers.co.za This test is not yet approved or cleared by the [place] FDA and has been authorized for detection and/or diagnosis of SARS-CoV-2 by FDA under an Emergency Use Authorization (EUA).  This EUA will remain in effect (meaning this test can be used) for the duration of the COVID-19 declaration under Section 564(b)(1) of the Act, 21 U.S.C. section 360bbb-3(b)(1), unless the authorization is terminated or revoked sooner. Performed at [facility], [place] [address], [place], [place] [address]   Blood culture (routine x 2)     Status: None (Preliminary result)   Collection Time: 11/13/18  7:38 PM  Result Value Ref Range Status   Specimen Description BLOOD LEFT HAND  Final   Special Requests   Final    BOTTLES DRAWN AEROBIC AND ANAEROBIC Blood Culture results may not be optimal due to an excessive volume of blood received in culture bottles   Culture   Final    NO GROWTH < 24 HOURS Performed at [facility], [place] [address] [place], [place] [address]    Report Status PENDING  Incomplete         Radiology Studies: Dg Chest Portable 1 View  Result Date: 11/13/2018 CLINICAL DATA:  Fever and cough. EXAM: PORTABLE CHEST 1 VIEW COMPARISON:  11/10/2018. FINDINGS: Reduced lung volumes compared to priors. Bibasilar opacities are increased, LEFT greater than RIGHT, is consistent with developing pneumonia, with probable superimposed subsegmental atelectasis. Cardiomegaly. IMPRESSION: Worsening aeration. Increasing bibasilar opacities, LEFT greater than RIGHT. Electronically Signed   By: Jenny Reichmann  Alfonse Flavors M.D.   On: 11/13/2018 17:56        Scheduled Meds: . enoxaparin (LOVENOX) injection  40 mg Subcutaneous QHS  . hydroxychloroquine  400 mg Oral BID   Followed by  . [START ON 11/15/2018] hydroxychloroquine  200 mg Oral BID  . insulin aspart  0-9 Units Subcutaneous TID WC  . sodium chloride flush  3 mL Intravenous Q12H  . vitamin C  500 mg Oral Daily  . zinc sulfate  220 mg Oral Daily   Continuous Infusions: . azithromycin 500 mg (11/14/18 0005)  . cefTRIAXone (ROCEPHIN)  IV Stopped (11/14/18 0003)     LOS: 1 day    Time spent: 35 minutes.     Elmarie Shiley, MD [facility] Pager [phone]  If 7PM-7AM, please contact night-coverage [email or web address] Password [facility] 11/14/2018, 1:36 PM

## 2018-11-14 NOTE — Progress Notes (Signed)
Daily Nursing Note Received report on patient and introduced self. Patient endorsed no complaints other than hunger. EKG gotten QTC 404 --> started on plaquenil. Patient still requires no oxygen. . Educated on home medications and the need for them to be placed in the patient closet. Patient requested sleep aid --> melatonin ordered --> patient requested in early evening d/t lack of sleep from the sound of the vent at bedside. OOB ad lib. Variable PO intake (+) 2 e/o clear spit up w/o large volume emesis. All needs met throughout shift.   Glucometer: Blood Glucose --> 174-->166-->171  Daily Updates: Patients wife, Magda updated via telephone.   DC Plan: Discharge to home with no needs versus to [facility] (transfer pending) when medically appropriate.

## 2018-11-14 NOTE — Progress Notes (Addendum)
Patient arrived to unit around 2230, alert and oriented x4, denies pain with assess. Arrived on 4 L oxygen, however, 98% on RA with 5 minute assess.  Remains on room air at this time.  Report received from [facility] in the emergency department. Patient has bag of medication at bedside, educated not to take any medication while inpatient, informed all medications will be administered by nursing staff.  Patient verbalized understanding.  Medications remain with patient due to isolation status. Stable condition.

## 2018-11-15 LAB — LEGIONELLA PNEUMOPHILA SEROGP 1 UR AG: L. pneumophila Serogp 1 Ur Ag: NEGATIVE

## 2018-11-15 LAB — COMPREHENSIVE METABOLIC PANEL
ALT: 85 U/L — ABNORMAL HIGH (ref 0–44)
AST: 103 U/L — ABNORMAL HIGH (ref 15–41)
Albumin: 2.4 g/dL — ABNORMAL LOW (ref 3.5–5.0)
Alkaline Phosphatase: 101 U/L (ref 38–126)
Anion gap: 9 (ref 5–15)
BUN: 13 mg/dL (ref 8–23)
CO2: 27 mmol/L (ref 22–32)
Calcium: 8.1 mg/dL — ABNORMAL LOW (ref 8.9–10.3)
Chloride: 102 mmol/L (ref 98–111)
Creatinine, Ser: 1.03 mg/dL (ref 0.61–1.24)
GFR calc Af Amer: >60 mL/min (ref >60)
GFR calc non Af Amer: >60 mL/min (ref >60)
Glucose, Bld: 149 mg/dL — ABNORMAL HIGH (ref 70–99)
Potassium: 3.8 mmol/L (ref 3.5–5.1)
Sodium: 138 mmol/L (ref 135–145)
Total Bilirubin: 1.1 mg/dL (ref 0.3–1.2)
Total Protein: 6.4 g/dL — ABNORMAL LOW (ref 6.5–8.1)

## 2018-11-15 LAB — CBC WITH DIFFERENTIAL/PLATELET
Abs Immature Granulocytes: 0.04 10*3/uL (ref 0.00–0.07)
Basophils Absolute: 0 10*3/uL (ref 0.0–0.1)
Basophils Relative: 0 %
Eosinophils Absolute: 0 10*3/uL (ref 0.0–0.5)
Eosinophils Relative: 0 %
HCT: 38.9 % — ABNORMAL LOW (ref 39.0–52.0)
Hemoglobin: 12.5 g/dL — ABNORMAL LOW (ref 13.0–17.0)
Immature Granulocytes: 1 %
Lymphocytes Relative: 14 %
Lymphs Abs: 1.2 10*3/uL (ref 0.7–4.0)
MCH: 26.7 pg (ref 26.0–34.0)
MCHC: 32.1 g/dL (ref 30.0–36.0)
MCV: 83.1 fL (ref 80.0–100.0)
Monocytes Absolute: 0.5 10*3/uL (ref 0.1–1.0)
Monocytes Relative: 6 %
Neutro Abs: 6.7 10*3/uL (ref 1.7–7.7)
Neutrophils Relative %: 79 %
Platelets: 232 10*3/uL (ref 150–400)
RBC: 4.68 MIL/uL (ref 4.22–5.81)
RDW: 13.8 % (ref 11.5–15.5)
WBC: 8.4 10*3/uL (ref 4.0–10.5)
nRBC: 0 % (ref 0.0–0.2)

## 2018-11-15 LAB — FERRITIN: Ferritin: 753 ng/mL — ABNORMAL HIGH (ref 24–336)

## 2018-11-15 LAB — D-DIMER, QUANTITATIVE: D-Dimer, Quant: 0.32 ug/mL-FEU (ref 0.00–0.50)

## 2018-11-15 LAB — GLUCOSE 6 PHOSPHATE DEHYDROGENASE
G6PDH: 8.1 U/g{Hb} (ref 4.6–13.5)
Hemoglobin: 14.3 g/dL (ref 13.0–17.7)

## 2018-11-15 LAB — TRIGLYCERIDES: Triglycerides: 123 mg/dL (ref <150)

## 2018-11-15 LAB — GLUCOSE, CAPILLARY
Glucose-Capillary: 131 mg/dL — ABNORMAL HIGH (ref 70–99)
Glucose-Capillary: 123 mg/dL — ABNORMAL HIGH (ref 70–99)
Glucose-Capillary: 127 mg/dL — ABNORMAL HIGH (ref 70–99)
Glucose-Capillary: 186 mg/dL — ABNORMAL HIGH (ref 70–99)

## 2018-11-15 LAB — FIBRINOGEN: Fibrinogen: 718 mg/dL — ABNORMAL HIGH (ref 210–475)

## 2018-11-15 LAB — C-REACTIVE PROTEIN: CRP: 15.3 mg/dL — ABNORMAL HIGH (ref <1.0)

## 2018-11-15 LAB — LACTATE DEHYDROGENASE: LDH: 278 U/L — ABNORMAL HIGH (ref 98–192)

## 2018-11-15 LAB — MAGNESIUM: Magnesium: 2 mg/dL (ref 1.7–2.4)

## 2018-11-15 MED ORDER — POTASSIUM CHLORIDE CRYS ER 20 MEQ PO TBCR
20.0000 meq | EXTENDED_RELEASE_TABLET | Freq: Once | ORAL | Status: AC
  Administered 2018-11-15: 20 meq via ORAL
  Filled 2018-11-15: qty 1

## 2018-11-15 NOTE — Progress Notes (Addendum)
PROGRESS NOTE    Samuel [name]  VEH:[id number] DOB: [birth year] DOA: 11/13/2018 PCP: [facility]    Brief Narrative: 63 year old with past medical history significant for diabetes type 2, multi-nodular goiter, partial thyroidectomy exam in 1998 presented with generalized fatigue present, productive cough, nausea vomiting, shortness of breath and fever.  Patient was evaluated initially in the ED on 4/18 for vomiting and nausea, treated with supportive care.  Patient present again due to worsening symptoms sent shortness of breath and cough.  Initial vitals showed BP 115/66, pulse 99, RR 20, temp 102.8 Fahrenheit, SPO2 initially 93% on room air.  SPO2 decreased to 87% on room air, he was placed on 2 L supplemental O2 via [place] with improvement in SPO2.  Labs are notable for AST 70, ALT 69, alk phos 66, T bili0.9, serum glucose 185, lactic acid 1.0.  Chemistry panel was otherwise unremarkable and CBC with differential was within normal limits.  Blood cultures were drawn and pending.  SARS-CoV-2 testing was obtained and resulted positive.  Assessment & Plan:   Principal Problem:   Acute on chronic respiratory failure with hypoxia (HCC) Active Problems:   COVID-19 virus infection   Type 2 diabetes mellitus (HCC)   1-Acute on chronic respiratory failure when he needs to hypoxemia due to COVID-19. Acute viral Pneumonitis related to covid.  Patient risk factor age: He is 63 year old, history of diabetes. He presents requiring 2 L of oxygen supplementation.  Chest x-ray show worsening bibasilar opacity left more than right. He is on Room air today.  He was a started on ceftriaxone and a azithromycin.  Continue IV antibiotics.  His Hemophagocytic  score is 52 at this point less than 1% Interleukin-6 pending. EKG normal QT.  On Lovenox for DVT prophylaxis. Continue  with vitamin C and zinc. CRP increased today to 14 from 12. LDH 281.  Procalcitonin less than 0.1. D-dimer  0.37--yesterday 0.36 Blood Cultures no growth.  Discussed with patient he is okay with being transferred to [place] if bed available Day 6 since symptoms started.  Will monitor in the hospital for 24 hours more due to increase in CRP , ferritin level. Monitor for fever.  Hemophagocytosis score HS 19 points less 1 %. Patient is not hypoxic. No need to start interleukin antagonist at this time.   2-Diabetes type 2: Continue with sliding scale insulin. 3-Transaminases; related to covid. Follow trend.    Estimated body mass index is 29.33 kg/m as calculated from the following:   Height as of this encounter: 6' (1.829 m).   Weight as of this encounter: 98.1 kg.   DVT prophylaxis: lovenox Code Status: full code Family Communication: Will update wife Disposition Plan: remain in the hospital for treatment of COVID, and monitor market of HS.    Consultants:   None   Procedures:   none   Antimicrobials:  Ceftriaxone, azithromycin.    Subjective: He report occasional cough.  He is feeling a little better today. Less weak.   Objective: Vitals:   11/14/18 0732 11/14/18 1635 11/14/18 2224 11/15/18 0700  BP: 105/69 (!) 99/57 104/62 (!) 90/50  Pulse: 88 93 91 90  Resp: 16 18    Temp: 99.3 F (37.4 C) (!) 101.9 F (38.8 C) 99.9 F (37.7 C) 98.5 F (36.9 C)  TempSrc: Oral Oral Oral Oral  SpO2: 95% 93% 91% 90%  Weight:      Height:       No intake or output data in the 24  hours ending 11/15/18 0947 Filed Weights   11/13/18 1656 11/13/18 2243  Weight: 99.7 kg 98.1 kg    Examination:  General exam: NAD Respiratory system: CTA Cardiovascular system: S 1, S 2 RRR Gastrointestinal system: BS present , soft, nt Central nervous system: NON  Focal.  Extremities: no edema    Data Reviewed: I have personally reviewed following labs and imaging studies  CBC: Recent Labs  Lab 11/10/18 0913 11/13/18 1714 11/14/18 0550 11/15/18 0426  WBC 4.7 8.0 7.8 8.4   NEUTROABS 2.8 6.9 6.7 6.7  HGB 14.1 14.1 13.4 12.5*  HCT 45.8 43.7 41.7 38.9*  MCV 84.5 83.2 82.7 83.1  PLT 204 207 188 155   Basic Metabolic Panel: Recent Labs  Lab 11/10/18 0913 11/13/18 1714 11/14/18 0550 11/15/18 0426  NA 134* 136 136 138  K 3.6 3.9 3.7 3.8  CL 97* 101 102 102  CO2 '25 24 25 27  '$ GLUCOSE 205* 185* 227* 149*  BUN '12 12 12 13  '$ CREATININE 0.97 0.81 0.81 1.03  CALCIUM 8.8* 8.4* 8.0* 8.1*  MG  --   --  1.9 2.0   GFR: Estimated Creatinine Clearance: 91.4 mL/min (by C-G formula based on SCr of 1.03 mg/dL). Liver Function Tests: Recent Labs  Lab 11/10/18 0913 11/13/18 1714 11/14/18 0550 11/15/18 0426  AST 33 78* 113* 103*  ALT 37 69* 86* 85*  ALKPHOS 67 66 68 101  BILITOT 0.9 0.9 0.8 1.1  PROT 7.2 6.9 6.4* 6.4*  ALBUMIN 3.4* 3.1* 2.7* 2.4*   Recent Labs  Lab 11/10/18 0913  LIPASE 24   No results for input(s): AMMONIA in the last 168 hours. Coagulation Profile: No results for input(s): INR, PROTIME in the last 168 hours. Cardiac Enzymes: No results for input(s): CKTOTAL, CKMB, CKMBINDEX, TROPONINI in the last 168 hours. BNP (last 3 results) No results for input(s): PROBNP in the last 8760 hours. HbA1C: Recent Labs    11/13/18 2026  HGBA1C 7.1*   CBG: Recent Labs  Lab 11/14/18 0728 11/14/18 1154 11/14/18 1632 11/14/18 2217 11/15/18 0747  GLUCAP 174* 166* 171* 158* 131*   Lipid Profile: No results for input(s): CHOL, HDL, LDLCALC, TRIG, CHOLHDL, LDLDIRECT in the last 72 hours. Thyroid Function Tests: No results for input(s): TSH, T4TOTAL, FREET4, T3FREE, THYROIDAB in the last 72 hours. Anemia Panel: Recent Labs    11/13/18 2026 11/15/18 0759  FERRITIN 509* 753*   Sepsis Labs: Recent Labs  Lab 11/13/18 1725 11/13/18 1938 11/13/18 2026  PROCALCITON  --   --  0.18  LATICACIDVEN 1.0 0.8  --     Recent Results (from the past 240 hour(s))  Blood culture (routine x 2)     Status: None (Preliminary result)   Collection Time:  11/13/18  4:59 PM  Result Value Ref Range Status   Specimen Description BLOOD LEFT ANTECUBITAL  Final   Special Requests   Final    BOTTLES DRAWN AEROBIC AND ANAEROBIC Blood Culture results may not be optimal due to an excessive volume of blood received in culture bottles   Culture   Final    NO GROWTH < 24 HOURS Performed at [facility] [address], [place], [place] [address]    Report Status PENDING  Incomplete  SARS Coronavirus 2 [facility] order, Performed in [place] hospital lab)     Status: Abnormal   Collection Time: 11/13/18  5:25 PM  Result Value Ref Range Status   SARS Coronavirus 2 POSITIVE (A) NEGATIVE Final  Comment: RESULT CALLED TO, READ BACK BY AND VERIFIED WITH: A MCKEOWN RN 1902 11/12/19 A BROWNING (NOTE) If result is NEGATIVE SARS-CoV-2 target nucleic acids are NOT DETECTED. The SARS-CoV-2 RNA is generally detectable in upper and lower  respiratory specimens during the acute phase of infection. The lowest  concentration of SARS-CoV-2 viral copies this assay can detect is 250  copies / mL. A negative result does not preclude SARS-CoV-2 infection  and should not be used as the sole basis for treatment or other  patient management decisions.  A negative result may occur with  improper specimen collection / handling, submission of specimen other  than nasopharyngeal swab, presence of viral mutation(s) within the  areas targeted by this assay, and inadequate number of viral copies  (<250 copies / mL). A negative result must be combined with clinical  observations, patient history, and epidemiological information. If result is POSITIVE SARS-CoV-2 target nucleic acids are DETECTED.  The SARS-CoV-2 RNA is generally detectable in upper and lower  respiratory specimens during the acute phase of infection.  Positive  results are indicative of active infection with SARS-CoV-2.  Clinical  correlation with patient history and other diagnostic information is   necessary to determine patient infection status.  Positive results do  not rule out bacterial infection or co-infection with other viruses. If result is PRESUMPTIVE POSTIVE SARS-CoV-2 nucleic acids MAY BE PRESENT.   A presumptive positive result was obtained on the submitted specimen  and confirmed on repeat testing.  While 2019 novel coronavirus  (SARS-CoV-2) nucleic acids may be present in the submitted sample  additional confirmatory testing may be necessary for epidemiological  and / or clinical management purposes  to differentiate between  SARS-CoV-2 and other Sarbecovirus currently known to infect humans.  If clinically indicated additional testing with an alternate test  methodology [phone]) i s advised. The SARS-CoV-2 RNA is generally  detectable in upper and lower respiratory specimens during the acute  phase of infection. The expected result is Negative. Fact Sheet for Patients:  StrictlyIdeas.no Fact Sheet for Healthcare Providers: BankingDealers.co.za This test is not yet approved or cleared by the [place] FDA and has been authorized for detection and/or diagnosis of SARS-CoV-2 by FDA under an Emergency Use Authorization (EUA).  This EUA will remain in effect (meaning this test can be used) for the duration of the COVID-19 declaration under Section 564(b)(1) of the Act, 21 U.S.C. section 360bbb-3(b)(1), unless the authorization is terminated or revoked sooner. Performed at [facility], [place] [address] [place], [place] [address]   Respiratory Panel by PCR     Status: None   Collection Time: 11/13/18  5:25 PM  Result Value Ref Range Status   Adenovirus NOT DETECTED NOT DETECTED Final   Coronavirus 229E NOT DETECTED NOT DETECTED Final    Comment: (NOTE) The Coronavirus on the Respiratory Panel, DOES NOT test for the novel  Coronavirus (2019 nCoV)    Coronavirus HKU1 NOT DETECTED NOT DETECTED Final    Coronavirus NL63 NOT DETECTED NOT DETECTED Final   Coronavirus OC43 NOT DETECTED NOT DETECTED Final   Metapneumovirus NOT DETECTED NOT DETECTED Final   Rhinovirus / Enterovirus NOT DETECTED NOT DETECTED Final   Influenza A NOT DETECTED NOT DETECTED Final   Influenza B NOT DETECTED NOT DETECTED Final   Parainfluenza Virus 1 NOT DETECTED NOT DETECTED Final   Parainfluenza Virus 2 NOT DETECTED NOT DETECTED Final   Parainfluenza Virus 3 NOT DETECTED NOT DETECTED Final   Parainfluenza Virus  4 NOT DETECTED NOT DETECTED Final   Respiratory Syncytial Virus NOT DETECTED NOT DETECTED Final   Bordetella pertussis NOT DETECTED NOT DETECTED Final   Chlamydophila pneumoniae NOT DETECTED NOT DETECTED Final   Mycoplasma pneumoniae NOT DETECTED NOT DETECTED Final    Comment: Performed at [facility], [place] [address] [place], [place] [address]  Blood culture (routine x 2)     Status: None (Preliminary result)   Collection Time: 11/13/18  7:38 PM  Result Value Ref Range Status   Specimen Description BLOOD LEFT HAND  Final   Special Requests   Final    BOTTLES DRAWN AEROBIC AND ANAEROBIC Blood Culture results may not be optimal due to an excessive volume of blood received in culture bottles   Culture   Final    NO GROWTH < 24 HOURS Performed at [facility], [place] [address], [place], [place] [address]    Report Status PENDING  Incomplete         Radiology Studies: Dg Chest Portable 1 View  Result Date: 11/13/2018 CLINICAL DATA:  Fever and cough. EXAM: PORTABLE CHEST 1 VIEW COMPARISON:  11/10/2018. FINDINGS: Reduced lung volumes compared to priors. Bibasilar opacities are increased, LEFT greater than RIGHT, is consistent with developing pneumonia, with probable superimposed subsegmental atelectasis. Cardiomegaly. IMPRESSION: Worsening aeration. Increasing bibasilar opacities, LEFT greater than RIGHT. Electronically Signed   By: Staci Righter M.D.   On: 11/13/2018 17:56         Scheduled Meds: . enoxaparin (LOVENOX) injection  40 mg Subcutaneous QHS  . insulin aspart  0-9 Units Subcutaneous TID WC  . sodium chloride flush  3 mL Intravenous Q12H  . vitamin C  500 mg Oral Daily  . zinc sulfate  220 mg Oral Daily   Continuous Infusions: . azithromycin 500 mg (11/14/18 2347)  . cefTRIAXone (ROCEPHIN)  IV 1 g (11/14/18 2221)     LOS: 2 days    Time spent: 35 minutes.     Elmarie Shiley, MD [facility] Pager [phone]  If 7PM-7AM, please contact night-coverage [email or web address] Password [facility] 11/15/2018, 9:47 AM

## 2018-11-16 LAB — GLUCOSE, CAPILLARY
Glucose-Capillary: 125 mg/dL — ABNORMAL HIGH (ref 70–99)
Glucose-Capillary: 136 mg/dL — ABNORMAL HIGH (ref 70–99)
Glucose-Capillary: 117 mg/dL — ABNORMAL HIGH (ref 70–99)

## 2018-11-16 LAB — INTERLEUKIN-6, PLASMA: Interleukin-6, Plasma: >3000 pg/mL — AB (ref 0.0–12.2)

## 2018-11-16 MED ORDER — ALBUTEROL SULFATE HFA 108 (90 BASE) MCG/ACT IN AERS: 2.0000 | INHALATION_SPRAY | Freq: Four times a day (QID) | RESPIRATORY_TRACT | 0 refills | Status: DC | PRN

## 2018-11-16 MED ORDER — AZITHROMYCIN 250 MG PO TABS: 250.0000 mg | ORAL_TABLET | Freq: Every day | ORAL | 0 refills | Status: DC

## 2018-11-16 NOTE — Discharge Summary (Signed)
Samuel [name] VKP:[id number] DOB: 1956-03-22 DOA: 11/13/2018  PCP: [facility]  Admit date: 11/13/2018  Discharge date: 11/16/2018  Admitted From: Home  Disposition:  Home   Recommendations for Outpatient Follow-up:   Follow up with PCP in 1-2 weeks  PCP Please obtain BMP/CBC, 2 view CXR in 1week,  (see Discharge instructions)   PCP Please follow up on the following pending results:    Home Health: None   Equipment/Devices: None  Consultations: None Discharge Condition: Stable   CODE STATUS: Full   Diet Recommendation: Heart Healthy Low Carb   Chief Complaint  Patient presents with  . Fever     Brief history of present illness from the day of admission and additional interim summary    63 year old with past medical history significant for diabetes type 2, multi-nodular goiter, partial thyroidectomy exam in 1998 presented with generalized fatigue present, productive cough, nausea vomiting, shortness of breath and fever for 1 week, was diagnosed with Covid 19.                                                                 Hospital Course    1.  Acute hypoxic respiratory failure due to COVID-19 pneumonia.  He was placed on azithromycin along with Rocephin at the previous hospital, at this time with supportive care he is back to room air, no symptoms, feels back to baseline eager to go home, will finish azithromycin course in the outpatient setting.  Currently on room air will be discharged with home isolation orders given in writing, follow with PCP in a week.  Note he is currently on room air and in no respiratory distress whatsoever.  Medical problems of DM type II are stable, he will continue his home regimen unchanged and follow with PCP for glycemic control.   Discharge diagnosis      Principal Problem:   Acute on chronic respiratory failure with hypoxia (HCC) Active Problems:   COVID-19 virus infection   Type 2 diabetes mellitus ([place])    Discharge instructions    Discharge Instructions    Diet - low sodium heart healthy   Complete by:  As directed    Discharge instructions   Complete by:  As directed    If your shortness of breath or fever get worse call 911 and come to the ER immediately.    Follow with [facility] [place] in 7 days   Get CBC, CMP, 2 view Chest X ray -  checked  by Primary MD  in 5-7 days    Activity: As tolerated with Full fall precautions use walker/cane & assistance as needed  Disposition Home    Diet: Heart Healthy Low Carb  Special Instructions: If you have smoked or chewed Tobacco  in the last 2 yrs please  stop smoking, stop any regular Alcohol  and or any Recreational drug use.  On your next visit with your primary care physician please Get Medicines reviewed and adjusted.  Please request your Prim.MD to go over all Hospital Tests and Procedure/Radiological results at the follow up, please get all Hospital records sent to your Prim MD by signing hospital release before you go home.  If you experience worsening of your admission symptoms, develop shortness of breath, life threatening emergency, suicidal or homicidal thoughts you must seek medical attention immediately by calling 911 or calling your MD immediately  if symptoms less severe.  You Must read complete instructions/literature along with all the possible adverse reactions/side effects for all the Medicines you take and that have been prescribed to you. Take any new Medicines after you have completely understood and accpet all the possible adverse reactions/side effects.   Increase activity slowly   Complete by:  As directed    MyChart COVID-19 home monitoring program   Complete by:  Nov 16, 2018    Is the patient willing to use a smartphone for remote  monitoring via the MyChart app?:  Yes   Temperature monitoring   Complete by:  Nov 16, 2018    After how many days would you like to receive a notification of this patient's flowsheet entries?:  1      Discharge Medications   Allergies as of 11/16/2018   No Known Allergies     Medication List    STOP taking these medications   amoxicillin 500 MG tablet Commonly known as:  AMOXIL   celecoxib 100 MG capsule Commonly known as:  CeleBREX     TAKE these medications   albuterol 108 (90 Base) MCG/ACT inhaler Commonly known as:  VENTOLIN HFA Inhale 2 puffs into the lungs every 6 (six) hours as needed for wheezing or shortness of breath.   azithromycin 250 MG tablet Commonly known as:  Zithromax Take 1 tablet (250 mg total) by mouth daily.   blood glucose meter kit and supplies Kit Check sugars twice daily, in morning when fasting and in evening before dinner   etodolac 400 MG tablet Commonly known as:  LODINE Take 1 tablet (400 mg total) by mouth 2 (two) times daily as needed.   famotidine 20 MG tablet Commonly known as:  PEPCID Take 1 tablet (20 mg total) by mouth 2 (two) times daily.   glucose blood test strip Use as instructed   metFORMIN 500 MG 24 hr tablet Commonly known as:  GLUCOPHAGE-XR 1 tab by mouth daily with dinner   ondansetron 4 MG disintegrating tablet Commonly known as:  ZOFRAN-ODT Take 1 tablet (4 mg total) by mouth every 8 (eight) hours as needed for nausea or vomiting.   traMADol 50 MG tablet Commonly known as:  ULTRAM Take 1 tablet (50 mg total) by mouth every 6 (six) hours as needed.       Follow-up [place]. Schedule an appointment as soon as possible for a visit in 1 week(s).   Contact information: [place] [phone] [phone]           Major procedures and Radiology Reports - PLEASE review detailed and final reports thoroughly  -       Dg Chest Portable 1 View  Result  Date: 11/13/2018 CLINICAL DATA:  Fever and cough. EXAM: PORTABLE CHEST 1 VIEW COMPARISON:  11/10/2018. FINDINGS: Reduced lung volumes compared to priors. Bibasilar opacities are  increased, LEFT greater than RIGHT, is consistent with developing pneumonia, with probable superimposed subsegmental atelectasis. Cardiomegaly. IMPRESSION: Worsening aeration. Increasing bibasilar opacities, LEFT greater than RIGHT. Electronically Signed   By: Staci Righter M.D.   On: 11/13/2018 17:56   Dg Chest Portable 1 View  Result Date: 11/10/2018 CLINICAL DATA:  Body aches and chills. EXAM: PORTABLE CHEST 1 VIEW COMPARISON:  September 09, 2018 FINDINGS: Mild opacity persists in the left base. The cardiomediastinal silhouette is stable. No pneumothorax. No other acute abnormalities. IMPRESSION: Mild persistent opacity in left base may simply represent atelectasis or scar. Mild infiltrate not excluded. Recommend short-term follow-up to ensure resolution. Electronically Signed   By: Dorise Bullion III M.D   On: 11/10/2018 09:24    Micro Results     Recent Results (from the past 240 hour(s))  Blood culture (routine x 2)     Status: None (Preliminary result)   Collection Time: 11/13/18  4:59 PM  Result Value Ref Range Status   Specimen Description BLOOD LEFT ANTECUBITAL  Final   Special Requests   Final    BOTTLES DRAWN AEROBIC AND ANAEROBIC Blood Culture results may not be optimal due to an excessive volume of blood received in culture bottles   Culture   Final    NO GROWTH 3 DAYS Performed at [facility] [address] [place], [place] [address]    Report Status PENDING  Incomplete  SARS Coronavirus 2 [facility] order, Performed in [place] hospital lab)     Status: Abnormal   Collection Time: 11/13/18  5:25 PM  Result Value Ref Range Status   SARS Coronavirus 2 POSITIVE (A) NEGATIVE Final    Comment: RESULT CALLED TO, READ BACK BY AND VERIFIED WITH: A MCKEOWN RN 1902 11/12/19 A BROWNING (NOTE) If  result is NEGATIVE SARS-CoV-2 target nucleic acids are NOT DETECTED. The SARS-CoV-2 RNA is generally detectable in upper and lower  respiratory specimens during the acute phase of infection. The lowest  concentration of SARS-CoV-2 viral copies this assay can detect is 250  copies / mL. A negative result does not preclude SARS-CoV-2 infection  and should not be used as the sole basis for treatment or other  patient management decisions.  A negative result may occur with  improper specimen collection / handling, submission of specimen other  than nasopharyngeal swab, presence of viral mutation(s) within the  areas targeted by this assay, and inadequate number of viral copies  (<250 copies / mL). A negative result must be combined with clinical  observations, patient history, and epidemiological information. If result is POSITIVE SARS-CoV-2 target nucleic acids are DETECTED.  The SARS-CoV-2 RNA is generally detectable in upper and lower  respiratory specimens during the acute phase of infection.  Positive  results are indicative of active infection with SARS-CoV-2.  Clinical  correlation with patient history and other diagnostic information is  necessary to determine patient infection status.  Positive results do  not rule out bacterial infection or co-infection with other viruses. If result is PRESUMPTIVE POSTIVE SARS-CoV-2 nucleic acids MAY BE PRESENT.   A presumptive positive result was obtained on the submitted specimen  and confirmed on repeat testing.  While 2019 novel coronavirus  (SARS-CoV-2) nucleic acids may be present in the submitted sample  additional confirmatory testing may be necessary for epidemiological  and / or clinical management purposes  to differentiate between  SARS-CoV-2 and other Sarbecovirus currently known to infect humans.  If clinically indicated additional testing with an alternate  test  methodology [phone]) i s advised. The SARS-CoV-2 RNA is generally   detectable in upper and lower respiratory specimens during the acute  phase of infection. The expected result is Negative. Fact Sheet for Patients:  StrictlyIdeas.no Fact Sheet for Healthcare Providers: BankingDealers.co.za This test is not yet approved or cleared by the [place] FDA and has been authorized for detection and/or diagnosis of SARS-CoV-2 by FDA under an Emergency Use Authorization (EUA).  This EUA will remain in effect (meaning this test can be used) for the duration of the COVID-19 declaration under Section 564(b)(1) of the Act, 21 U.S.C. section 360bbb-3(b)(1), unless the authorization is terminated or revoked sooner. Performed at [facility], [place] [address], [place], [place] [address]   Respiratory Panel by PCR     Status: None   Collection Time: 11/13/18  5:25 PM  Result Value Ref Range Status   Adenovirus NOT DETECTED NOT DETECTED Final   Coronavirus 229E NOT DETECTED NOT DETECTED Final    Comment: (NOTE) The Coronavirus on the Respiratory Panel, DOES NOT test for the novel  Coronavirus (2019 nCoV)    Coronavirus HKU1 NOT DETECTED NOT DETECTED Final   Coronavirus NL63 NOT DETECTED NOT DETECTED Final   Coronavirus OC43 NOT DETECTED NOT DETECTED Final   Metapneumovirus NOT DETECTED NOT DETECTED Final   Rhinovirus / Enterovirus NOT DETECTED NOT DETECTED Final   Influenza A NOT DETECTED NOT DETECTED Final   Influenza B NOT DETECTED NOT DETECTED Final   Parainfluenza Virus 1 NOT DETECTED NOT DETECTED Final   Parainfluenza Virus 2 NOT DETECTED NOT DETECTED Final   Parainfluenza Virus 3 NOT DETECTED NOT DETECTED Final   Parainfluenza Virus 4 NOT DETECTED NOT DETECTED Final   Respiratory Syncytial Virus NOT DETECTED NOT DETECTED Final   Bordetella pertussis NOT DETECTED NOT DETECTED Final   Chlamydophila pneumoniae NOT DETECTED NOT DETECTED Final   Mycoplasma pneumoniae NOT DETECTED NOT DETECTED Final     Comment: Performed at [facility], [place]. [address], [place], [place] [address]  Blood culture (routine x 2)     Status: None (Preliminary result)   Collection Time: 11/13/18  7:38 PM  Result Value Ref Range Status   Specimen Description BLOOD LEFT HAND  Final   Special Requests   Final    BOTTLES DRAWN AEROBIC AND ANAEROBIC Blood Culture results may not be optimal due to an excessive volume of blood received in culture bottles   Culture   Final    NO GROWTH 3 DAYS Performed at [facility], [place] [address], [place], [place] [address]    Report Status PENDING  Incomplete    Today   Subjective    Ranny Wiebelhaus today has no headache,no chest abdominal pain,no new weakness tingling or numbness, feels much better wants to go home today.     Objective   Blood pressure 116/81, pulse 80, temperature 98.3 F (36.8 C), temperature source Oral, resp. rate 18, height 6' (1.829 m), weight 98.1 kg, SpO2 94 % on RA.   Intake/Output Summary (Last 24 hours) at 11/16/2018 1021 Last data filed at 11/15/2018 1830 Gross per 24 hour  Intake 480 ml  Output -  Net 480 ml    Exam  Awake Alert, Oriented x 3, No new F.N deficits, Normal affect [place].AT,PERRAL Supple Neck,No JVD, No cervical lymphadenopathy appriciated.  Symmetrical Chest wall movement, Good air movement bilaterally, CTAB RRR,No Gallops,Rubs or new Murmurs, No Parasternal Heave +ve B.Sounds, Abd Soft, Non tender,  No organomegaly appriciated, No rebound -guarding or rigidity. No Cyanosis, Clubbing or edema, No new Rash or bruise   Data Review   CBC w Diff:  Lab Results  Component Value Date   WBC 8.4 11/15/2018   HGB 12.5 (L) 11/15/2018   HGB 14.3 11/13/2018   HCT 38.9 (L) 11/15/2018   PLT 232 11/15/2018   LYMPHOPCT 14 11/15/2018   MONOPCT 6 11/15/2018   EOSPCT 0 11/15/2018   BASOPCT 0 11/15/2018    CMP:  Lab Results  Component Value Date   NA 138 11/15/2018   NA 139 10/07/2015   K 3.8 11/15/2018   CL 102  11/15/2018   CO2 27 11/15/2018   BUN 13 11/15/2018   BUN 16 10/07/2015   CREATININE 1.03 11/15/2018   CREATININE 0.73 03/12/2014   PROT 6.4 (L) 11/15/2018   PROT 6.9 10/07/2015   ALBUMIN 2.4 (L) 11/15/2018   ALBUMIN 3.8 10/07/2015   BILITOT 1.1 11/15/2018   BILITOT 0.4 10/07/2015   ALKPHOS 101 11/15/2018   AST 103 (H) 11/15/2018   ALT 85 (H) 11/15/2018  .   Total Time in preparing paper work, data evaluation and todays exam - 27 minutes  Lala Lund M.D on 11/16/2018 at 10:21 AM  [facility]   Office  [phone]

## 2018-11-16 NOTE — Discharge Instructions (Signed)
If your shortness of breath or fever get worse call 911 and come to the ER immediately.    Follow with [facility] [place] in 7 days   Get CBC, CMP, 2 view Chest X ray -  checked  by Primary MD  in 5-7 days    Activity: As tolerated with Full fall precautions use walker/cane & assistance as needed  Disposition Home    Diet: Heart Healthy Low Carb  Special Instructions: If you have smoked or chewed Tobacco  in the last 2 yrs please stop smoking, stop any regular Alcohol  and or any Recreational drug use.  On your next visit with your primary care physician please Get Medicines reviewed and adjusted.  Please request your Prim.MD to go over all Hospital Tests and Procedure/Radiological results at the follow up, please get all Hospital records sent to your Prim MD by signing hospital release before you go home.  If you experience worsening of your admission symptoms, develop shortness of breath, life threatening emergency, suicidal or homicidal thoughts you must seek medical attention immediately by calling 911 or calling your MD immediately  if symptoms less severe.  You Must read complete instructions/literature along with all the possible adverse reactions/side effects for all the Medicines you take and that have been prescribed to you. Take any new Medicines after you have completely understood and accpet all the possible adverse reactions/side effects.        Person Under Monitoring Name: Samuel [name]  Location: [address] [place] [address]   CORONAVIRUS DISEASE 2019 (COVID-19) Guidance for Persons Under Investigation You are being tested for the virus that causes coronavirus disease 2019 (COVID-19). Public health actions are necessary to ensure protection of your health and the health of others, and to prevent further spread of infection. COVID-19 is caused by a virus that can cause symptoms, such as fever, cough, and shortness of breath. The primary  transmission from person to person is by coughing or sneezing. On August 23, 2018, the [place] announced a [employer] Emergency of International Concern and on August 24, 2018 the U.S. Department of Health and Human Services declared a public health emergency. If the virus that causesCOVID-19 spreads in the community, it could have severe public health consequences.  As a person under investigation for COVID-19, the [place] advises you to adhere to the following guidance until your test results are reported to you. If your test result is positive, you will receive additional information from your provider and your local health department at that time.   Remain at home until you are cleared by your health provider or public health authorities.   Keep a log of visitors to your home using the form provided. Any visitors to your home must be aware of your isolation status.  If you plan to move to a new address or leave the county, notify the local health department in your county.  Call a doctor or seek care if you have an urgent medical need. Before seeking medical care, call ahead and get instructions from the provider before arriving at the medical office, clinic or hospital. Notify them that you are being tested for the virus that causes COVID-19 so arrangements can be made, as necessary, to prevent transmission to others in the healthcare setting. Next, notify the local health department in your county.  If a medical emergency arises and you need to call  911, inform the first responders that you are being tested for the virus that causes COVID-19. Next, notify the local health department in your county.  Adhere to all guidance set forth by the [place] for [facility] of patients that is based on guidance from the Center for Disease Control and Prevention with suspected or  confirmed COVID-19. It is provided with this guidance for Persons Under Investigation.  Your health and the health of our community are our top priorities. Public Health officials remain available to provide assistance and counseling to you about COVID-19 and compliance with this guidance.  Provider: ____________________________________________________________ Date: ______/_____/_________  By signing below, you acknowledge that you have read and agree to comply with this Guidance for Persons Under Investigation. ______________________________________________________________ Date: ______/_____/_________  WHO DO I CALL? You can find a list of local health departments here: [email or web address] Health Department: ____________________________________________________________________ Contact Name: ________________________________________________________________________ Telephone: ___________________________________________________________________________  Marice Potter, [place], Communicable Disease Branch COVID-19 Guidance for Persons Under Investigation September 29, 2018

## 2018-11-16 NOTE — Progress Notes (Signed)
Patient started to desat, o2sats in 80's after coughing spell and using the bathroom. Oxygen increased to 6L from 2L . sats now at 96-97 % . Patient appears non-labored with no signs of respiratory distress. Covering HSP paged via amion. I will continue to monitor patient as the shift continues

## 2018-11-18 LAB — CULTURE, BLOOD (ROUTINE X 2)
Culture: NO GROWTH
Culture: NO GROWTH

## 2018-11-20 ENCOUNTER — Telehealth (INDEPENDENT_AMBULATORY_CARE_PROVIDER_SITE_OTHER): Payer: Self-pay

## 2018-11-20 NOTE — Telephone Encounter (Signed)
LM for pt that I have texted mychart link to cell #. Requesting pt sign up, log in, and complete MyChart COVID19 Home Monitoring questionnaire. Ok to transfer to me if pt calls in [phone].

## 2018-11-22 ENCOUNTER — Ambulatory Visit: Payer: Self-pay

## 2018-11-22 NOTE — Telephone Encounter (Signed)
Pt called stating that he was released from the [facility] facility after spending several days sick with COVID-19. He was instructed to follow up with his PCP [facility] center.  He is upset because they said they would call him back and would not allow him in the office. I explained that he needed to call them back and that it was my understanding that all office were limiting access. I explained the virtual visit and telephone visits and the need for them with the distancing ordered. Pt was offered a new patient appointment with one of our offices but declined. He was encouraged to call the [facility] center office again for follow up. It was stressed that his follow up is important for his full recovery. Pt verbalized understanding and will call back if he does not get satisfaction from his PCP.  Reason for Disposition . Requesting regular office appointment    Pt is seen at [facility]. A transfer of care appointment offered to patient. Pt will call back if needed.  Answer Assessment - Initial Assessment Questions 1. REASON FOR CALL or QUESTION: "What is your reason for calling today?" or "How can I best help you?" or "What question do you have that I can help answer?"     Pt called stating that he was released from the [facility] facility after spending several days sick. He was instructed to follow up with his PCP [facility] center.  He is upset because they said they would call him back and would not allow him in the office  Protocols used: INFORMATION ONLY CALL-A-AH

## 2018-11-26 ENCOUNTER — Ambulatory Visit: Payer: Self-pay | Admitting: *Deleted

## 2018-11-26 NOTE — Telephone Encounter (Signed)
Pt calling with questions related to self isolation. Positive Covid-19 11/13/2018. States asymptomatic presently except "Felt very hot inside last night, took medicine and went away." States felt feverish, does not have thermometer. Reviewed criteria with patient. Today is day #13 of self isolation.States he will have his son purchase thermometer and monitor temp.. Made aware needed to be afebrile for 3 consecutive  days without medication to reduce fever, symptom free for 7 days which pt states he is. Advised to CB if any further questions arise. Pt verbalizes understanding of instructions.    Reason for Disposition . COVID-19 Home Isolation, questions about  Answer Assessment - Initial Assessment Questions 1. COVID-19 DIAGNOSIS: "Who made your Coronavirus (COVID-19) diagnosis?" "Was it confirmed by a positive lab test?" If not diagnosed by a HCP, ask "Are there lots of cases (community spread) where you live?" (See public health department website, if unsure)   * MAJOR community spread: high number of cases; numbers of cases are increasing; many people hospitalized.   * MINOR community spread: low number of cases; not increasing; few or no people hospitalized     *No Answer* 2. ONSET: "When did the COVID-19 symptoms start?"      *No Answer* 3. WORST SYMPTOM: "What is your worst symptom?" (e.g., cough, fever, shortness of breath, muscle aches)     *No Answer* 4. COUGH: "Do you have a cough?" If so, ask: "How bad is the cough?"       *No Answer* 5. FEVER: "Do you have a fever?" If so, ask: "What is your temperature, how was it measured, and when did it start?"     *No Answer* 6. RESPIRATORY STATUS: "Describe your breathing?" (e.g., shortness of breath, wheezing, unable to speak)      *No Answer* 7. BETTER-SAME-WORSE: "Are you getting better, staying the same or getting worse compared to yesterday?"  If getting worse, ask, "In what way?"     *No Answer* 8. HIGH RISK DISEASE: "Do you have any  chronic medical problems?" (e.g., asthma, heart or lung disease, weak immune system, etc.)     *No Answer* 9. PREGNANCY: "Is there any chance you are pregnant?" "When was your last menstrual period?"     *No Answer* 10. OTHER SYMPTOMS: "Do you have any other symptoms?"  (e.g., runny nose, headache, sore throat, loss of smell)       *No Answer*  Protocols used: CORONAVIRUS (COVID-19) DIAGNOSED OR SUSPECTED-A-AH

## 2018-11-27 NOTE — Telephone Encounter (Signed)
Called and spoke with pt. Re-texted link to sign up for MyChart mobile app. Pt states he should be able to sign up. Advised pt to complete [place] Monitoring questionnaire daily.

## 2018-12-20 ENCOUNTER — Telehealth: Payer: Self-pay | Admitting: *Deleted

## 2018-12-20 NOTE — Telephone Encounter (Signed)
I called and asked if he would be willing to donate his plasma since he was recovered from the COVID-19.   I let him know his antibodies in the plasma would be given to someone recovering from the virus to assist in their recovery.   He was willing to donate.   I gave him the web site [facility] with instructions on what to do.   After he submits his application someone would be in contact with him regarding the donation.  I thanked him for being willing to donate his plasma in order to help others recover.

## 2018-12-24 ENCOUNTER — Encounter (HOSPITAL_COMMUNITY): Payer: Self-pay

## 2018-12-24 ENCOUNTER — Emergency Department (HOSPITAL_COMMUNITY)
Admission: EM | Admit: 2018-12-24 | Discharge: 2018-12-24 | Disposition: A | Discharge status: Home / Self Care | Payer: Medicaid Other | Attending: Emergency Medicine | Admitting: Emergency Medicine

## 2018-12-24 ENCOUNTER — Emergency Department (HOSPITAL_COMMUNITY): Payer: Medicaid Other

## 2018-12-24 ENCOUNTER — Other Ambulatory Visit: Payer: Self-pay

## 2018-12-24 DIAGNOSIS — R0789 Other chest pain: Secondary | ICD-10-CM | POA: Diagnosis not present

## 2018-12-24 DIAGNOSIS — E119 Type 2 diabetes mellitus without complications: Secondary | ICD-10-CM | POA: Diagnosis not present

## 2018-12-24 DIAGNOSIS — R531 Weakness: Secondary | ICD-10-CM | POA: Diagnosis not present

## 2018-12-24 DIAGNOSIS — Z79899 Other long term (current) drug therapy: Secondary | ICD-10-CM | POA: Diagnosis not present

## 2018-12-24 DIAGNOSIS — M545 Low back pain, unspecified: Secondary | ICD-10-CM

## 2018-12-24 DIAGNOSIS — R002 Palpitations: Secondary | ICD-10-CM

## 2018-12-24 LAB — COMPREHENSIVE METABOLIC PANEL
ALT: 37 U/L (ref 0–44)
AST: 82 U/L — ABNORMAL HIGH (ref 15–41)
Albumin: 4.1 g/dL (ref 3.5–5.0)
Alkaline Phosphatase: 127 U/L — ABNORMAL HIGH (ref 38–126)
Anion gap: 13 (ref 5–15)
BUN: 10 mg/dL (ref 8–23)
CO2: 23 mmol/L (ref 22–32)
Calcium: 8.2 mg/dL — ABNORMAL LOW (ref 8.9–10.3)
Chloride: 104 mmol/L (ref 98–111)
Creatinine, Ser: 0.92 mg/dL (ref 0.61–1.24)
GFR calc Af Amer: >60 mL/min (ref >60)
GFR calc non Af Amer: >60 mL/min (ref >60)
Glucose, Bld: 110 mg/dL — ABNORMAL HIGH (ref 70–99)
Potassium: 4.8 mmol/L (ref 3.5–5.1)
Sodium: 140 mmol/L (ref 135–145)
Total Bilirubin: 0.7 mg/dL (ref 0.3–1.2)
Total Protein: 7.1 g/dL (ref 6.5–8.1)

## 2018-12-24 LAB — URINALYSIS, ROUTINE W REFLEX MICROSCOPIC
Bilirubin Urine: NEGATIVE
Glucose, UA: NEGATIVE mg/dL
Hgb urine dipstick: NEGATIVE
Ketones, ur: NEGATIVE mg/dL
Leukocytes,Ua: NEGATIVE
Nitrite: NEGATIVE
Protein, ur: NEGATIVE mg/dL
Specific Gravity, Urine: 1.021 (ref 1.005–1.030)
pH: 5 (ref 5.0–8.0)

## 2018-12-24 LAB — CBC WITH DIFFERENTIAL/PLATELET
Abs Immature Granulocytes: 0.01 10*3/uL (ref 0.00–0.07)
Basophils Absolute: 0 10*3/uL (ref 0.0–0.1)
Basophils Relative: 1 %
Eosinophils Absolute: 0.1 10*3/uL (ref 0.0–0.5)
Eosinophils Relative: 3 %
HCT: 42.1 % (ref 39.0–52.0)
Hemoglobin: 13.1 g/dL (ref 13.0–17.0)
Immature Granulocytes: 0 %
Lymphocytes Relative: 47 %
Lymphs Abs: 2.2 10*3/uL (ref 0.7–4.0)
MCH: 26.7 pg (ref 26.0–34.0)
MCHC: 31.1 g/dL (ref 30.0–36.0)
MCV: 85.9 fL (ref 80.0–100.0)
Monocytes Absolute: 0.6 10*3/uL (ref 0.1–1.0)
Monocytes Relative: 13 %
Neutro Abs: 1.7 10*3/uL (ref 1.7–7.7)
Neutrophils Relative %: 36 %
Platelets: 224 10*3/uL (ref 150–400)
RBC: 4.9 MIL/uL (ref 4.22–5.81)
RDW: 14.1 % (ref 11.5–15.5)
WBC: 4.7 10*3/uL (ref 4.0–10.5)
nRBC: 0 % (ref 0.0–0.2)

## 2018-12-24 LAB — SEDIMENTATION RATE: Sed Rate: 7 mm/hr (ref 0–16)

## 2018-12-24 LAB — TROPONIN I: Troponin I: <0.03 ng/mL (ref <0.03)

## 2018-12-24 NOTE — ED Notes (Signed)
Snack given.

## 2018-12-24 NOTE — ED Notes (Signed)
Patient transported to X-ray 

## 2018-12-24 NOTE — ED Triage Notes (Signed)
Patient complains of weakness and abnormal heart rhythm getting high, than low.  He reports being covid + on 11/17/2018

## 2018-12-24 NOTE — ED Notes (Signed)
Urine sample requested.

## 2018-12-24 NOTE — ED Provider Notes (Signed)
MSE was initiated and I personally evaluated the patient and placed orders (if any) at  11:06 AM on December 24, 2018.  The patient appears stable so that the remainder of the MSE may be completed by another provider.  He reports that he has been having palpitations in his chest with intermittent tightness.  This has been going on for a week.  He reports no pain right now in his chest.  His primary complaint is pain in his back.  Unable to recreate pain with palpation.    Lorin Glass, Hershal Coria 12/24/18 Onaway, Ankit, MD 12/24/18 1910

## 2018-12-24 NOTE — ED Notes (Signed)
Changing into clothes, IV removed, waiting on DC paperwork

## 2018-12-24 NOTE — ED Provider Notes (Signed)
[facility] EMERGENCY DEPARTMENT Provider Note   CSN: [id number] Arrival date & time: 12/24/18  7106    History   Chief Complaint Chief Complaint  Patient presents with  . Weakness  . Tachycardia    HPI Aidynn Krenn is a 63 y.o. male.     HPI 63 year old male with history of diabetes, recent admission for COVID-19 respiratory failure comes with chief complaint of back pain, weakness and palpitations.  He reports that over the past few days he has been having mild chest discomfort and palpitations.  Symptoms are typically unprovoked.  He is also been for feeling weak and having lower back pain.  The back pain is provoked when he tries to walk and he also appreciates pain in his leg.  He has no associated lower extremity weakness, numbness, tingling and denies any urinary incontinence, urinary retention or saddle anesthesia.  There is no history of similar symptoms in the past.  Denies any active UTI-like symptoms.  Past Medical History:  Diagnosis Date  . Arthritis    Knees  . Diabetes mellitus without complication ([place])   . Goiter    Multinodular Goiter with partial thyroidectomey in [place]  1998.  Ultrasound confirming diagnosis in 01/17/2014    Patient Active Problem List   Diagnosis Date Noted  . COVID-19 virus infection 11/13/2018  . Acute on chronic respiratory failure with hypoxia ([place]) 11/13/2018  . Type 2 diabetes mellitus ([place]) 11/13/2018  . Unspecified vitamin D deficiency 03/20/2014  . Dental cavities 03/20/2014  . IFG (impaired fasting glucose) 02/26/2014  . Multinodular goiter 02/26/2014  . Goiter     Past Surgical History:  Procedure Laterality Date  . APPENDECTOMY  2014  . THYROID SURGERY  1998   Enlarged and painful thyroid        Home Medications    Prior to Admission medications   Medication Sig Start Date End Date Taking? Authorizing Provider  albuterol (VENTOLIN HFA) 108 (90 Base) MCG/ACT inhaler Inhale 2 puffs into the  lungs every 6 (six) hours as needed for wheezing or shortness of breath. 11/16/18   Thurnell Lose, MD  azithromycin (ZITHROMAX) 250 MG tablet Take 1 tablet (250 mg total) by mouth daily. 11/16/18   Thurnell Lose, MD  blood glucose meter kit and supplies KIT Check sugars twice daily, in morning when fasting and in evening before dinner 02/08/16   Mack Hook, MD  etodolac (LODINE) 400 MG tablet Take 1 tablet (400 mg total) by mouth 2 (two) times daily as needed. Patient not taking: Reported on 11/13/2018 08/13/18   Hilts, Legrand Como, MD  famotidine (PEPCID) 20 MG tablet Take 1 tablet (20 mg total) by mouth 2 (two) times daily. Patient not taking: Reported on 11/13/2018 09/09/18   Margarita Mail, PA-C  glucose blood test strip Use as instructed Patient not taking: Reported on 10/31/2017 02/08/16   Mack Hook, MD  metFORMIN (GLUCOPHAGE-XR) 500 MG 24 hr tablet 1 tab by mouth daily with dinner Patient not taking: Reported on 08/13/2018 02/08/16   Mack Hook, MD  ondansetron (ZOFRAN-ODT) 4 MG disintegrating tablet Take 1 tablet (4 mg total) by mouth every 8 (eight) hours as needed for nausea or vomiting. Patient not taking: Reported on 11/13/2018 11/10/18   Davonna Belling, MD  traMADol (ULTRAM) 50 MG tablet Take 1 tablet (50 mg total) by mouth every 6 (six) hours as needed. Patient not taking: Reported on 11/13/2018 08/05/18   Orvan July, NP    Family History Family  History  Problem Relation Age of Onset  . Arthritis Sister   . Arthritis Brother   . Prostate cancer Brother     Social History Social History   Tobacco Use  . Smoking status: Never Smoker  . Smokeless tobacco: Never Used  Substance Use Topics  . Alcohol use: No    Alcohol/week: 0.0 standard drinks  . Drug use: No     Allergies   Patient has no known allergies.   Review of Systems Review of Systems  Constitutional: Positive for activity change. Negative for fever.  Respiratory: Negative for  shortness of breath.   Cardiovascular: Negative for chest pain.  Genitourinary: Negative for dysuria.  Musculoskeletal: Positive for arthralgias and myalgias.  Neurological: Negative for headaches.  All other systems reviewed and are negative.    Physical Exam Updated Vital Signs BP 110/71   Pulse 85   Temp 98.7 F (37.1 C) (Oral)   Resp 15   Ht 6' (1.829 m)   Wt 99.8 kg   SpO2 95%   BMI 29.84 kg/m   Physical Exam Vitals signs and nursing note reviewed.  Constitutional:      Appearance: He is well-developed.  HENT:     Head: Normocephalic and atraumatic.  Eyes:     Conjunctiva/sclera: Conjunctivae normal.     Pupils: Pupils are equal, round, and reactive to light.  Neck:     Musculoskeletal: Normal range of motion and neck supple.  Cardiovascular:     Rate and Rhythm: Normal rate and regular rhythm.  Pulmonary:     Effort: Pulmonary effort is normal.     Breath sounds: Normal breath sounds.  Abdominal:     General: Bowel sounds are normal. There is no distension.     Palpations: Abdomen is soft. There is no mass.     Tenderness: There is no abdominal tenderness. There is no guarding or rebound.  Musculoskeletal:        General: No deformity.     Comments: Pt has tenderness over the lumbar region -mostly paraspinal. No step offs, no erythema. Pt has 2+ patellar reflex bilaterally. Able to discriminate between sharp and dull. Able to ambulate   Skin:    General: Skin is warm.  Neurological:     Mental Status: He is alert and oriented to person, place, and time.      ED Treatments / Results  Labs (all labs ordered are listed, but only abnormal results are displayed) Labs Reviewed  COMPREHENSIVE METABOLIC PANEL - Abnormal; Notable for the following components:      Result Value   Glucose, Bld 110 (*)    Calcium 8.2 (*)    AST 82 (*)    Alkaline Phosphatase 127 (*)    All other components within normal limits  CBC WITH DIFFERENTIAL/PLATELET  TROPONIN I   URINALYSIS, ROUTINE W REFLEX MICROSCOPIC  SEDIMENTATION RATE    EKG EKG Interpretation  Date/Time:  Monday December 24 2018 09:40:45 EDT Ventricular Rate:  75 PR Interval:    QRS Duration: 86 QT Interval:  362 QTC Calculation: 405 R Axis:   32 Text Interpretation:  Sinus rhythm Low voltage, precordial leads Anteroseptal infarct, old No acute changes Confirmed by Varney Biles [phone]) on 12/24/2018 11:12:31 AM   Radiology Dg Lumbar Spine Complete  Result Date: 12/24/2018 CLINICAL DATA:  Lower back pain. EXAM: LUMBAR SPINE - COMPLETE 4+ VIEW COMPARISON:  Abdominal and pelvic CT 03/02/2012 FINDINGS: Normal alignment of the lumbar spine. Disc space narrowing and endplate  changes at L5-S1 are chronic. The other disc spaces are maintained. Vertebral body heights are maintained. Negative for pars defect. IMPRESSION: No acute abnormality to the lumbar spine. Chronic disc space narrowing at L5-S1. Electronically Signed   By: Markus Daft M.D.   On: 12/24/2018 12:25   Dg Chest Port 1 View  Result Date: 12/24/2018 CLINICAL DATA:  Cough.  Recent COVID-19 infection EXAM: PORTABLE CHEST 1 VIEW COMPARISON:  November 13, 2018 FINDINGS: There is a small left pleural effusion with atelectatic change in the left base. There is been interval clearing of infiltrate from the left base compared to most recent study. Lungs elsewhere are clear. Heart is upper normal in size with pulmonary vascularity normal. No adenopathy. No bone lesions. IMPRESSION: Small left pleural effusion with left base atelectasis. Lungs elsewhere clear. Heart upper normal in size. No evident adenopathy. Electronically Signed   By: Lowella Grip III M.D.   On: 12/24/2018 10:53    Procedures Procedures (including critical care time)  Medications Ordered in ED Medications - No data to display   Initial Impression / Assessment and Plan / ED Course  I have reviewed the triage vital signs and the nursing notes.  Pertinent labs & imaging  results that were available during my care of the patient were reviewed by me and considered in my medical decision making (see chart for details).        63 year old male with history of diabetes and recent COVID-19 infection comes in a chief complaint of weakness, back pain, chest discomfort.  Chest pain is nonspecific.  It does not sound ischemic in nature.  EKG is reassuring.  Initial troponin is negative and I do not think he needs a repeat Trop -as we do not think the pain is due to ACS.  Additionally is complaining of back pain and leg pain.  The pain is provoked when he exerts himself.  He has no evidence of vascular insufficiency in the lower extremity at the [facility] is 1+ and equal bilaterally and the skin is warm to touch.  The back exam itself looks fine.  No fevers, no red flags suggesting cord compression -doubt deep space infection or lytic lesions.  X-ray for screening purposes ordered and is negative.  Final Clinical Impressions(s) / ED Diagnoses   Final diagnoses:  Weakness  Palpitations  Low back pain without sciatica, unspecified back pain laterality, unspecified chronicity    ED Discharge Orders    None       Varney Biles, MD 12/24/18 1435

## 2018-12-24 NOTE — Discharge Instructions (Addendum)
All the results in the ER are normal, labs and imaging. We are not sure what is causing your symptoms. The workup in the ER is not complete, and is limited to screening for life threatening and emergent conditions only, so please see a primary care doctor for further evaluation.  

## 2018-12-24 NOTE — ED Notes (Signed)
Pt. Able to communicate with his family via cell phone. PT. Concerned about how long will it be before I can leave. Explained to pt. Its the Dr.'s decision.

## 2018-12-24 NOTE — ED Notes (Signed)
Patient reports waking up with extreme weakness after about 3 weeks after covid positive test and thought it would get better but it is not at this time.

## 2019-01-22 ENCOUNTER — Ambulatory Visit (INDEPENDENT_AMBULATORY_CARE_PROVIDER_SITE_OTHER): Payer: Medicaid Other | Admitting: Internal Medicine

## 2019-01-22 ENCOUNTER — Encounter: Payer: Self-pay | Admitting: Internal Medicine

## 2019-01-22 ENCOUNTER — Other Ambulatory Visit: Payer: Self-pay

## 2019-01-22 VITALS — BP 110/64 | HR 78 | Temp 98.6°F | Ht 72.0 in | Wt 228.6 lb

## 2019-01-22 DIAGNOSIS — E042 Nontoxic multinodular goiter: Secondary | ICD-10-CM | POA: Diagnosis not present

## 2019-01-22 NOTE — Patient Instructions (Signed)
-   We will set you up for thyroid ultrasound, this will be done at [facility]. If you don't hear about it in 2 weeks, please give [place] a call.

## 2019-01-22 NOTE — Progress Notes (Signed)
  Name: Samuel [name]  MRN/ DOB: [id number], 03/04/1956    Age/ Sex: 63 y.o., male    PCP: [facility]   Reason for Endocrinology Evaluation: MNG     Date of Initial Endocrinology Evaluation: 01/22/2019     HPI: [name] is a 63 y.o. male with a past medical history of T2DM and MNG. The patient presented for initial endocrinology clinic visit on 01/22/2019 for consultative assistance with his MNG   Pt has been diagnosed with MNG since the 1990's, he had some kind of surgery on it in [place] in 1990's but ultrasound/CT imaging doesn't show any missing thyroid tissue. In 2014 he had a CT scan of the neck,that showed thyroglossal cysts and MNG ,  he has been evaluated by Otolaryngology Dr. Shoemaker with [facility] in 2015 and surgery has been recommended at the time but pt was lost to follow up.   Today she denies any local neck symptoms.       HISTORY:  Past Medical History:  Past Medical History:  Diagnosis Date  . Arthritis    Knees  . Diabetes mellitus without complication ([facility])   . Goiter    Multinodular Goiter with partial thyroidectomey in [place]  1998.  Ultrasound confirming diagnosis in 01/17/2014   Past Surgical History:  Past Surgical History:  Procedure Laterality Date  . APPENDECTOMY  2014  . THYROID SURGERY  1998   Enlarged and painful thyroid      Social History:  reports that he has never smoked. He has never used smokeless tobacco. He reports that he does not drink alcohol or use drugs.  Family History: family history includes Arthritis in his brother and sister; Prostate cancer in his brother.   HOME MEDICATIONS: Allergies as of 01/22/2019   No Known Allergies     Medication List       Accurate as of January 22, 2019  2:59 PM. If you have any questions, ask your nurse or doctor.        STOP taking these medications   azithromycin 250 MG tablet Commonly known as: Zithromax Stopped by: Ibtehal J Shamleffer, MD   etodolac 400 MG tablet  Commonly known as: LODINE Stopped by: Ibtehal J Shamleffer, MD   famotidine 20 MG tablet Commonly known as: PEPCID Stopped by: Ibtehal J Shamleffer, MD   glucose blood test strip Stopped by: Ibtehal J Shamleffer, MD   ondansetron 4 MG disintegrating tablet Commonly known as: ZOFRAN-ODT Stopped by: Ibtehal J Shamleffer, MD   traMADol 50 MG tablet Commonly known as: ULTRAM Stopped by: Ibtehal J Shamleffer, MD     TAKE these medications   albuterol 108 (90 Base) MCG/ACT inhaler Commonly known as: VENTOLIN HFA Inhale 2 puffs into the lungs every 6 (six) hours as needed for wheezing or shortness of breath.   blood glucose meter kit and supplies Kit Check sugars twice daily, in morning when fasting and in evening before dinner   metFORMIN 500 MG 24 hr tablet Commonly known as: GLUCOPHAGE-XR 1 tab by mouth daily with dinner         REVIEW OF SYSTEMS: A comprehensive ROS was conducted with the patient and is negative except as per HPI and below:  Review of Systems  Constitutional: Negative for chills and fever.  HENT: Negative for congestion and sore throat.   Eyes: Negative for blurred vision and pain.  Respiratory: Negative for cough and shortness of breath.   Cardiovascular: Positive for palpitations. Negative for chest pain.    Gastrointestinal: Negative for constipation and nausea.  Genitourinary: Positive for frequency.  Neurological: Positive for tingling. Negative for tremors.  Endo/Heme/Allergies: Positive for polydipsia.  Psychiatric/Behavioral: Negative for depression. The patient is not nervous/anxious.        OBJECTIVE:  VS: BP 110/64 (BP Location: Left Arm, Patient Position: Sitting, Cuff Size: Large)   Pulse 78   Temp 98.6 F (37 C)   Ht 6' (1.829 m)   Wt 228 lb 9.6 oz (103.7 kg)   SpO2 99%   BMI 31.00 kg/m    Wt Readings from Last 3 Encounters:  01/22/19 228 lb 9.6 oz (103.7 kg)  12/24/18 220 lb (99.8 kg)  11/13/18 216 lb 4.3 oz (98.1 kg)      EXAM: General: Pt appears well and is in NAD  Hydration: Well-hydrated with moist mucous membranes and good skin turgor  Eyes: External eye exam normal without stare, lid lag or exophthalmos.  EOM intact.  PERRL.  Ears, Nose, Throat: Hearing: Grossly intact bilaterally Dental: Good dentition  Throat: Clear without mass, erythema or exudate  Neck: General: Supple without adenopathy. Thyroid: Thyroid size is enlarged, multiple nodules appreciated. Anterior neck surgical scar from previous thyroid surgery  Lungs: Clear with good BS bilat with no rales, rhonchi, or wheezes  Heart: Auscultation: RRR.  Abdomen: Normoactive bowel sounds, soft, nontender, without masses or organomegaly palpable  Extremities:  BL LE: No pretibial edema normal ROM and strength.  Skin: Hair: Texture and amount normal with gender appropriate distribution Skin Inspection: No rashes. Skin Palpation: Skin temperature, texture, and thickness normal to palpation  Neuro: Cranial nerves: II - XII grossly intact  Motor: Normal strength throughout DTRs: 2+ and symmetric in UE without delay in relaxation phase  Mental Status: Judgment, insight: Intact Orientation: Oriented to time, place, and person Mood and affect: No depression, anxiety, or agitation     DATA REVIEWED: Results for CORDARIOUS, ZEEK (MRN [id number]) as of 01/23/2019 13:56  Ref. Range 01/22/2019 15:31  TSH Latest Ref Range: 0.35 - 4.50 uIU/mL 0.48  T4,Free(Direct) Latest Ref Range: 0.60 - 1.60 ng/dL 0.74   Ultrasound 01/17/2014  Right thyroid lobe  Measurements: 7.8 x 4.5 x 2.5 cm. Heteroechogenicity with vague nodularity including a 0.9 cm primarily cystic lesion.  Left thyroid lobe  Measurements: 6.5 x 3.6 by 4.6 cm. Irregular primarily cystic nodule with internal septations measuring 2.2 x 1.5 x 2.2 cm in the upper pole, with a internal debris level. Vague additional scattered nodularity including a 2.1 x 1.5 cm isoechoic nodule.  Isthmus   Thickness: 2.3 cm. 4.0 x 2.6 x 3.8 cm primarily cystic complex lesion of the isthmus has slight internal lobularity and probable blood products. No internal vascularity shown.  Lymphadenopathy  None visualized.  IMPRESSION: 1. Enlarged multinodular and heteroechogenicity compatible with multinodular goiter. Given the pain, the possibility of superimposed thyroiditis should be considered. The patient gives a history of having a prior thyroid procedure but neither of the lobes has been removed. Cystic lesion in the isthmus is complex and may contain some blood products, as suggested on CT.  CT Scan (11/26/2012)   A right paramidline anterior mass lesion is present into the thyroid cartilage.  The lesion measures 3.2 x 2.1 x 3.5 cm.  There appears to be a thin rim of peripheral enhancement.  A denser nodular component of the lesion is noted on the left, measuring 9 x 8 mm.  This lesion appears to communicate with thyroid at the level of the isthmus.  This  may have been the area of previous surgery.  The thyroid is enlarged with extensive heterogeneity and multiple nodules.  The largest is on the left, measuring nearly 3 cm.  No significant cervical adenopathy is present.  Multiple small nodes are evident.  No significant mucosal or submucosal lesions are present.  The vocal cords are midline and symmetric.  A 2 mm right upper lobe nodule is present.  The lungs are otherwise clear.  The bone windows demonstrate no focal lytic or blastic lesions. The vertebral body heights and alignment are maintained.  IMPRESSION:  1.  3.5 cm peripherally enhancing lesion anterior to the thyroid cartilage with a more nodular component on the left.  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 2.  Markedly heterogeneous and enlarged thyroid gland compatible with a multinodular goiter.  The largest lesion is on the  left, measuring nearly 3 cm. 3.  No significant cervical adenopathy.   ASSESSMENT/PLAN/RECOMMENDATIONS:   1. Multinodular Goiter:  - No local neck symptoms - Hx of thyroid sx in the 1990's. It seems that the glad re-grew - Prior attempts as surgical intervention were not done due to patient lack of follow up.  - We will proceed with thyroid ultrasound, at this point h is not interested in surgical intervention - Pt apparently was prescribed Iodine back in [place], but has been living in the [place] since early 2000's. Pt advised no need for iodine as its very rare in the [place] with salts and foods fortified with iodine.  - TFT's are normal today    F/u in 6 months   Signed electronically by: Abby Jaralla Shamleffer, MD  [facility]  [facility] [address] [place], [place] [address] Phone: [phone] FAX: [phone]   CC: [facility] [address] [place] [address] Phone: [phone] Fax: [phone]   Return to Endocrinology clinic as below: No future appointments.       

## 2019-01-23 LAB — TSH: TSH: 0.48 u[IU]/mL (ref 0.35–4.50)

## 2019-01-23 LAB — T4, FREE: Free T4: 0.74 ng/dL (ref 0.60–1.60)

## 2019-02-13 ENCOUNTER — Other Ambulatory Visit: Payer: Self-pay

## 2019-02-13 ENCOUNTER — Ambulatory Visit (HOSPITAL_COMMUNITY)
Admission: EM | Admit: 2019-02-13 | Discharge: 2019-02-13 | Disposition: A | Discharge status: Home / Self Care | Payer: Medicaid Other

## 2019-02-13 ENCOUNTER — Encounter (HOSPITAL_COMMUNITY): Payer: Self-pay

## 2019-02-13 DIAGNOSIS — E042 Nontoxic multinodular goiter: Secondary | ICD-10-CM

## 2019-02-13 NOTE — ED Triage Notes (Signed)
Pt states he has a thyroid condition and its called a Counselling psychologist ) pt states his [place] is bothering him now. Pt states he needs the medicine he was taking for the issue. Pt stopped taking the medicine a long time ago.

## 2019-02-13 NOTE — Discharge Instructions (Addendum)
Keep your appointment on the 27th to get the ultrasound of your thyroid.  Follow up with your endocrinologist.

## 2019-02-14 NOTE — ED Provider Notes (Signed)
[facility]    CSN: [id number] Arrival date & time: 02/13/19  1430      History   Chief Complaint Chief Complaint  Patient presents with  . Oral Swelling    HPI Samuel [name] is a 63 y.o. male.   Patient is a 63 year old male with past medical history of arthritis, diabetes, goiter.  He presents today requesting medicine for his goiter.  He saw his endocrinologist on 30 June and was evaluated for this.  She has scheduled him for an appointment for a ultrasound on 27 July.  He is here requesting pills that he received previous when [place] was [place] a different country.  Unsure whether medication is called.  Reports that his goiter is a worsening and becoming larger.  Denies any significant pain.  Denies any trouble swallowing or breathing.  Recent thyroid levels were normal. No fever, chills, night sweats, fatigue, weight loss.   ROS per HPI      Past Medical History:  Diagnosis Date  . Arthritis    Knees  . Diabetes mellitus without complication ([place])   . Goiter    Multinodular Goiter with partial thyroidectomey in [place]  1998.  Ultrasound confirming diagnosis in 01/17/2014    Patient Active Problem List   Diagnosis Date Noted  . COVID-19 virus infection 11/13/2018  . Acute on chronic respiratory failure with hypoxia ([place]) 11/13/2018  . Type 2 diabetes mellitus ([place]) 11/13/2018  . Unspecified vitamin D deficiency 03/20/2014  . Dental cavities 03/20/2014  . IFG (impaired fasting glucose) 02/26/2014  . Multinodular goiter 02/26/2014  . Goiter     Past Surgical History:  Procedure Laterality Date  . APPENDECTOMY  2014  . THYROID SURGERY  1998   Enlarged and painful thyroid  . THYROID SURGERY  1990       Home Medications    Prior to Admission medications   Medication Sig Start Date End Date Taking? Authorizing Provider  albuterol (VENTOLIN HFA) 108 (90 Base) MCG/ACT inhaler Inhale 2 puffs into the lungs every 6 (six) hours as needed for wheezing or  shortness of breath. 11/16/18   Thurnell Lose, MD  blood glucose meter kit and supplies KIT Check sugars twice daily, in morning when fasting and in evening before dinner 02/08/16   Mack Hook, MD  metFORMIN (GLUCOPHAGE-XR) 500 MG 24 hr tablet 1 tab by mouth daily with dinner Patient not taking: Reported on 08/13/2018 02/08/16   Mack Hook, MD    Family History Family History  Problem Relation Age of Onset  . Arthritis Sister   . Arthritis Brother   . Prostate cancer Brother     Social History Social History   Tobacco Use  . Smoking status: Never Smoker  . Smokeless tobacco: Never Used  Substance Use Topics  . Alcohol use: No    Alcohol/week: 0.0 standard drinks  . Drug use: No     Allergies   Patient has no known allergies.   Review of Systems Review of Systems   Physical Exam Triage Vital Signs ED Triage Vitals  Enc Vitals Group     BP 02/13/19 1545 132/69     Pulse Rate 02/13/19 1545 76     Resp 02/13/19 1545 18     Temp 02/13/19 1545 98.2 F (36.8 C)     Temp Source 02/13/19 1545 Oral     SpO2 02/13/19 1545 97 %     Weight --      Height --  Head Circumference --      Peak Flow --      Pain Score 02/13/19 1544 0     Pain Loc --      Pain Edu? --      Excl. in [place]? --    No data found.  Updated Vital Signs BP 132/69 (BP Location: Right Arm)   Pulse 76   Temp 98.2 F (36.8 C) (Oral)   Resp 18   SpO2 97%   Visual Acuity Right Eye Distance:   Left Eye Distance:   Bilateral Distance:    Right Eye Near:   Left Eye Near:    Bilateral Near:     Physical Exam Vitals signs and nursing note reviewed.  Constitutional:      General: He is not in acute distress.    Appearance: Normal appearance. He is not ill-appearing, toxic-appearing or diaphoretic.  HENT:     Head: Normocephalic and atraumatic.     Nose: Nose normal.     Mouth/Throat:     Pharynx: Oropharynx is clear.  Neck:     Musculoskeletal: Normal range of motion.      Thyroid: Thyromegaly present. No thyroid tenderness.     Comments: Enlarged thyroid with multiple nodules palpated. Non tender.  Pulmonary:     Effort: Pulmonary effort is normal.  Musculoskeletal: Normal range of motion.  Skin:    General: Skin is warm and dry.  Neurological:     Mental Status: He is alert.  Psychiatric:        Mood and Affect: Mood normal.      UC Treatments / Results  Labs (all labs ordered are listed, but only abnormal results are displayed) Labs Reviewed - No data to display  EKG   Radiology No results found.  Procedures Procedures (including critical care time)  Medications Ordered in UC Medications - No data to display  Initial Impression / Assessment and Plan / UC Course  I have reviewed the triage vital signs and the nursing notes.  Pertinent labs & imaging results that were available during my care of the patient were reviewed by me and considered in my medical decision making (see chart for details).     Patient here requesting some sort of medication for his thyroid. Really unsure of what it is.  Reports he took it years ago when he was in a different country Stated to patient this is inappropriate for urgent care and he needs to follow with his endocrinologist for management of his thyroid problems.  He just saw her on 30 June. Patient was unaware he had a ultrasound scheduled on 27 July.  Printed out a paper for him with the scheduled time and date and place to go for ultrasound. Recommended that he follow-up with this appointment to get a better look at any changes in his thyroid that need to be managed. He will need to follow with his endocrinologist for further management of his thyroid Patient understanding and agree. Final Clinical Impressions(s) / UC Diagnoses   Final diagnoses:  Multinodular goiter     Discharge Instructions     Keep your appointment on the 27th to get the ultrasound of your thyroid.  Follow up with  your endocrinologist.    ED Prescriptions    None     Controlled Substance Prescriptions [place] Controlled Substance Registry consulted? Not Applicable   Orvan July, NP 02/14/19 [phone]

## 2019-02-18 ENCOUNTER — Ambulatory Visit
Admission: RE | Admit: 2019-02-18 | Discharge: 2019-02-18 | Disposition: A | Discharge status: Home / Self Care | Payer: Medicaid Other | Source: Ambulatory Visit | Attending: Internal Medicine | Admitting: Internal Medicine

## 2019-02-18 DIAGNOSIS — E042 Nontoxic multinodular goiter: Secondary | ICD-10-CM

## 2019-02-20 ENCOUNTER — Other Ambulatory Visit: Payer: Self-pay

## 2019-02-20 ENCOUNTER — Emergency Department (HOSPITAL_COMMUNITY)
Admission: EM | Admit: 2019-02-20 | Discharge: 2019-02-20 | Disposition: A | Discharge status: Home / Self Care | Payer: Medicaid Other | Attending: Emergency Medicine | Admitting: Emergency Medicine

## 2019-02-20 ENCOUNTER — Encounter (HOSPITAL_COMMUNITY): Payer: Self-pay | Admitting: *Deleted

## 2019-02-20 DIAGNOSIS — M545 Low back pain: Secondary | ICD-10-CM | POA: Diagnosis not present

## 2019-02-20 DIAGNOSIS — G8929 Other chronic pain: Secondary | ICD-10-CM | POA: Insufficient documentation

## 2019-02-20 DIAGNOSIS — E119 Type 2 diabetes mellitus without complications: Secondary | ICD-10-CM | POA: Diagnosis not present

## 2019-02-20 MED ORDER — LIDOCAINE 5 % EX PTCH
1.0000 | MEDICATED_PATCH | Freq: Once | CUTANEOUS | Status: DC
  Administered 2019-02-20: 16:00:00 1 via TRANSDERMAL
  Filled 2019-02-20: qty 1

## 2019-02-20 MED ORDER — KETOROLAC TROMETHAMINE 30 MG/ML IJ SOLN
30.0000 mg | Freq: Once | INTRAMUSCULAR | Status: AC
  Administered 2019-02-20: 30 mg via INTRAMUSCULAR
  Filled 2019-02-20: qty 1

## 2019-02-20 NOTE — ED Provider Notes (Signed)
[facility] EMERGENCY DEPARTMENT Provider Note   CSN: [id number] Arrival date & time: 02/20/19  1106    History   Chief Complaint Chief Complaint  Patient presents with  . Joint Pain    HPI Samuel [name] is a 63 y.o. male past medical history of arthritis, diabetes, goiter presents emergency department today with chief complaint of joint pain.  Patient states this has been going on for years.  He states pain has gotten worse in his lower back and hips.  The pain radiates down to his knees.  He states the pain is sharp and he rates it 7 out of 10 in severity.  Pain is intermittent.  He has not taken anything for pain prior to arrival. Denies fevers, weight loss, numbness/weakness of upper and lower extremities, bowel/bladder incontinence, urinary retention, history of cancer, saddle anesthesia, history of back surgery, history of IVDA.  Chart review shows patient had x-rays of lumbar spine on 12/24/2018 that shows no acute abnormalities of lumbar spine, chronic to space narrowing at L5-S1.  History provided by patient with additional history obtained from chart review.      Past Medical History:  Diagnosis Date  . Arthritis    Knees  . Diabetes mellitus without complication ([place])   . Goiter    Multinodular Goiter with partial thyroidectomey in [place]  1998.  Ultrasound confirming diagnosis in 01/17/2014    Patient Active Problem List   Diagnosis Date Noted  . COVID-19 virus infection 11/13/2018  . Acute on chronic respiratory failure with hypoxia ([place]) 11/13/2018  . Type 2 diabetes mellitus ([place]) 11/13/2018  . Unspecified vitamin D deficiency 03/20/2014  . Dental cavities 03/20/2014  . IFG (impaired fasting glucose) 02/26/2014  . Multinodular goiter 02/26/2014  . Goiter     Past Surgical History:  Procedure Laterality Date  . APPENDECTOMY  2014  . THYROID SURGERY  1998   Enlarged and painful thyroid  . THYROID SURGERY  1990        Home Medications     Prior to Admission medications   Medication Sig Start Date End Date Taking? Authorizing Provider  albuterol (VENTOLIN HFA) 108 (90 Base) MCG/ACT inhaler Inhale 2 puffs into the lungs every 6 (six) hours as needed for wheezing or shortness of breath. 11/16/18   Thurnell Lose, MD  blood glucose meter kit and supplies KIT Check sugars twice daily, in morning when fasting and in evening before dinner 02/08/16   Mack Hook, MD  metFORMIN (GLUCOPHAGE-XR) 500 MG 24 hr tablet 1 tab by mouth daily with dinner Patient not taking: Reported on 08/13/2018 02/08/16   Mack Hook, MD    Family History Family History  Problem Relation Age of Onset  . Arthritis Sister   . Arthritis Brother   . Prostate cancer Brother     Social History Social History   Tobacco Use  . Smoking status: Never Smoker  . Smokeless tobacco: Never Used  Substance Use Topics  . Alcohol use: No    Alcohol/week: 0.0 standard drinks  . Drug use: No     Allergies   Patient has no known allergies.   Review of Systems Review of Systems  Constitutional: Negative for chills and fever.  Respiratory: Negative for shortness of breath.   Cardiovascular: Negative for chest pain.  Gastrointestinal: Negative for abdominal distention, abdominal pain, diarrhea, nausea and vomiting.  Genitourinary: Negative for difficulty urinating, frequency and urgency.  Musculoskeletal: Positive for back pain.  Skin: Negative for wound.  Neurological: Negative for weakness and numbness.     Physical Exam Updated Vital Signs BP 134/82 (BP Location: Right Arm)   Pulse 88   Temp 98.8 F (37.1 C) (Oral)   Resp 16   SpO2 95%   Physical Exam Vitals signs and nursing note reviewed.  Constitutional:      Appearance: He is well-developed. He is not ill-appearing or toxic-appearing.  HENT:     Head: Normocephalic and atraumatic.     Nose: Nose normal.  Eyes:     General: No scleral icterus.       Right eye: No  discharge.        Left eye: No discharge.     Conjunctiva/sclera: Conjunctivae normal.  Neck:     Musculoskeletal: Normal range of motion.     Vascular: No JVD.  Cardiovascular:     Rate and Rhythm: Normal rate and regular rhythm.     Pulses: Normal pulses.          Radial pulses are 2+ on the right side and 2+ on the left side.       Dorsalis pedis pulses are 2+ on the right side and 2+ on the left side.     Heart sounds: Normal heart sounds.  Pulmonary:     Effort: Pulmonary effort is normal.     Breath sounds: Normal breath sounds.  Abdominal:     General: There is no distension.  Musculoskeletal: Normal range of motion.     Comments: Full range of motion of the thoracic spine and lumbar spine with flexion, hyperextension, and lateral flexion. No midline tenderness or stepoffs. No tenderness to palpation of the spinous processes of the thoracic spine or lumbar spine. Tenderness to palpation of the paraspinous muscles of right lumbar spine.  Skin:    General: Skin is warm and dry.  Neurological:     Mental Status: He is oriented to person, place, and time.     GCS: GCS eye subscore is 4. GCS verbal subscore is 5. GCS motor subscore is 6.     Comments: Sensation grossly intact to light touch in the lower extremities bilaterally. No saddle anesthesias. Strength 5/5 with flexion and extension at the bilateral hips, knees, and ankles. No noted gait deficit. Coordination intact with heel to shin testing.   Psychiatric:        Behavior: Behavior normal.      ED Treatments / Results  Labs (all labs ordered are listed, but only abnormal results are displayed) Labs Reviewed - No data to display  EKG None  Radiology No results found.  Procedures Procedures (including critical care time)  Medications Ordered in ED Medications  lidocaine (LIDODERM) 5 % 1 patch (1 patch Transdermal Patch Applied 02/20/19 1609)  ketorolac (TORADOL) 30 MG/ML injection 30 mg (30 mg  Intramuscular Given 02/20/19 1609)     Initial Impression / Assessment and Plan / ED Course  I have reviewed the triage vital signs and the nursing notes.  Pertinent labs & imaging results that were available during my care of the patient were reviewed by me and considered in my medical decision making (see chart for details).   Patient is well appearing, in no acute distress. Normal neurological exam, no evidence of urinary incontinence or retention, pain is consistently reproducible. There is no evidence of AAA or concern for dissection at this time.   Patient can walk but states is painful.  No loss of bowel or bladder control.  No concern  for cauda equina.  No fever, night sweats, weight loss, h/o cancer, IVDU.  Pain treated here in the department with adequate improvement. RICE protocol and ibuprofen/tyelnol for pain indicated and discussed with patient. Advised pt to take ibuprofen for short course only and discussed possible SE. I have also discussed reasons to return immediately to the ER.  Patient expresses understanding and agrees with plan.   This note was prepared using Dragon voice recognition software and may include unintentional dictation errors due to the inherent limitations of voice recognition software.    Final Clinical Impressions(s) / ED Diagnoses   Final diagnoses:  Chronic right-sided low back pain, unspecified whether sciatica present    ED Discharge Orders    None       Cherre Robins, PA-C 02/20/19 [place], Ankit, MD 02/21/19 1435

## 2019-02-20 NOTE — ED Triage Notes (Signed)
Pt reports having pain for extended amount of time to his lower back, bilateral hips and down into bilateral legs/knees. Ambulatory and no distress noted at triage.

## 2019-02-20 NOTE — Discharge Instructions (Addendum)
Your back pain should be treated with medicines such as ibuprofen or tylenol and this back pain should get better over the next 2 weeks.   Follow Up: Please follow up with your primary healthcare provider in 1-2 weeks for reassessment. if you do not have a primary care doctor use the resource guide provided to find one.  Low back pain is discomfort in the lower back that may be due to injuries to muscles and ligaments around the spine. Occasionally, it may be caused by a a problem to a part of the spine called a disc. The pain may last several days or a week;  However, most patients get completely well in 4 weeks.   1. Medications: Alternate 600 mg of ibuprofen and [phone] mg of Tylenol every 3 hours as needed for pain. Do not exceed 4000 mg of Tylenol daily.  Take ibuprofen with food to avoid upset stomach issues.    2. Treatment: rest, drink plenty of fluids, gentle stretching as discussed (see attached), alternate ice and heat (or stick with whichever feels best) 20 minutes on 20 minutes off. Maintaining your daily activities, including walking, is encourged, as it will help you get better faster than just staying in bed.    Be aware that if you develop new symptoms, such as a fever, leg weakness, difficulty with or loss of control of your urine or bowels, abdominal pain, or more severe pain, you will need to seek medical attention immediately and  / or return to the Emergency department.

## 2019-07-18 ENCOUNTER — Emergency Department (HOSPITAL_COMMUNITY)
Admission: EM | Admit: 2019-07-18 | Discharge: 2019-07-18 | Disposition: A | Discharge status: Home / Self Care | Payer: Medicaid Other | Attending: Emergency Medicine | Admitting: Emergency Medicine

## 2019-07-18 ENCOUNTER — Other Ambulatory Visit: Payer: Self-pay

## 2019-07-18 ENCOUNTER — Encounter (HOSPITAL_COMMUNITY): Payer: Self-pay

## 2019-07-18 DIAGNOSIS — G8929 Other chronic pain: Secondary | ICD-10-CM | POA: Diagnosis not present

## 2019-07-18 DIAGNOSIS — R739 Hyperglycemia, unspecified: Secondary | ICD-10-CM | POA: Diagnosis not present

## 2019-07-18 DIAGNOSIS — E118 Type 2 diabetes mellitus with unspecified complications: Secondary | ICD-10-CM

## 2019-07-18 DIAGNOSIS — Z8709 Personal history of other diseases of the respiratory system: Secondary | ICD-10-CM | POA: Insufficient documentation

## 2019-07-18 DIAGNOSIS — E049 Nontoxic goiter, unspecified: Secondary | ICD-10-CM | POA: Diagnosis not present

## 2019-07-18 DIAGNOSIS — M549 Dorsalgia, unspecified: Secondary | ICD-10-CM | POA: Insufficient documentation

## 2019-07-18 DIAGNOSIS — L853 Xerosis cutis: Secondary | ICD-10-CM | POA: Insufficient documentation

## 2019-07-18 DIAGNOSIS — M25569 Pain in unspecified knee: Secondary | ICD-10-CM | POA: Insufficient documentation

## 2019-07-18 DIAGNOSIS — R682 Dry mouth, unspecified: Secondary | ICD-10-CM | POA: Diagnosis present

## 2019-07-18 LAB — BASIC METABOLIC PANEL
Anion gap: 11 (ref 5–15)
BUN: 11 mg/dL (ref 8–23)
CO2: 24 mmol/L (ref 22–32)
Calcium: 9.3 mg/dL (ref 8.9–10.3)
Chloride: 100 mmol/L (ref 98–111)
Creatinine, Ser: 0.96 mg/dL (ref 0.61–1.24)
GFR calc Af Amer: >60 mL/min (ref >60)
GFR calc non Af Amer: >60 mL/min (ref >60)
Glucose, Bld: 495 mg/dL — ABNORMAL HIGH (ref 70–99)
Potassium: 4.5 mmol/L (ref 3.5–5.1)
Sodium: 135 mmol/L (ref 135–145)

## 2019-07-18 LAB — URINALYSIS, ROUTINE W REFLEX MICROSCOPIC
Bacteria, UA: NONE SEEN
Bilirubin Urine: NEGATIVE
Glucose, UA: >=500 mg/dL — AB
Hgb urine dipstick: NEGATIVE
Ketones, ur: NEGATIVE mg/dL
Leukocytes,Ua: NEGATIVE
Nitrite: NEGATIVE
Protein, ur: NEGATIVE mg/dL
Specific Gravity, Urine: 1.035 — ABNORMAL HIGH (ref 1.005–1.030)
pH: 5 (ref 5.0–8.0)

## 2019-07-18 LAB — CBC
HCT: 45.9 % (ref 39.0–52.0)
Hemoglobin: 14.8 g/dL (ref 13.0–17.0)
MCH: 27 pg (ref 26.0–34.0)
MCHC: 32.2 g/dL (ref 30.0–36.0)
MCV: 83.6 fL (ref 80.0–100.0)
Platelets: 266 10*3/uL (ref 150–400)
RBC: 5.49 MIL/uL (ref 4.22–5.81)
RDW: 12.8 % (ref 11.5–15.5)
WBC: 4.9 10*3/uL (ref 4.0–10.5)
nRBC: 0 % (ref 0.0–0.2)

## 2019-07-18 LAB — CBG MONITORING, ED: Glucose-Capillary: 496 mg/dL — ABNORMAL HIGH (ref 70–99)

## 2019-07-18 MED ORDER — METFORMIN HCL ER 500 MG PO TB24: ORAL_TABLET | ORAL | 2 refills | Status: DC

## 2019-07-18 NOTE — ED Triage Notes (Signed)
Pt reports dry mouth and dry hands for 3 days. States he used to take a medication to help with it but he cannot remember the name.

## 2019-07-18 NOTE — Discharge Instructions (Addendum)
As we discussed, your blood sugar today was high.  You likely need to be restarted on your Metformin.  I provided you a prescription.  As we discussed, you will need to follow-up with endocrinology in terms of your goiter.  Return the emergency department for any difficulty breathing, difficulty swallowing, chest pain or any other worsening or concerning symptoms.

## 2019-07-18 NOTE — ED Provider Notes (Signed)
[facility] EMERGENCY DEPARTMENT Provider Note   CSN: [id number] Arrival date & time: 07/18/19  1028     History Chief Complaint  Patient presents with  . Dry Mouth  . Dry Hands    Samuel [name] is a 63 y.o. male has no history of diabetes, arthritis in knees, goiter presents for evaluation of multiple complaints.  Patient reports that over the last 2 to 3 days, he has had dry mouth and dry hands.  He has not tried any medications help with his symptoms.  He states he has had this before that he was given a medication that helped.  He does not recall what the medication was.  Additionally, he is concerned about his goiter.  He states that he has not followed up with a doctor regarding this.  He reports about 10 years ago, when he came to [place], he was seen by a doctor and started on medications but states he has not taken it for several years.  He is still been able to swallow without any difficulty and is able to tolerate his secretions.  He has not any trouble breathing.  Patient also reports chronic pain to his back and bilateral knees.  He states that this has been diagnosed as arthritis but states that he was given a medication which helped and he does not have that medication anymore.  He denies any fevers, chest pain, difficulty breathing, abdominal pain, nausea/vomiting, numbness/weakness of his arms or legs.  The history is provided by the patient.       Past Medical History:  Diagnosis Date  . Arthritis    Knees  . Diabetes mellitus without complication ([place])   . Goiter    Multinodular Goiter with partial thyroidectomey in [place]  1998.  Ultrasound confirming diagnosis in 01/17/2014    Patient Active Problem List   Diagnosis Date Noted  . COVID-19 virus infection 11/13/2018  . Acute on chronic respiratory failure with hypoxia ([place]) 11/13/2018  . Type 2 diabetes mellitus ([place]) 11/13/2018  . Unspecified vitamin D deficiency 03/20/2014  . Dental cavities  03/20/2014  . IFG (impaired fasting glucose) 02/26/2014  . Multinodular goiter 02/26/2014  . Goiter     Past Surgical History:  Procedure Laterality Date  . APPENDECTOMY  2014  . THYROID SURGERY  1998   Enlarged and painful thyroid  . THYROID SURGERY  1990       Family History  Problem Relation Age of Onset  . Arthritis Sister   . Arthritis Brother   . Prostate cancer Brother     Social History   Tobacco Use  . Smoking status: Never Smoker  . Smokeless tobacco: Never Used  Substance Use Topics  . Alcohol use: No    Alcohol/week: 0.0 standard drinks  . Drug use: No    Home Medications Prior to Admission medications   Medication Sig Start Date End Date Taking? Authorizing Provider  albuterol (VENTOLIN HFA) 108 (90 Base) MCG/ACT inhaler Inhale 2 puffs into the lungs every 6 (six) hours as needed for wheezing or shortness of breath. 11/16/18   Thurnell Lose, MD  blood glucose meter kit and supplies KIT Check sugars twice daily, in morning when fasting and in evening before dinner 02/08/16   Mack Hook, MD  metFORMIN (GLUCOPHAGE-XR) 500 MG 24 hr tablet 1 tab by mouth daily with dinner 07/18/19   Volanda Napoleon, PA-C    Allergies    Patient has no known allergies.  Review of  Systems   Review of Systems  Constitutional: Negative for fever.  HENT:       Goiter  Respiratory: Negative for cough and shortness of breath.   Cardiovascular: Negative for chest pain.  Gastrointestinal: Negative for abdominal pain, nausea and vomiting.  Genitourinary: Negative for dysuria and hematuria.  Musculoskeletal: Positive for back pain.       Knee pain  Neurological: Negative for headaches.  All other systems reviewed and are negative.   Physical Exam Updated Vital Signs BP 112/75   Pulse 83   Temp 98.1 F (36.7 C) (Oral)   Resp 16   SpO2 95%   Physical Exam Vitals and nursing note reviewed.  Constitutional:      Appearance: Normal appearance. He is  well-developed.  HENT:     Head: Normocephalic and atraumatic.     Mouth/Throat:     Comments: Airways patent, phonation is intact.  No oral angioedema. No oral lesions  Eyes:     General: Lids are normal.     Conjunctiva/sclera: Conjunctivae normal.     Pupils: Pupils are equal, round, and reactive to light.  Neck:     Thyroid: Thyroid mass present.     Comments: Swelling noted of the arm.  No overlying warmth, erythema.  No difficulty swallowing. Cardiovascular:     Rate and Rhythm: Normal rate and regular rhythm.     Pulses: Normal pulses.          Radial pulses are 2+ on the right side and 2+ on the left side.     Heart sounds: Normal heart sounds. No murmur. No friction rub. No gallop.   Pulmonary:     Effort: Pulmonary effort is normal.     Breath sounds: Normal breath sounds.     Comments: Lungs clear to auscultation bilaterally.  Symmetric chest rise.  No wheezing, rales, rhonchi. Abdominal:     Palpations: Abdomen is soft. Abdomen is not rigid.     Tenderness: There is no abdominal tenderness. There is no guarding.  Musculoskeletal:        General: Normal range of motion.     Cervical back: Full passive range of motion without pain.  Skin:    General: Skin is warm and dry.     Capillary Refill: Capillary refill takes less than 2 seconds.  Neurological:     Mental Status: He is alert and oriented to person, place, and time.  Psychiatric:        Speech: Speech normal.     ED Results / Procedures / Treatments   Labs (all labs ordered are listed, but only abnormal results are displayed) Labs Reviewed  BASIC METABOLIC PANEL - Abnormal; Notable for the following components:      Result Value   Glucose, Bld 495 (*)    All other components within normal limits  URINALYSIS, ROUTINE W REFLEX MICROSCOPIC - Abnormal; Notable for the following components:   Color, Urine STRAW (*)    Specific Gravity, Urine 1.035 (*)    Glucose, UA >=500 (*)    All other components within  normal limits  CBG MONITORING, ED - Abnormal; Notable for the following components:   Glucose-Capillary 496 (*)    All other components within normal limits  CBC  CBG MONITORING, ED    EKG None  Radiology No results found.  Procedures Procedures (including critical care time)  Medications Ordered in ED Medications - No data to display  ED Course  I have reviewed the triage  vital signs and the nursing notes.  Pertinent labs & imaging results that were available during my care of the patient were reviewed by me and considered in my medical decision making (see chart for details).    MDM Rules/Calculators/A&P                      63 year old male who presents for evaluations of dry mouth, dry hands, goiter, persistent lower back and knee pain.  He states all these have been issues that have been ongoing for several years.  He states when he came to [place] about 10 years ago, he was given medication for his goiter.  He states he would like that medication again.  Additionally, he states that he has been having ongoing back and knee pain.  No new falls, traumas.  He is requesting medication that he has had before for this.  Patient states that he has not seen a primary care doctor in several months.  He denies any fevers, chest pain, difficulty breathing. Patient is afebrile, non-toxic appearing, sitting comfortably on examination table. Vital signs reviewed and stable.  Plan to check labs.  Urine shows glucosuria.  No infectious etiology.  BMP shows glucose of 495.  BUN and creatinine within normal limits.  CBC shows no leukocytosis.  Hemoglobin is stable.  We will plan to restart him on Metformin as he has been on that previously.  At this time, he is able to swallow and tolerate secretions without difficulty.  I instructed that he will need to either follow-up with a primary care doctor and endocrinologist regarding his thyroid.  Additionally, instructed patient on supportive care  measures for dry skin. At this time, patient exhibits no emergent life-threatening condition that require further evaluation in ED or admission. Patient had ample opportunity for questions and discussion. All patient's questions were answered with full understanding. Strict return precautions discussed. Patient expresses understanding and agreement to plan.   Portions of this note were generated with [employer]. Dictation errors may occur despite best attempts at proofreading.  Final Clinical Impression(s) / ED Diagnoses Final diagnoses:  Hyperglycemia  Dry skin  Goiter    Rx / DC Orders ED Discharge Orders         Ordered    metFORMIN (GLUCOPHAGE-XR) 500 MG 24 hr tablet     07/18/19 1322           Desma Mcgregor 07/18/19 1416    Lacretia Leigh, MD 07/19/19 [phone]

## 2019-07-24 ENCOUNTER — Ambulatory Visit: Payer: Medicaid Other | Admitting: Internal Medicine

## 2019-07-24 NOTE — Progress Notes (Deleted)
Name: Samuel [name]  Age/ Sex: 63 y.o., male   MRN/ DOB: [id number], [birth year]     PCP: [facility] [place]   Reason for Endocrinology Evaluation: Type 2 Diabetes Mellitus  Initial Endocrine Consultative Visit: 01/22/2019    PATIENT IDENTIFIER: [name] is a 63 y.o. male with a past medical history of T2DM and MNG. The patient has followed with Endocrinology clinic since 01/22/2019 for consultative assistance with management of his diabetes.  DIABETIC HISTORY:  Mr. Longshore was diagnosed with DM*** ***, and started insulin therapy approximately *** years after diagnosis. ***. His hemoglobin A1c has ranged from *** in ***, peaking at *** in ***.     THYROID HISTORY:  Pt has been diagnosed with MNG since the 1990's, he had some kind of surgery on it in [place] in 1990's but ultrasound/CT imaging doesn't show any missing thyroid tissue. In 2014 he had a CT scan of the neck,that showed thyroglossal cysts and MNG ,  he has been evaluated by Otolaryngology Dr. Wilburn Cornelia with [facility] in 2015 and surgery has been recommended at the time but pt was lost to follow up.     SUBJECTIVE:   During the last visit (***): ***  Today (07/24/2019): [name]  He checks his blood sugars *** times daily, preprandial to breakfast and ***. The patient has *** had hypoglycemic episodes since the last clinic visit, which typically occur *** x / - most often occuring ***. The patient is *** symptomatic with these episodes, with symptoms of {symptoms; hypoglycemia:9084048}. Otherwise, the patient {HAS/HAS NOT:522402} required any recent emergency interventions for hypoglycemia and {HAS/HAS NOT:522402} had recent hospitalizations secondary to hyper or hypoglycemic episodes.    ROS: As per HPI and as detailed below: ROS    HOME DIABETES REGIMEN:    Statin: *** ACE-I/ARB: *** Prior Diabetic Education: ***   METER DOWNLOAD SUMMARY: Date range evaluated: *** Fingerstick Blood Glucose Tests =  *** Average Number Tests/Day = *** Overall Mean FS Glucose = *** Standard Deviation = ***  BG Ranges: Low = *** High = ***   Hypoglycemic Events/30 Days: BG < 50 = *** Episodes of symptomatic severe hypoglycemia = ***    DIABETIC COMPLICATIONS: Microvascular complications:   ***  Denies:   Last Eye Exam: Completed   Macrovascular complications:   ***  Denies: CAD, CVA, PVD   HISTORY:  Past Medical History:  Past Medical History:  Diagnosis Date  . Arthritis    Knees  . Diabetes mellitus without complication ([place])   . Goiter    Multinodular Goiter with partial thyroidectomey in [place]  1998.  Ultrasound confirming diagnosis in 01/17/2014    Past Surgical History:  Past Surgical History:  Procedure Laterality Date  . APPENDECTOMY  2014  . THYROID SURGERY  1998   Enlarged and painful thyroid  . THYROID SURGERY  1990     Social History:  reports that he has never smoked. He has never used smokeless tobacco. He reports that he does not drink alcohol or use drugs. Family History:  Family History  Problem Relation Age of Onset  . Arthritis Sister   . Arthritis Brother   . Prostate cancer Brother       HOME MEDICATIONS: Allergies as of 07/24/2019   No Known Allergies     Medication List       Accurate as of July 24, 2019 12:48 PM. If you have any questions, ask your nurse or doctor.  albuterol 108 (90 Base) MCG/ACT inhaler Commonly known as: VENTOLIN HFA Inhale 2 puffs into the lungs every 6 (six) hours as needed for wheezing or shortness of breath.   blood glucose meter kit and supplies Kit Check sugars twice daily, in morning when fasting and in evening before dinner   metFORMIN 500 MG 24 hr tablet Commonly known as: GLUCOPHAGE-XR 1 tab by mouth daily with dinner        OBJECTIVE:   Vital Signs: There were no vitals taken for this visit.  Wt Readings from Last 3 Encounters:  01/22/19 228 lb 9.6 oz (103.7 kg)  12/24/18  220 lb (99.8 kg)  11/13/18 216 lb 4.3 oz (98.1 kg)     Exam: General: Pt appears well and is in NAD  Hydration: Well-hydrated with moist mucous membranes and good skin turgor  HEENT: Head: Unremarkable with good dentition. Oropharynx clear without exudate.  Eyes: External eye exam normal without stare, lid lag or exophthalmos.  EOM intact.  PERRL.  Neck: General: Supple without adenopathy. Thyroid: Thyroid size normal.  No goiter or nodules appreciated. No thyroid bruit.  Lungs: Clear with good BS bilat with no rales, rhonchi, or wheezes  Heart: RRR with normal S1 and S2 and no gallops; no murmurs; no rub  Abdomen: Normoactive bowel sounds, soft, nontender, without masses or organomegaly palpable  Extremities: No pretibial edema. No tremor. Normal strength and motion throughout. See detailed diabetic foot exam below.  Skin: Normal texture and temperature to palpation. No rash noted. No Acanthosis nigricans/skin tags. No lipohypertrophy.  Neuro: MS is good with appropriate affect, pt is alert and Ox3    DM foot exam: Please see diabetic assessment flow-sheet detailed below:           DATA REVIEWED:  Lab Results  Component Value Date   HGBA1C 7.1 (H) 11/13/2018   HGBA1C 6.8 (H) 10/07/2015   Lab Results  Component Value Date   LDLCALC 100 (H) 10/07/2015   CREATININE 0.96 07/18/2019     Lab Results  Component Value Date   CHOL 167 10/07/2015   HDL 48 10/07/2015   LDLCALC 100 (H) 10/07/2015   TRIG 123 11/15/2018   CHOLHDL 3.4 03/12/2014        Thyroid Ultrasound 02/18/2019  Nodule # 1:  Location: Right; Inferior  Maximum size: 1.7 cm; Other 2 dimensions: 1.6 x 1.2 cm  Composition: mixed cystic and solid (1)  Echogenicity: isoechoic (1)  Shape: not taller-than-wide (0)  Margins: smooth (0)  Echogenic foci: none (0)  ACR TI-RADS total points: 2.  ACR TI-RADS risk category: TR2 (2 points).  ACR TI-RADS recommendations:  This nodule does NOT  meet TI-RADS criteria for biopsy or dedicated follow-up.  _________________________________________________________  Nodule # 2:  Location: Left; Mid  Maximum size: 3.6 cm; Other 2 dimensions: 2.6 x 2.3 cm  Composition: mixed cystic and solid (1)  Echogenicity: isoechoic (1)  Shape: not taller-than-wide (0)  Margins: smooth (0)  Echogenic foci: none (0)  ACR TI-RADS total points: 2.  ACR TI-RADS risk category: TR2 (2 points).  ACR TI-RADS recommendations:  This nodule does NOT meet TI-RADS criteria for biopsy or dedicated follow-up.  _________________________________________________________  Nodule # 3:  Location: Left; Inferior  Maximum size: 1.3 cm; Other 2 dimensions: 1 x 1 cm  Composition: mixed cystic and solid (1)  Echogenicity: isoechoic (1)  Shape: taller-than-wide (3)  Margins: smooth (0)  Echogenic foci: none (0)  ACR TI-RADS total points: 5.  ACR TI-RADS risk category:  TR4 (4-6 points).  ACR TI-RADS recommendations:  *Given size (>/= 1 - 1.4 cm) and appearance, a follow-up ultrasound in 1 year should be considered based on TI-RADS criteria.  _________________________________________________________  IMPRESSION: 1. Enlarged heterogenous thyroid with bilateral nodules. None meets criteria for biopsy. 2. Recommend annual/biennial ultrasound follow-up of inferior left nodules as above, until stability x5 years confirmed.    ASSESSMENT / PLAN / RECOMMENDATIONS:   1) Type 2 Diabetes Mellitus, controlled, With *** complications - Most recent A1c of *** %. Goal A1c < *** %.  ***  Plan: MEDICATIONS:  ***  EDUCATION / INSTRUCTIONS:  BG monitoring instructions: Patient is instructed to check his blood sugars *** times a day, ***.  Call [facility] if: BG persistently < 70 or > 300. . I reviewed the Rule of 15 for the treatment of hypoglycemia in detail with the patient. Literature  supplied.    2) Diabetic complications:   Eye: Does *** have known diabetic retinopathy.   Neuro/ Feet: Does *** have known diabetic peripheral neuropathy .   Renal: Patient does *** have known baseline CKD. He   is *** on an ACEI/ARB at present. Check urine albumin/creatinine ratio yearly starting at time of diagnosis. If albuminuria is positive, treatment is geared toward better glucose, blood pressure control and use of ACE inhibitors or ARBs. Monitor electrolytes and creatinine once to twice yearly.   3) Lipids: Patient is *** on a statin.    F/U in ***    Signed electronically by: Mack Guise, MD  [facility] End[facility]  [facility] [address] [place], [place] [address] Phone: [phone] FAX: [phone]   CC: [facility] [address] [place] [address] Phone: [phone]  Fax: [phone]  Return to Endocrinology clinic as below: Future Appointments  Date Time Provider [place]  07/24/2019  3:00 PM Jaclynne Baldo, Melanie Crazier, MD [facility] None

## 2019-09-09 ENCOUNTER — Encounter (HOSPITAL_COMMUNITY): Payer: Self-pay | Admitting: Emergency Medicine

## 2019-09-09 ENCOUNTER — Emergency Department (HOSPITAL_COMMUNITY)
Admission: EM | Admit: 2019-09-09 | Discharge: 2019-09-09 | Disposition: A | Discharge status: Home / Self Care | Payer: Medicaid Other | Attending: Emergency Medicine | Admitting: Emergency Medicine

## 2019-09-09 ENCOUNTER — Emergency Department (HOSPITAL_COMMUNITY): Payer: Medicaid Other

## 2019-09-09 ENCOUNTER — Other Ambulatory Visit: Payer: Self-pay

## 2019-09-09 DIAGNOSIS — E010 Iodine-deficiency related diffuse (endemic) goiter: Secondary | ICD-10-CM | POA: Insufficient documentation

## 2019-09-09 DIAGNOSIS — E119 Type 2 diabetes mellitus without complications: Secondary | ICD-10-CM | POA: Insufficient documentation

## 2019-09-09 DIAGNOSIS — Z7984 Long term (current) use of oral hypoglycemic drugs: Secondary | ICD-10-CM | POA: Insufficient documentation

## 2019-09-09 DIAGNOSIS — R221 Localized swelling, mass and lump, neck: Secondary | ICD-10-CM

## 2019-09-09 LAB — COMPREHENSIVE METABOLIC PANEL
ALT: 31 U/L (ref 0–44)
AST: 21 U/L (ref 15–41)
Albumin: 3.5 g/dL (ref 3.5–5.0)
Alkaline Phosphatase: 69 U/L (ref 38–126)
Anion gap: 10 (ref 5–15)
BUN: 12 mg/dL (ref 8–23)
CO2: 24 mmol/L (ref 22–32)
Calcium: 9.2 mg/dL (ref 8.9–10.3)
Chloride: 104 mmol/L (ref 98–111)
Creatinine, Ser: 0.71 mg/dL (ref 0.61–1.24)
GFR calc Af Amer: >60 mL/min (ref >60)
GFR calc non Af Amer: >60 mL/min (ref >60)
Glucose, Bld: 174 mg/dL — ABNORMAL HIGH (ref 70–99)
Potassium: 3.6 mmol/L (ref 3.5–5.1)
Sodium: 138 mmol/L (ref 135–145)
Total Bilirubin: 0.7 mg/dL (ref 0.3–1.2)
Total Protein: 6.7 g/dL (ref 6.5–8.1)

## 2019-09-09 LAB — CBC WITH DIFFERENTIAL/PLATELET
Abs Immature Granulocytes: 0.01 10*3/uL (ref 0.00–0.07)
Basophils Absolute: 0 10*3/uL (ref 0.0–0.1)
Basophils Relative: 0 %
Eosinophils Absolute: 0.1 10*3/uL (ref 0.0–0.5)
Eosinophils Relative: 2 %
HCT: 45 % (ref 39.0–52.0)
Hemoglobin: 14.2 g/dL (ref 13.0–17.0)
Immature Granulocytes: 0 %
Lymphocytes Relative: 38 %
Lymphs Abs: 1.9 10*3/uL (ref 0.7–4.0)
MCH: 26.9 pg (ref 26.0–34.0)
MCHC: 31.6 g/dL (ref 30.0–36.0)
MCV: 85.2 fL (ref 80.0–100.0)
Monocytes Absolute: 0.5 10*3/uL (ref 0.1–1.0)
Monocytes Relative: 11 %
Neutro Abs: 2.4 10*3/uL (ref 1.7–7.7)
Neutrophils Relative %: 49 %
Platelets: 240 10*3/uL (ref 150–400)
RBC: 5.28 MIL/uL (ref 4.22–5.81)
RDW: 13.3 % (ref 11.5–15.5)
WBC: 4.9 10*3/uL (ref 4.0–10.5)
nRBC: 0 % (ref 0.0–0.2)

## 2019-09-09 LAB — TSH: TSH: 0.576 u[IU]/mL (ref 0.350–4.500)

## 2019-09-09 NOTE — ED Provider Notes (Signed)
[facility] EMERGENCY DEPARTMENT Provider Note   CSN: [id number] Arrival date & time: 09/09/19  1007     History No chief complaint on file.   Samuel [name] is a 64 y.o. male.  Patient with history of multinodular goiter, followed by [facility] end[facility]inology, last seen 12/2018, ultrasound at that time-presents to the emergency department today with complaint of worsening swelling in the neck and difficulty swallowing.  He states that his symptoms have gradually become worse over the past 3 days.  States that he had a history of thyroid surgery in the 90s when he lived in [place].  He states that he was prescribed medication about 10 years ago in another country which made his symptoms better.  Patient denies any fevers.  He states that the swelling makes it difficult for him to turn his head entirely.  No weight changes, heat or cold intolerance, palpitations, diarrhea, constipation.  Patient states that he would like to go ahead and get his thyroid out.  The onset of this condition was acute. The course is worsening. Aggravating factors: movement. Alleviating factors: none.          Past Medical History:  Diagnosis Date  . Arthritis    Knees  . Diabetes mellitus without complication ([place])   . Goiter    Multinodular Goiter with partial thyroidectomey in [place]  1998.  Ultrasound confirming diagnosis in 01/17/2014    Patient Active Problem List   Diagnosis Date Noted  . COVID-19 virus infection 11/13/2018  . Acute on chronic respiratory failure with hypoxia ([place]) 11/13/2018  . Type 2 diabetes mellitus ([place]) 11/13/2018  . Unspecified vitamin D deficiency 03/20/2014  . Dental cavities 03/20/2014  . IFG (impaired fasting glucose) 02/26/2014  . Multinodular goiter 02/26/2014  . Goiter     Past Surgical History:  Procedure Laterality Date  . APPENDECTOMY  2014  . THYROID SURGERY  1998   Enlarged and painful thyroid  . THYROID SURGERY  1990       Family History    Problem Relation Age of Onset  . Arthritis Sister   . Arthritis Brother   . Prostate cancer Brother     Social History   Tobacco Use  . Smoking status: Never Smoker  . Smokeless tobacco: Never Used  Substance Use Topics  . Alcohol use: No    Alcohol/week: 0.0 standard drinks  . Drug use: No    Home Medications Prior to Admission medications   Medication Sig Start Date End Date Taking? Authorizing Provider  albuterol (VENTOLIN HFA) 108 (90 Base) MCG/ACT inhaler Inhale 2 puffs into the lungs every 6 (six) hours as needed for wheezing or shortness of breath. 11/16/18   Thurnell Lose, MD  blood glucose meter kit and supplies KIT Check sugars twice daily, in morning when fasting and in evening before dinner 02/08/16   Mack Hook, MD  metFORMIN (GLUCOPHAGE-XR) 500 MG 24 hr tablet 1 tab by mouth daily with dinner 07/18/19   Volanda Napoleon, PA-C    Allergies    Patient has no known allergies.  Review of Systems   Review of Systems  Constitutional: Negative for fever.  HENT: Negative for rhinorrhea and sore throat.        + neck swelling  Eyes: Negative for redness.  Respiratory: Negative for shortness of breath.   Cardiovascular: Negative for chest pain.  Gastrointestinal: Negative for abdominal pain, constipation, diarrhea, nausea and vomiting.  Endocrine: Negative for cold intolerance and heat intolerance.  Genitourinary: Negative for dysuria.  Musculoskeletal: Negative for myalgias and neck pain.  Skin: Negative for rash.  Neurological: Negative for headaches.    Physical Exam Updated Vital Signs BP 127/82 (BP Location: Right Arm)   Pulse 64   Temp 98.5 F (36.9 C) (Oral)   Resp 14   SpO2 99%   Physical Exam Vitals and nursing note reviewed.  Constitutional:      Appearance: He is well-developed.  HENT:     Head: Normocephalic and atraumatic.     Right Ear: Tympanic membrane, ear canal and external ear normal.     Left Ear: Tympanic membrane, ear  canal and external ear normal.  Eyes:     General:        Right eye: No discharge.        Left eye: No discharge.     Conjunctiva/sclera: Conjunctivae normal.  Neck:     Thyroid: Thyromegaly present. No thyroid mass or thyroid tenderness.     Comments: Patient with moderately diffusely enlarged goiter.  No overlying erythema.  No point tenderness. Cardiovascular:     Rate and Rhythm: Normal rate and regular rhythm.     Heart sounds: Normal heart sounds.  Pulmonary:     Effort: Pulmonary effort is normal.     Breath sounds: Normal breath sounds.  Abdominal:     Palpations: Abdomen is soft.     Tenderness: There is no abdominal tenderness.  Musculoskeletal:     Cervical back: Normal range of motion and neck supple.  Skin:    General: Skin is warm and dry.  Neurological:     Mental Status: He is alert.     ED Results / Procedures / Treatments   Labs (all labs ordered are listed, but only abnormal results are displayed) Labs Reviewed  COMPREHENSIVE METABOLIC PANEL - Abnormal; Notable for the following components:      Result Value   Glucose, Bld 174 (*)    All other components within normal limits  TSH  CBC WITH DIFFERENTIAL/PLATELET    EKG None  Radiology US THYROID  Result Date: 09/09/2019 CLINICAL DATA:  Goiter.  Neck pain. EXAM: THYROID ULTRASOUND TECHNIQUE: Ultrasound examination of the thyroid gland and adjacent soft tissues was performed. COMPARISON:  02/18/2019 and 01/17/2014 FINDINGS: Parenchymal Echotexture: Moderately heterogenous Isthmus: 1.5 cm Right lobe: 7.0 x 4.3 x 3.7 cm Left lobe: 7.4 x 4.2 x 4.0 cm _________________________________________________________ Estimated total number of nodules >/= 1 cm: 3 Number of spongiform nodules >/=  2 cm not described below (TR1): 0 Number of mixed cystic and solid nodules >/= 1.5 cm not described below ([place]): 0 _________________________________________________________ Nodule # 3: Prior biopsy: No Location: Left; Inferior  Maximum size: 1.3 cm; Other 2 dimensions: 1.3 x 1.0 cm, previously, 1.3 cm Composition: mixed cystic and solid (1) Echogenicity: isoechoic (1) Shape: not taller-than-wide (0) Margins: smooth (0) Echogenic foci: none (0) ACR TI-RADS total points: 2. ACR TI-RADS risk category:  TR2 (2 points). Significant change in size (>/= 20% in two dimensions and minimal increase of 2 mm): No Change in features: No Change in ACR TI-RADS risk category: No ACR TI-RADS recommendations: This nodule does NOT meet TI-RADS criteria for biopsy or dedicated follow-up. Previously this nodule met criteria for 1 year follow-up based on taller than wide shape. Clearly on the current study the nodule is not taller than wide. _________________________________________________________ Right inferior and left mid solid/cystic nodules appears stable and continue to not meet criteria for biopsy or dedicated follow-up.  IMPRESSION: The 1.3 cm left inferior thyroid nodule no longer meets criteria for follow-up ultrasound. This nodule is clearly not taller than wide and now does not meet criteria for biopsy or dedicated follow-up. Two other previously identified partially solid and partially cystic nodules in the right inferior and left mid thyroid are stable and do not meet criteria for biopsy or dedicated follow-up. The above is in keeping with the ACR TI-RADS recommendations - J Am Coll Radiol 2017;14:587-595. Electronically Signed   By: Aletta Edouard M.D.   On: 09/09/2019 15:50    Procedures Procedures (including critical care time)  Medications Ordered in ED Medications - No data to display  ED Course  I have reviewed the triage vital signs and the nursing notes.  Pertinent labs & imaging results that were available during my care of the patient were reviewed by me and considered in my medical decision making (see chart for details).  Patient seen and examined.  This seems to be a recurrent problem for the patient.  It appears that  he was supposed to follow-up with endocrinology in about December.  Given that he states his symptoms have been worsening over the past few days, feel that it is reasonable to check lab work, TSH, and repeat ultrasound of the thyroid to ensure stability.  Discussed with patient that he will not be able to have his thyroid out here.  Discussed that he will need to follow-up with his PCP/endocrinologist for further management.  Vital signs reviewed and are as follows: BP 127/82 (BP Location: Right Arm)   Pulse 64   Temp 98.5 F (36.9 C) (Oral)   Resp 14   SpO2 99%   12:55 PM patient in ultrasound.  Upon further review of patient's records, I can now see another endocrinology visit just 4 days ago through [facility].  It looks like the patient's PCP had performed another ultrasound about a month ago.  It looks as though Dr. Hartford Poli, have recommended surgical consultation however per note, patient was not enthusiastic about this.  We will follow up on results today, although I think it is unlikely that this is changed from his previous ultrasound.  Will likely refer patient back to his primary care doctor.  4:08 PM discussed results with patient.  Discussed no emergent conditions noted today.  Discussed that if he would like to follow-up with a surgeon to discuss any surgical treatments which may be indicated, I am happy to give ENT referral.  Referral given.  Patient courage to return with worsening symptoms, swelling, trouble breathing or swallowing, new symptoms or other concerns.    MDM Rules/Calculators/A&P                      Patient with thyromegaly.  This seems to be bothersome to the patient.  No signs of thyrotoxicosis or low thyroid.  TSH is normal.  Repeat ultrasound demonstrates stability of thyroid findings.  No indications for emergent admission or treatment.  Patient encouraged to follow-up with his endocrinologist.  ENT referral given.  Final Clinical Impression(s) / ED  Diagnoses Final diagnoses:  Neck swelling  Thyromegaly    Rx / DC Orders ED Discharge Orders    None       Carlisle Cater, PA-C 09/09/19 1609    Malvin Johns, MD 09/10/19 [phone]

## 2019-09-09 NOTE — ED Triage Notes (Signed)
Pt in with c/o thyroid problem, neck swelling x few wks. States he is having trouble lying down and breathing, as well as swallowing.

## 2019-09-09 NOTE — Discharge Instructions (Signed)
Your thyroid function testing continues to be normal today.  Your ultrasound shows stable enlargement of the thyroid and associated cysts.  There are no infections or emergencies found today.  I encourage you to follow-up with the ENT doctor, thyroid surgeon, to discuss if there are any surgical procedures which could benefit you.  Please return to the emergency department with any worsening symptoms, fever, trouble swallowing or breathing, or other concerns.

## 2019-09-09 NOTE — ED Notes (Signed)
Patient verbalizes understanding of discharge instructions. Opportunity for questioning and answers were provided. Armband removed by staff, pt discharged from ED ambulatory to home.  

## 2019-09-17 DIAGNOSIS — J988 Other specified respiratory disorders: Secondary | ICD-10-CM | POA: Insufficient documentation

## 2019-09-24 ENCOUNTER — Other Ambulatory Visit: Payer: Self-pay | Admitting: Otolaryngology

## 2019-09-24 DIAGNOSIS — E049 Nontoxic goiter, unspecified: Secondary | ICD-10-CM

## 2019-09-24 DIAGNOSIS — J988 Other specified respiratory disorders: Secondary | ICD-10-CM

## 2019-10-04 ENCOUNTER — Ambulatory Visit
Admission: RE | Admit: 2019-10-04 | Discharge: 2019-10-04 | Disposition: A | Discharge status: Home / Self Care | Payer: Medicaid Other | Source: Ambulatory Visit | Attending: Otolaryngology | Admitting: Otolaryngology

## 2019-10-04 DIAGNOSIS — J988 Other specified respiratory disorders: Secondary | ICD-10-CM

## 2019-10-04 DIAGNOSIS — E049 Nontoxic goiter, unspecified: Secondary | ICD-10-CM

## 2019-10-04 MED ORDER — IOPAMIDOL (ISOVUE-300) INJECTION 61%
75.0000 mL | Freq: Once | INTRAVENOUS | Status: AC | PRN
  Administered 2019-10-04: 75 mL via INTRAVENOUS

## 2019-11-13 ENCOUNTER — Emergency Department (HOSPITAL_COMMUNITY): Payer: Medicaid Other

## 2019-11-13 ENCOUNTER — Other Ambulatory Visit: Payer: Self-pay

## 2019-11-13 ENCOUNTER — Emergency Department (HOSPITAL_COMMUNITY)
Admission: EM | Admit: 2019-11-13 | Discharge: 2019-11-13 | Disposition: A | Discharge status: Home / Self Care | Payer: Medicaid Other | Attending: Emergency Medicine | Admitting: Emergency Medicine

## 2019-11-13 ENCOUNTER — Encounter (HOSPITAL_COMMUNITY): Payer: Self-pay | Admitting: Emergency Medicine

## 2019-11-13 DIAGNOSIS — Z7984 Long term (current) use of oral hypoglycemic drugs: Secondary | ICD-10-CM | POA: Insufficient documentation

## 2019-11-13 DIAGNOSIS — R079 Chest pain, unspecified: Secondary | ICD-10-CM | POA: Insufficient documentation

## 2019-11-13 DIAGNOSIS — M546 Pain in thoracic spine: Secondary | ICD-10-CM | POA: Diagnosis not present

## 2019-11-13 DIAGNOSIS — E119 Type 2 diabetes mellitus without complications: Secondary | ICD-10-CM | POA: Diagnosis not present

## 2019-11-13 LAB — CBC
HCT: 44.3 % (ref 39.0–52.0)
Hemoglobin: 13.8 g/dL (ref 13.0–17.0)
MCH: 26.6 pg (ref 26.0–34.0)
MCHC: 31.2 g/dL (ref 30.0–36.0)
MCV: 85.5 fL (ref 80.0–100.0)
Platelets: 294 10*3/uL (ref 150–400)
RBC: 5.18 MIL/uL (ref 4.22–5.81)
RDW: 13.9 % (ref 11.5–15.5)
WBC: 5.3 10*3/uL (ref 4.0–10.5)
nRBC: 0 % (ref 0.0–0.2)

## 2019-11-13 LAB — TROPONIN I (HIGH SENSITIVITY): Troponin I (High Sensitivity): 3 ng/L (ref <18)

## 2019-11-13 MED ORDER — SODIUM CHLORIDE 0.9% FLUSH: 3.0000 mL | Freq: Once | INTRAVENOUS | Status: DC

## 2019-11-13 MED ORDER — LIDOCAINE VISCOUS HCL 2 % MT SOLN
15.0000 mL | Freq: Once | OROMUCOSAL | Status: AC
  Administered 2019-11-13: 15 mL via ORAL
  Filled 2019-11-13: qty 15

## 2019-11-13 MED ORDER — ALUM & MAG HYDROXIDE-SIMETH 200-200-20 MG/5ML PO SUSP
30.0000 mL | Freq: Once | ORAL | Status: AC
  Administered 2019-11-13: 30 mL via ORAL
  Filled 2019-11-13: qty 30

## 2019-11-13 MED ORDER — PANTOPRAZOLE SODIUM 20 MG PO TBEC: 20.0000 mg | DELAYED_RELEASE_TABLET | Freq: Every day | ORAL | 0 refills | Status: DC

## 2019-11-13 NOTE — ED Triage Notes (Addendum)
Pt reports upper back and central chest pain x3 days, denies sob. States pain mostly occurs when he is lying down.

## 2019-11-13 NOTE — ED Notes (Signed)
Patient verbalizes understanding of discharge instructions. Opportunity for questioning and answers were provided. Armband removed by staff, pt discharged from ED ambulatory.   

## 2019-11-15 NOTE — ED Provider Notes (Signed)
[facility] EMERGENCY DEPARTMENT Provider Note   CSN: [id number] Arrival date & time: 11/13/19  4782     History Chief Complaint  Patient presents with  . Chest Pain    Samuel [name] is a 64 y.o. male.  HPI   64 year old male with mid upper back pain.  Gradual onset about 3 days ago.  Pretty persistent.  Radiates around to his chest.  Denies any associated symptoms such as dyspnea, nausea or diaphoresis.  Pain seems to be worse when laying down.  Somewhat improved with sitting up.  No fevers or chills.  No unusual leg pain or swelling.  Past Medical History:  Diagnosis Date  . Arthritis    Knees  . Diabetes mellitus without complication ([place])   . Goiter    Multinodular Goiter with partial thyroidectomey in [place]  1998.  Ultrasound confirming diagnosis in 01/17/2014    Patient Active Problem List   Diagnosis Date Noted  . COVID-19 virus infection 11/13/2018  . Acute on chronic respiratory failure with hypoxia ([place]) 11/13/2018  . Type 2 diabetes mellitus ([place]) 11/13/2018  . Unspecified vitamin D deficiency 03/20/2014  . Dental cavities 03/20/2014  . IFG (impaired fasting glucose) 02/26/2014  . Multinodular goiter 02/26/2014  . Goiter     Past Surgical History:  Procedure Laterality Date  . APPENDECTOMY  2014  . THYROID SURGERY  1998   Enlarged and painful thyroid  . THYROID SURGERY  1990       Family History  Problem Relation Age of Onset  . Arthritis Sister   . Arthritis Brother   . Prostate cancer Brother     Social History   Tobacco Use  . Smoking status: Never Smoker  . Smokeless tobacco: Never Used  Substance Use Topics  . Alcohol use: No    Alcohol/week: 0.0 standard drinks  . Drug use: No    Home Medications Prior to Admission medications   Medication Sig Start Date End Date Taking? Authorizing Provider  albuterol (VENTOLIN HFA) 108 (90 Base) MCG/ACT inhaler Inhale 2 puffs into the lungs every 6 (six) hours as needed for  wheezing or shortness of breath. 11/16/18   Thurnell Lose, MD  blood glucose meter kit and supplies KIT Check sugars twice daily, in morning when fasting and in evening before dinner 02/08/16   Mack Hook, MD  metFORMIN (GLUCOPHAGE-XR) 500 MG 24 hr tablet 1 tab by mouth daily with dinner 07/18/19   Providence Lanius A, PA-C  pantoprazole (PROTONIX) 20 MG tablet Take 1 tablet (20 mg total) by mouth daily. 11/13/19   Virgel Manifold, MD    Allergies    Patient has no known allergies.  Review of Systems   Review of Systems All systems reviewed and negative, other than as noted in HPI.  Physical Exam Updated Vital Signs BP 125/78   Pulse 64   Temp 98.2 F (36.8 C) (Oral)   Resp 19   Ht 6' (1.829 m)   Wt 104.3 kg   SpO2 97%   BMI 31.19 kg/m   Physical Exam Vitals and nursing note reviewed.  Constitutional:      General: He is not in acute distress.    Appearance: He is well-developed.  HENT:     Head: Normocephalic and atraumatic.  Eyes:     General:        Right eye: No discharge.        Left eye: No discharge.     Conjunctiva/sclera: Conjunctivae normal.  Cardiovascular:     Rate and Rhythm: Normal rate and regular rhythm.     Heart sounds: Normal heart sounds. No murmur. No friction rub. No gallop.   Pulmonary:     Effort: Pulmonary effort is normal. No respiratory distress.     Breath sounds: Normal breath sounds.  Abdominal:     General: There is no distension.     Palpations: Abdomen is soft.     Tenderness: There is no abdominal tenderness.  Musculoskeletal:        General: No tenderness.     Cervical back: Neck supple.  Skin:    General: Skin is warm and dry.  Neurological:     Mental Status: He is alert.  Psychiatric:        Behavior: Behavior normal.        Thought Content: Thought content normal.     ED Results / Procedures / Treatments   Labs (all labs ordered are listed, but only abnormal results are displayed) Labs Reviewed  CBC    TROPONIN I (HIGH SENSITIVITY)    EKG EKG Interpretation  Date/Time:  Wednesday November 13 2019 09:49:34 EDT Ventricular Rate:  63 PR Interval:  202 QRS Duration: 76 QT Interval:  384 QTC Calculation: 392 R Axis:   53 Text Interpretation: Normal sinus rhythm Low voltage QRS Septal infarct , age undetermined Abnormal ECG Confirmed by Virgel Manifold [phone]) on 11/13/2019 11:00:15 AM   Radiology No results found.   DG Chest 2 View  Result Date: 11/13/2019 CLINICAL DATA:  Back and chest pain for 3 days. Shortness of breath. EXAM: CHEST - 2 VIEW COMPARISON:  12/24/2018 FINDINGS: The cardiac silhouette, mediastinal and hilar contours are within normal limits and stable. Minimal streaky basilar atelectasis but no infiltrates, edema or effusions. No pulmonary lesions. The bony thorax is intact. IMPRESSION: Minimal streaky basilar atelectasis but no infiltrates, edema or effusions. Electronically Signed   By: Marijo Sanes M.D.   On: 11/13/2019 10:42    Procedures Procedures (including critical care time)  Medications Ordered in ED Medications  alum & mag hydroxide-simeth (MAALOX/MYLANTA) 200-200-20 MG/5ML suspension 30 mL (30 mLs Oral Given 11/13/19 1212)    And  lidocaine (XYLOCAINE) 2 % viscous mouth solution 15 mL (15 mLs Oral Given 11/13/19 1212)    ED Course  I have reviewed the triage vital signs and the nursing notes.  Pertinent labs & imaging results that were available during my care of the patient were reviewed by me and considered in my medical decision making (see chart for details).    MDM Rules/Calculators/A&P                      64 year old male with back and chest pain.  Suspect musculoskeletal etiology.  Doubt ACS, PE, dissection or other emergent process.  Potentially pericarditis given positional component.  Even if so, does not appear to have any high risk features.  May potentially also be reflux/PUD.  Plan to trial PPI.  Plan symptomatic treatment.  Return  precautions discussed.  Outpatient follow-up otherwise.  Final Clinical Impression(s) / ED Diagnoses Final diagnoses:  Nonspecific chest pain  Thoracic back pain, unspecified back pain laterality, unspecified chronicity    Rx / DC Orders ED Discharge Orders         Ordered    pantoprazole (PROTONIX) 20 MG tablet  Daily     11/13/19 1353           Virgel Manifold, MD 11/15/19 1147

## 2019-12-01 IMAGING — DX DG KNEE COMPLETE 4+V*L*
4 series · 4 of 4 positions shown · non-contrast
Comparison: Radiographs November 19, 2014.

CLINICAL DATA: Bilateral knee pain without reported injury.

EXAM:
LEFT KNEE - COMPLETE 4+ VIEW

[knee ap]
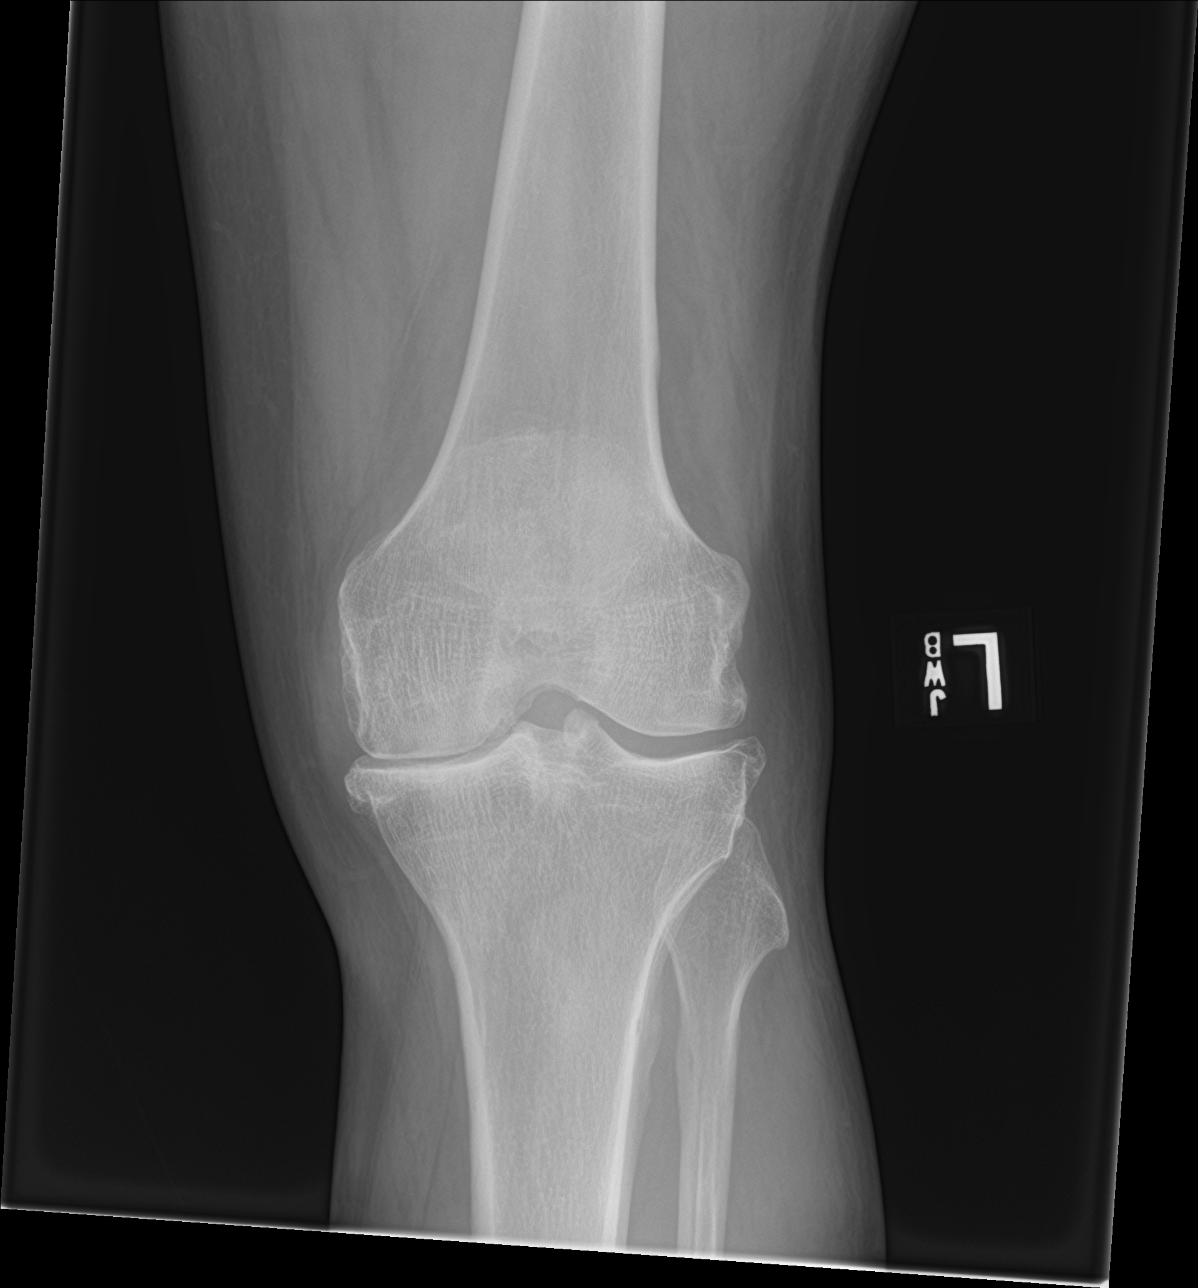

[knee lat]
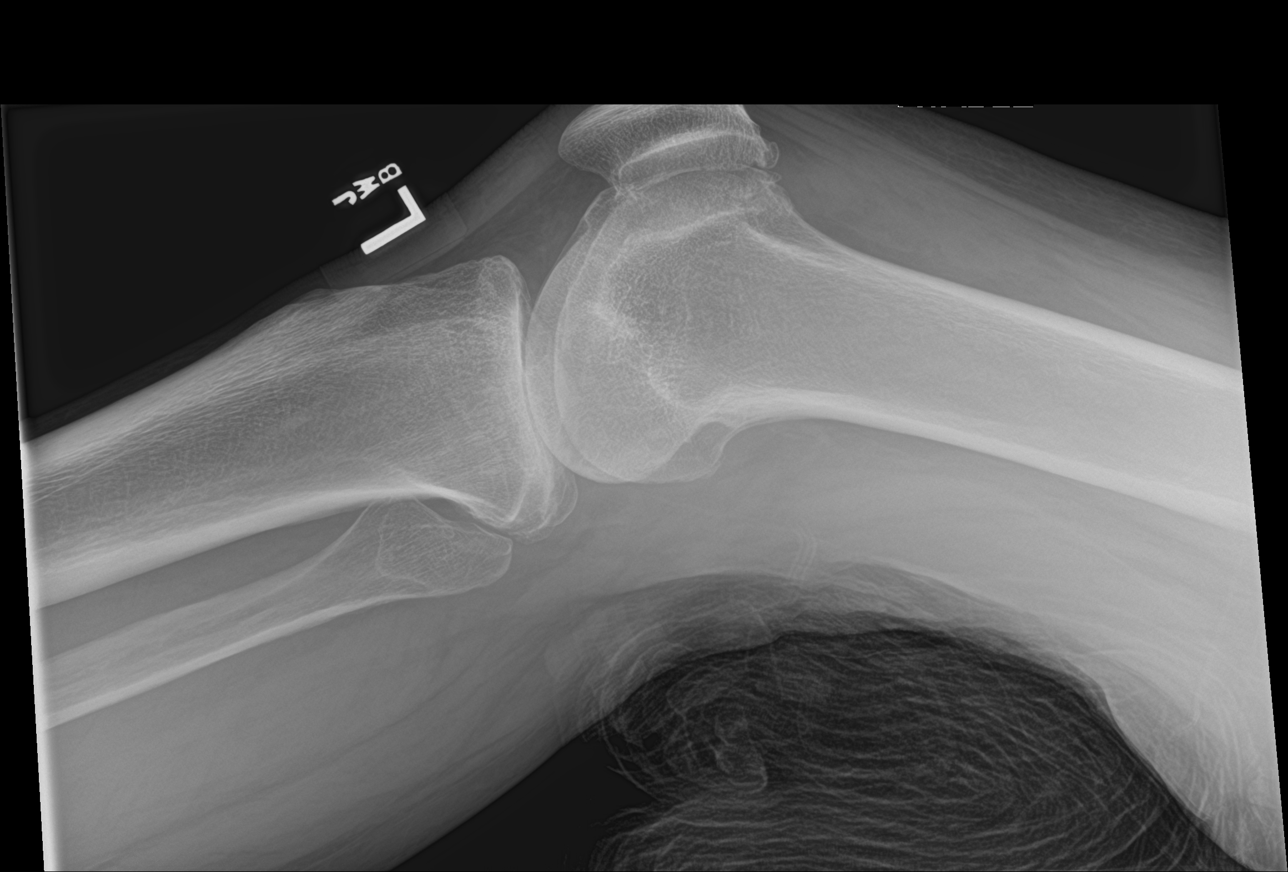

[knee obl (1 of 2)]
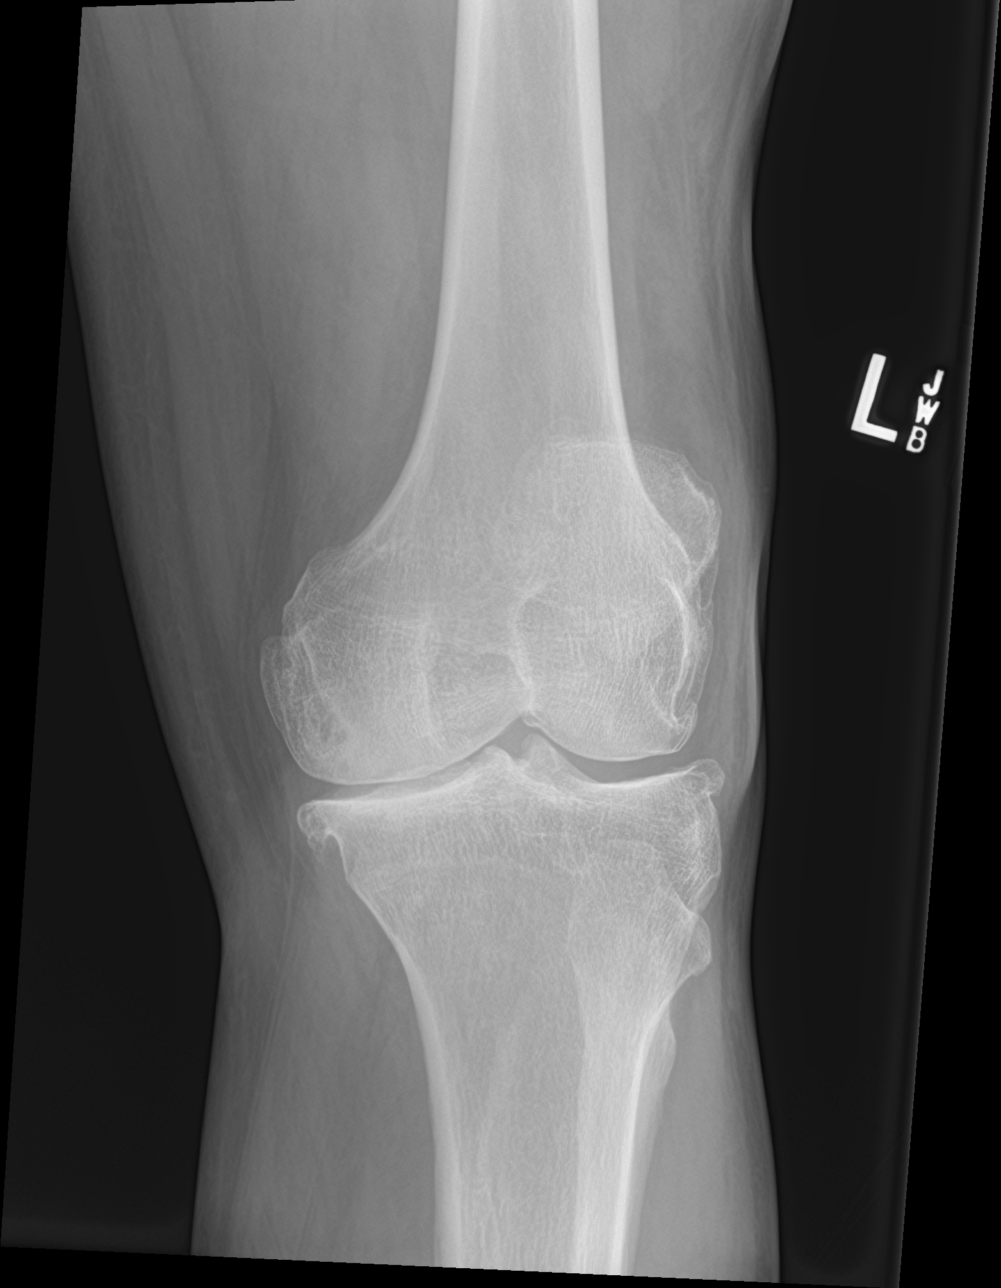

[knee obl (2 of 2)]
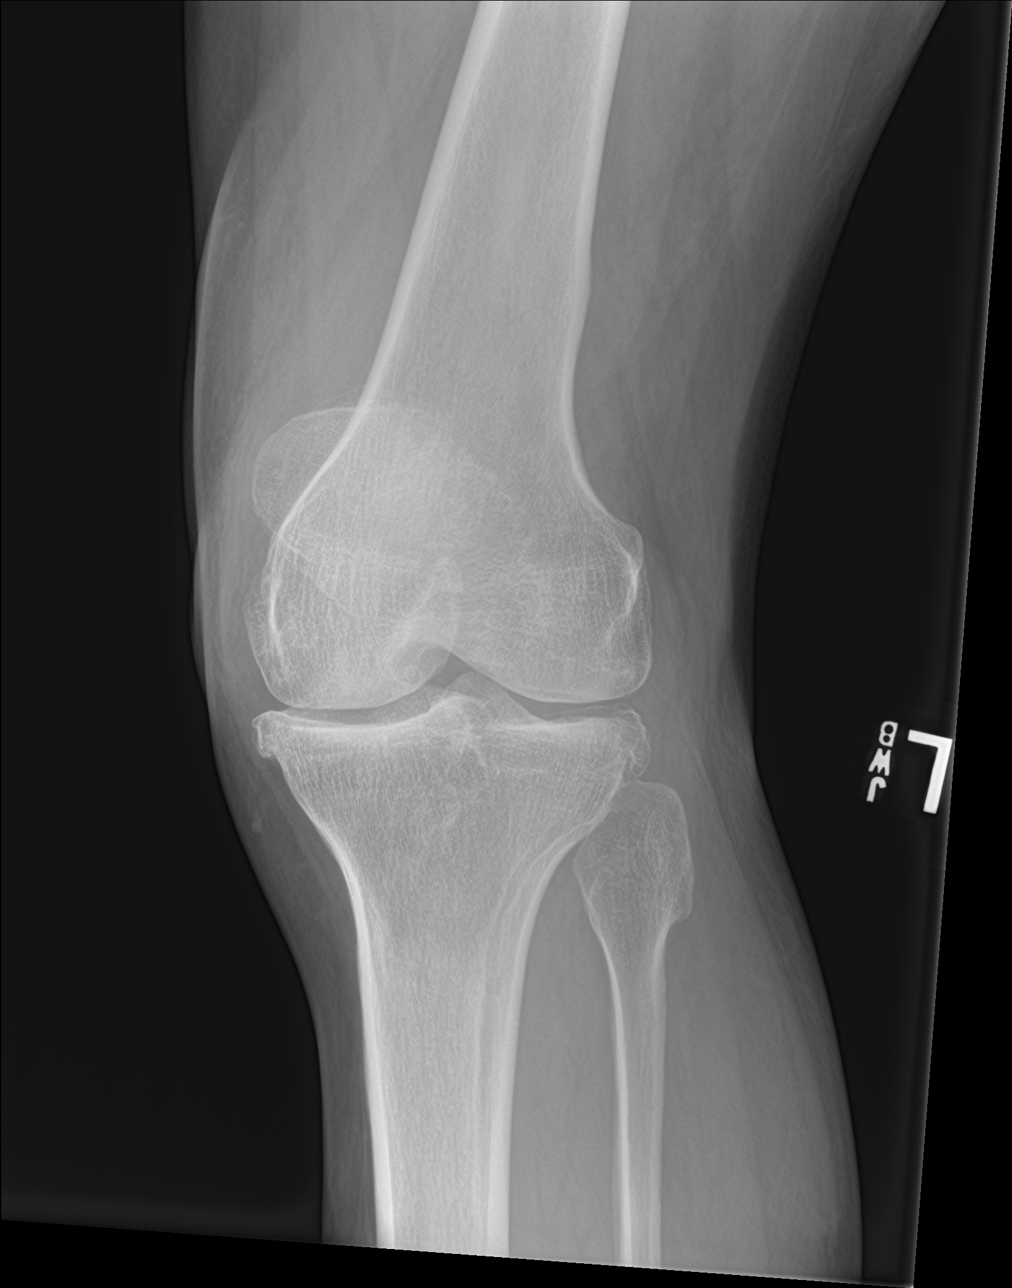

[4 of 4 positions shown; findings below may reference images not displayed]

FINDINGS: No evidence of fracture, dislocation, or joint effusion. Severe
narrowing of medial joint space is noted. Osteophyte formation is
noted laterally.. Soft tissues are unremarkable.
IMPRESSION: Severe degenerative joint disease medially. No acute abnormality
seen in the left knee.

## 2019-12-01 IMAGING — DX DG KNEE COMPLETE 4+V*R*
4 series · 4 of 4 positions shown · non-contrast
Comparison: Radiographs April 14, 2017.

CLINICAL DATA: Right knee pain without reported injury.

EXAM:
RIGHT KNEE - COMPLETE 4+ VIEW

[knee ap]
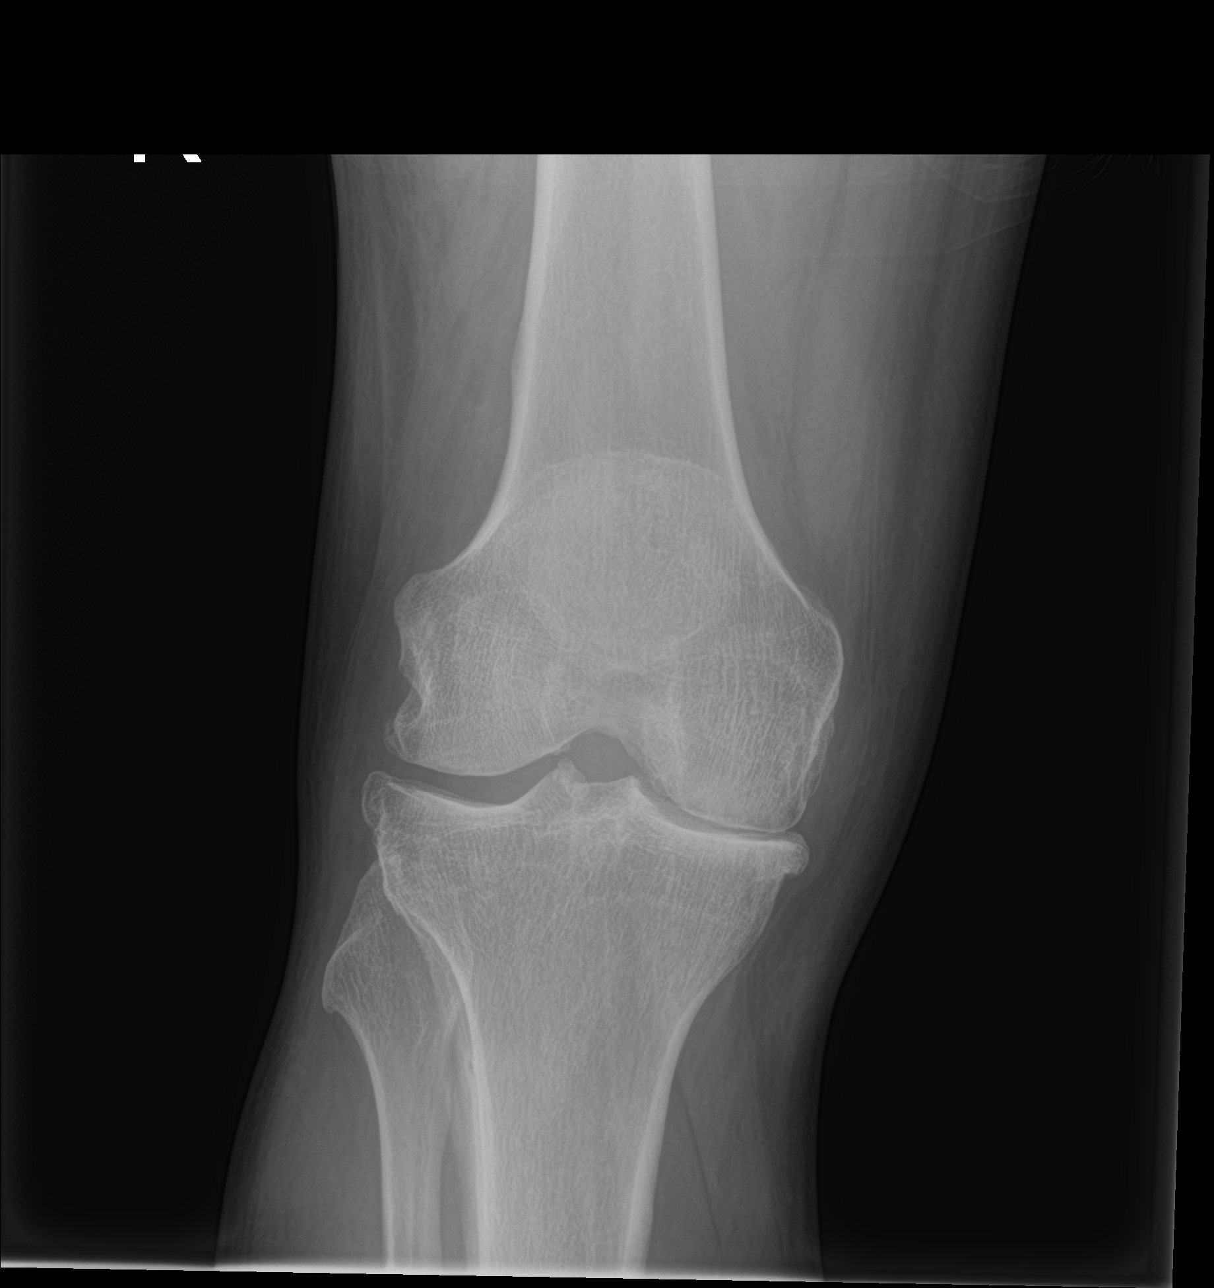

[tunnel]
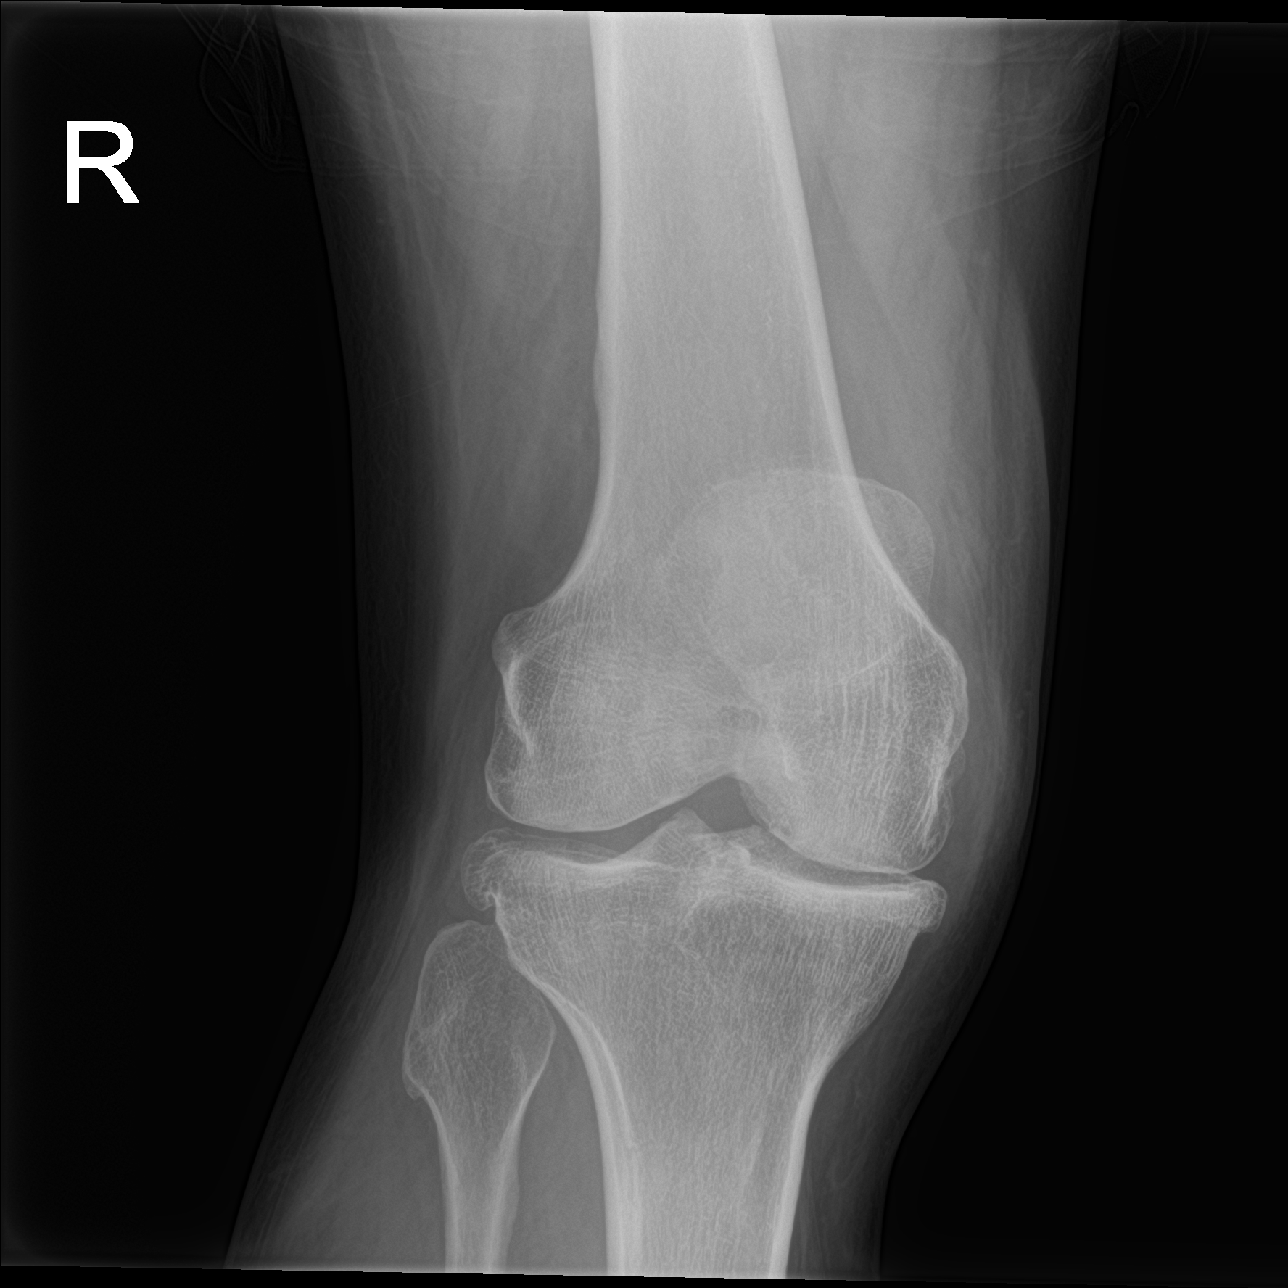

[knee lat]
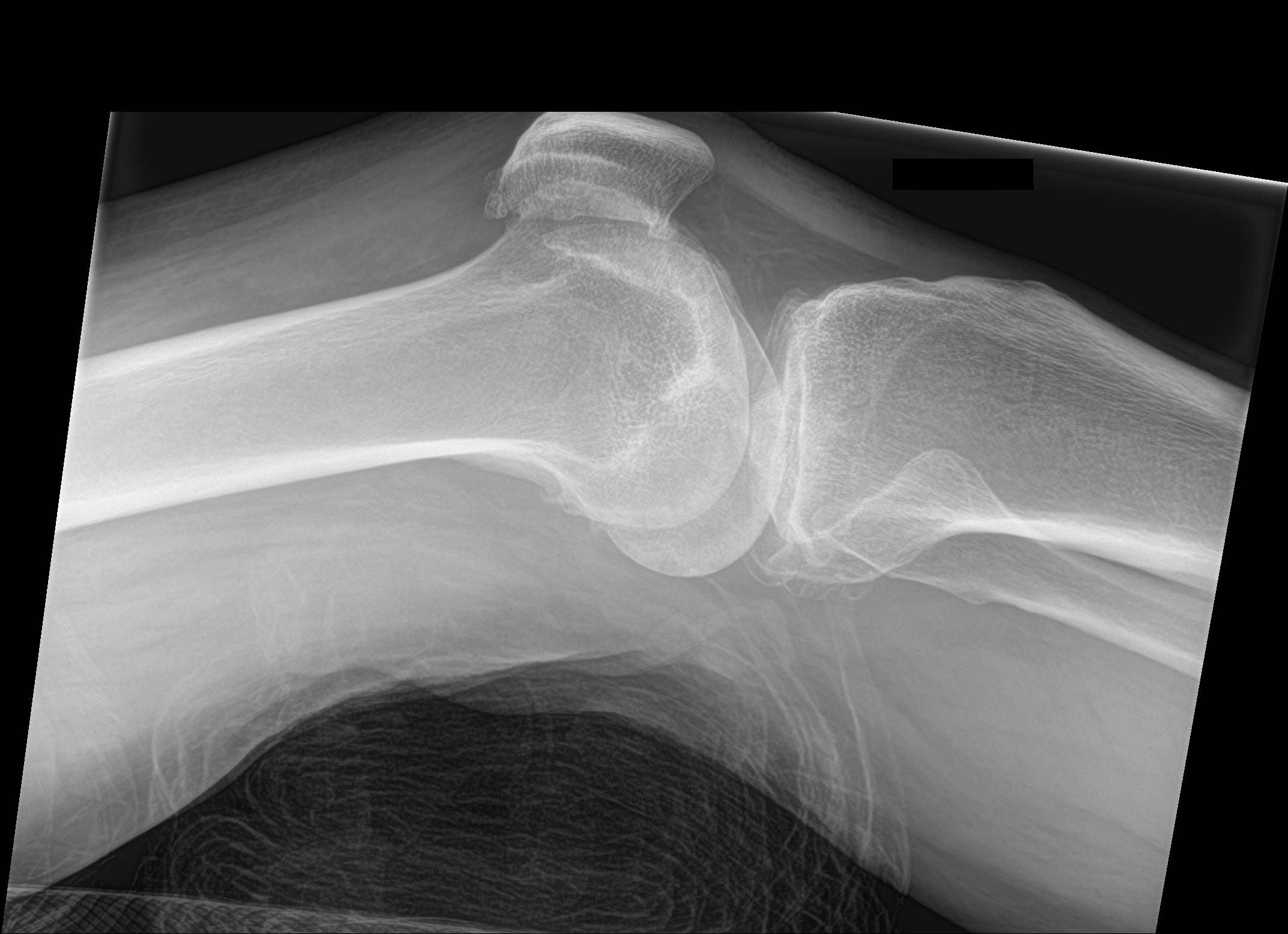

[knee obl]
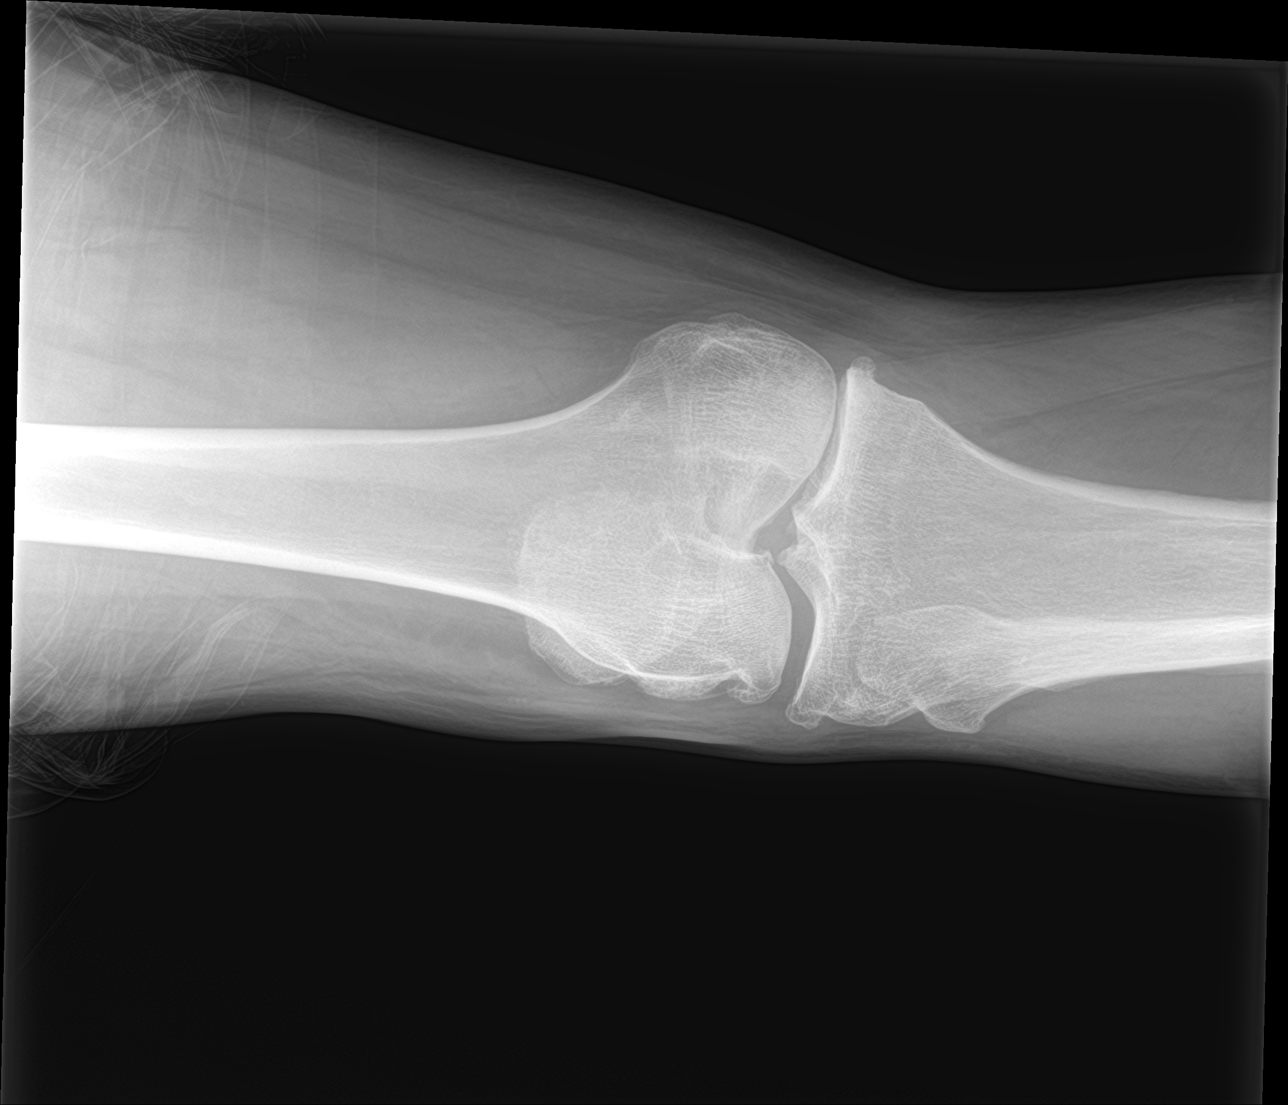

[4 of 4 positions shown; findings below may reference images not displayed]

FINDINGS: No evidence of fracture, dislocation, or joint effusion. Moderate
narrowing of medial joint space is noted. Osteophyte formation is
noted laterally. Soft tissues are unremarkable.
IMPRESSION: Moderate degenerative joint disease is noted medially. No acute
abnormality seen in the right knee.

## 2019-12-25 ENCOUNTER — Encounter (HOSPITAL_COMMUNITY): Payer: Self-pay | Admitting: Emergency Medicine

## 2019-12-25 ENCOUNTER — Emergency Department (HOSPITAL_COMMUNITY)
Admission: EM | Admit: 2019-12-25 | Discharge: 2019-12-25 | Disposition: A | Discharge status: Home / Self Care | Payer: Medicaid Other | Attending: Emergency Medicine | Admitting: Emergency Medicine

## 2019-12-25 DIAGNOSIS — E119 Type 2 diabetes mellitus without complications: Secondary | ICD-10-CM | POA: Diagnosis not present

## 2019-12-25 DIAGNOSIS — M1990 Unspecified osteoarthritis, unspecified site: Secondary | ICD-10-CM | POA: Diagnosis not present

## 2019-12-25 DIAGNOSIS — R2232 Localized swelling, mass and lump, left upper limb: Secondary | ICD-10-CM | POA: Diagnosis present

## 2019-12-25 DIAGNOSIS — Q385 Congenital malformations of palate, not elsewhere classified: Secondary | ICD-10-CM

## 2019-12-25 DIAGNOSIS — L853 Xerosis cutis: Secondary | ICD-10-CM | POA: Insufficient documentation

## 2019-12-25 LAB — CBG MONITORING, ED: Glucose-Capillary: 91 mg/dL (ref 70–99)

## 2019-12-25 MED ORDER — COCOA BUTTER CREA: TOPICAL_CREAM | 0 refills | Status: DC

## 2019-12-25 NOTE — Discharge Instructions (Addendum)
You were evaluated in the Emergency Department and after careful evaluation, we did not find any emergent condition requiring admission or further testing in the hospital.  Your exam/testing today is overall reassuring.  Your blood sugar today was normal.  Use the cream provided as directed.  Drink more water at home.  Follow-up with the ear nose and throat specialist for the nodule in your mouth.  Please return to the Emergency Department if you experience any worsening of your condition.  We encourage you to follow up with a primary care provider.  Thank you for allowing [place] to be a part of your care.

## 2019-12-25 NOTE — ED Provider Notes (Signed)
[facility] Emergency Department Provider Note MRN:  [id number]  Arrival date & time: 12/25/19     Chief Complaint   Hand Pain   History of Present Illness   Samuel [name] is a 64 y.o. year-old male with a history of diabetes presenting to the ED with chief complaint of multiple complaints.  Patient is having many years of hand soreness as well as dry skin.  Also with months to years of nodule to the roof of the mouth.  Also feeling like he has dry mouth for the past several days.  Drinks water but mouth is still dry.  Denies any pain to the chest, no shortness of breath, no abdominal pain, no headache or vision change, no numbness or weakness.  Review of Systems  A complete 10 system review of systems was obtained and all systems are negative except as noted in the HPI and PMH.   Patient's Health History    Past Medical History:  Diagnosis Date  . Arthritis    Knees  . Diabetes mellitus without complication ([place])   . Goiter    Multinodular Goiter with partial thyroidectomey in [place]  1998.  Ultrasound confirming diagnosis in 01/17/2014    Past Surgical History:  Procedure Laterality Date  . APPENDECTOMY  2014  . THYROID SURGERY  1998   Enlarged and painful thyroid  . THYROID SURGERY  1990    Family History  Problem Relation Age of Onset  . Arthritis Sister   . Arthritis Brother   . Prostate cancer Brother     Social History   Socioeconomic History  . Marital status: Married    Spouse name: Magda  . Number of children: 5  . Years of education: Not on file  . Highest education level: Not on file  Occupational History  . Occupation: unemployed currently  Tobacco Use  . Smoking status: Never Smoker  . Smokeless tobacco: Never Used  Substance and Sexual Activity  . Alcohol use: No    Alcohol/week: 0.0 standard drinks  . Drug use: No  . Sexual activity: Not on file  Other Topics Concern  . Not on file  Social History Narrative   Originally from [place]   Came to [employer]. 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   First language is Arabic.   Social Determinants of Health   Financial Resource Strain:   . Difficulty of Paying Living Expenses:   Food Insecurity:   . Worried About Charity fundraiser in the Last Year:   . Arboriculturist in the Last Year:   Transportation Needs:   . Film/video editor (Medical):   Marland Kitchen Lack of Transportation (Non-Medical):   Physical Activity:   . Days of Exercise per Week:   . Minutes of Exercise per Session:   Stress:   . Feeling of Stress :   Social Connections:   . Frequency of Communication with Friends and Family:   . Frequency of Social Gatherings with Friends and Family:   . Attends Religious Services:   . Active Member of Clubs or Organizations:   . Attends Archivist Meetings:   Marland Kitchen Marital Status:   Intimate Partner Violence:   . Fear of Current or Ex-Partner:   . Emotionally Abused:   Marland Kitchen Physically Abused:   . Sexually Abused:      Physical  Exam   Vitals:   12/25/19 1029  BP: (!) 136/92  Pulse: 94  Resp: 16  Temp: 98 F (36.7 C)  SpO2: 96%    CONSTITUTIONAL: Well-appearing, NAD NEURO:  Alert and oriented x 3, no focal deficits EYES:  eyes equal and reactive ENT/NECK:  no LAD, no JVD CARDIO: Regular rate, well-perfused, normal S1 and S2 PULM:  CTAB no wheezing or rhonchi GI/GU:  normal bowel sounds, non-distended, non-tender MSK/SPINE:  No gross deformities, no edema, normal range of motion of all joints SKIN: Dry skin to hands bilaterally PSYCH:  Appropriate speech and behavior  *Additional and/or pertinent findings included in MDM below  Diagnostic and Interventional Summary    EKG Interpretation  Date/Time:    Ventricular Rate:    PR Interval:    QRS Duration:   QT Interval:    QTC Calculation:   R Axis:     Text Interpretation:        Labs  Reviewed  CBG MONITORING, ED    No orders to display    Medications - No data to display   Procedures  /  Critical Care Procedures  ED Course and Medical Decision Making  I have reviewed the triage vital signs, the nursing notes, and pertinent available records from the EMR.  Listed above are laboratory and imaging tests that I personally ordered, reviewed, and interpreted and then considered in my medical decision making (see below for details).      Myriad of chronic conditions today, no vascular compromise, no signs of infection, normal vital signs, appropriate for discharge    Barth Kirks. Sedonia Small, MD [place] [email or web address]  Final Clinical Impressions(s) / ED Diagnoses     ICD-10-CM   1. Dry skin  L85.3   2. Arthritis  M19.90   3. Palate abnormality  Q38.5     ED Discharge Orders         Ordered    Cocoa Butter CREA     12/25/19 1255           Discharge Instructions Discussed with and Provided to Patient:     Discharge Instructions     You were evaluated in the Emergency Department and after careful evaluation, we did not find any emergent condition requiring admission or further testing in the hospital.  Your exam/testing today is overall reassuring.  Your blood sugar today was normal.  Use the cream provided as directed.  Drink more water at home.  Follow-up with the ear nose and throat specialist for the nodule in your mouth.  Please return to the Emergency Department if you experience any worsening of your condition.  We encourage you to follow up with a primary care provider.  Thank you for allowing [place] to be a part of your care.      Maudie Flakes, MD 12/25/19 1257

## 2019-12-25 NOTE — ED Triage Notes (Signed)
Pt arrives to ED with a c/o left hand dryness and swelling in knuckles- pt states this is chronic ongoing for "years" pt has multiple complaints- also reports pain in top of mouth showed me a bottle of amoxicillin he is currently on for that, complains of back pain and headache.

## 2020-02-01 ENCOUNTER — Other Ambulatory Visit: Payer: Self-pay

## 2020-02-01 ENCOUNTER — Ambulatory Visit: Payer: Self-pay | Admitting: Adult Health

## 2020-02-01 DIAGNOSIS — E119 Type 2 diabetes mellitus without complications: Secondary | ICD-10-CM

## 2020-02-01 DIAGNOSIS — E042 Nontoxic multinodular goiter: Secondary | ICD-10-CM

## 2020-02-01 DIAGNOSIS — J4520 Mild intermittent asthma, uncomplicated: Secondary | ICD-10-CM

## 2020-02-01 DIAGNOSIS — K219 Gastro-esophageal reflux disease without esophagitis: Secondary | ICD-10-CM

## 2020-02-01 NOTE — Progress Notes (Signed)
Established Patient Office Visit  Subjective:  Patient ID: Samuel [name], male    DOB: [birth year]  Age: 64 y.o. MRN: [id number]   HPI Samuel [name] presents for followup of chronic problems. Samuel [name] is not a good historian. Not sure if language barrier is playing a role in this  Past Medical History:  Diagnosis Date  . Arthritis    Knees  . Diabetes mellitus without complication ([place])   . Goiter    Multinodular Goiter with partial thyroidectomey in [place]  1998.  Ultrasound confirming diagnosis in 01/17/2014    Past Surgical History:  Procedure Laterality Date  . APPENDECTOMY  2014  . THYROID SURGERY  1998   Enlarged and painful thyroid  . THYROID SURGERY  1990    Family History  Problem Relation Age of Onset  . Arthritis Sister   . Arthritis Brother   . Prostate cancer Brother     Social History   Socioeconomic History  . Marital status: Married    Spouse name: Samuel [name]  . Number of children: 5  . Years of education: Not on file  . Highest education level: Not on file  Occupational History  . Occupation: unemployed currently  Tobacco Use  . Smoking status: Never Smoker  . Smokeless tobacco: Never Used  Vaping Use  . Vaping Use: Never used  Substance and Sexual Activity  . Alcohol use: No    Alcohol/week: 0.0 standard drinks  . Drug use: No  . Sexual activity: Not on file  Other Topics Concern  . Not on file  Social History Narrative   Originally from [place]   Came to [employer]. 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   First language is Arabic.   Social Determinants of Health   Financial Resource Strain:   . Difficulty of Paying Living Expenses:   Food Insecurity:   . Worried About Charity fundraiser in the Last Year:   . Arboriculturist in the Last Year:   Transportation Needs:   . Film/video editor (Medical):   Marland Kitchen Lack of Transportation (Non-Medical):   Physical  Activity:   . Days of Exercise per Week:   . Minutes of Exercise per Session:   Stress:   . Feeling of Stress :   Social Connections:   . Frequency of Communication with Friends and Family:   . Frequency of Social Gatherings with Friends and Family:   . Attends Religious Services:   . Active Member of Clubs or Organizations:   . Attends Archivist Meetings:   Marland Kitchen Marital Status:   Intimate Partner Violence:   . Fear of Current or Ex-Partner:   . Emotionally Abused:   Marland Kitchen Physically Abused:   . Sexually Abused:     Outpatient Medications Prior to Visit  Medication Sig Dispense Refill  . albuterol (VENTOLIN HFA) 108 (90 Base) MCG/ACT inhaler Inhale 2 puffs into the lungs every 6 (six) hours as needed for wheezing or shortness of breath. 1 Inhaler 0  . blood glucose meter kit and supplies KIT Check sugars twice daily, in morning when fasting and in evening before dinner 1 each 0  . Cocoa Butter CREA Apply to hands 3 times daily. 454 g 0  . metFORMIN (GLUCOPHAGE-XR) 500 MG 24 hr tablet 1 tab by mouth daily with dinner 30 tablet 2  . pantoprazole (PROTONIX)  20 MG tablet Take 1 tablet (20 mg total) by mouth daily. 30 tablet 0   No facility-administered medications prior to visit.    No Known Allergies  ROS Review of Systems    Objective:    Physical Exam  There were no vitals taken for this visit. Wt Readings from Last 3 Encounters:  12/25/19 128 lb (58.1 kg)  11/13/19 230 lb (104.3 kg)  09/09/19 228 lb 9.9 oz (103.7 kg)     Health Maintenance Due  Topic Date Due  . Hepatitis C Screening  Never done  . FOOT EXAM  Never done  . COVID-19 Vaccine (1) Never done  . COLONOSCOPY  Never done  . URINE MICROALBUMIN  12/06/2016  . OPHTHALMOLOGY EXAM  02/10/2017  . HEMOGLOBIN A1C  05/15/2019    There are no preventive care reminders to display for this patient.  Lab Results  Component Value Date   TSH 0.576 09/09/2019   Lab Results  Component Value Date   WBC  5.3 11/13/2019   HGB 13.8 11/13/2019   HCT 44.3 11/13/2019   MCV 85.5 11/13/2019   PLT 294 11/13/2019   Lab Results  Component Value Date   NA 138 09/09/2019   K 3.6 09/09/2019   CO2 24 09/09/2019   GLUCOSE 174 (H) 09/09/2019   BUN 12 09/09/2019   CREATININE 0.71 09/09/2019   BILITOT 0.7 09/09/2019   ALKPHOS 69 09/09/2019   AST 21 09/09/2019   ALT 31 09/09/2019   PROT 6.7 09/09/2019   ALBUMIN 3.5 09/09/2019   CALCIUM 9.2 09/09/2019   ANIONGAP 10 09/09/2019   Lab Results  Component Value Date   CHOL 167 10/07/2015   Lab Results  Component Value Date   HDL 48 10/07/2015   Lab Results  Component Value Date   LDLCALC 100 (H) 10/07/2015   Lab Results  Component Value Date   TRIG 123 11/15/2018   Lab Results  Component Value Date   CHOLHDL 3.4 03/12/2014   Lab Results  Component Value Date   HGBA1C 7.1 (H) 11/13/2018      Assessment & Plan:   1. Type 2 diabetes mellitus without complication, without long-term current use of insulin ([name]) He has stopped taking metformin. He states he does not think he needs it. Will check HgbA1c and proceed from there. Also check comprehensive metabolic panel  2. Gastroesophageal reflux disease, unspecified whether esophagitis present Denies any symptoms at present. No longer taking protonix  3. Mild intermittent asthma without complication Denies asthma. He was prescribed albuterol when he was c/o sob but I believe his symptoms are more related to his goiter  4. Multinodular goiter He c/o "my thyroid is getting bigger". Unable to assess patient has this was a telephone visit. Did not have video capability. History of partial thyroidectomy in [place]. Has had u/s in 09/09/19 with results as follows:  IMPRESSION: The 1.3 cm left inferior thyroid nodule no longer meets criteria for follow-up ultrasound. This nodule is clearly not taller than wide and now does not meet criteria for biopsy or dedicated follow-up. Two other  previously identified partially solid and partially cystic nodules in the right inferior and left mid thyroid are stable and do not meet criteria for biopsy or dedicated follow-up.  He had a CT soft tissue neck with contrast on 10/04/19:  IMPRESSION: Goiter with diffuse enlargement of the thyroid left greater than right. Multiple nodules in the thyroid bilaterally. Progressive enlargement and nodularity left inferior lobe of thyroid. Thyroid nodules  would be better evaluated by ultrasound. Cyst anterior to the thyroid has been resected since the prior CT of 2014.  Negative for tracheal stenosis.  In Epic, I can see where he was seen by ENT at [facility] on 10/30/19 noting history of multinoduar goiter with gradual enlargement over time. As mentioned above, he has had partial thyroidectomy in 2014. He was on levothyroxine per this note but, again, patient has decided he does not need and has not taken in months. Recommendations at the time was for total thyroidectomy but patient declined. I will order labs and have him RTC in 2 weeks to review and for further management. TSH T3 T4  5. Back pain / bilateral knee pain He reports this as "comes and goes". Denies injury or strain. Has tried multiple OTC medications - Aleve, motrin, ibuprofen but stopped all because "nothing helped". He cannot tell me what makes better - rest, walking, sitting. I will check inflammatory markers and have him RTC in 2 weeks. May need referral to ortho CRP Sed rate  Problem List Items Addressed This Visit    None      No orders of the defined types were placed in this encounter.   Follow-up: No follow-ups on file.    Chace Klippel Dagoberto Ligas, NP

## 2020-02-15 ENCOUNTER — Ambulatory Visit: Payer: Self-pay | Admitting: Internal Medicine

## 2020-02-18 ENCOUNTER — Ambulatory Visit (HOSPITAL_COMMUNITY)
Admission: EM | Admit: 2020-02-18 | Discharge: 2020-02-18 | Disposition: A | Discharge status: Home / Self Care | Payer: Medicaid Other | Attending: Urgent Care | Admitting: Urgent Care

## 2020-02-18 ENCOUNTER — Other Ambulatory Visit: Payer: Self-pay

## 2020-02-18 ENCOUNTER — Encounter (HOSPITAL_COMMUNITY): Payer: Self-pay | Admitting: Emergency Medicine

## 2020-02-18 DIAGNOSIS — M2550 Pain in unspecified joint: Secondary | ICD-10-CM

## 2020-02-18 DIAGNOSIS — G8929 Other chronic pain: Secondary | ICD-10-CM

## 2020-02-18 DIAGNOSIS — M5136 Other intervertebral disc degeneration, lumbar region: Secondary | ICD-10-CM

## 2020-02-18 DIAGNOSIS — M549 Dorsalgia, unspecified: Secondary | ICD-10-CM

## 2020-02-18 DIAGNOSIS — M545 Low back pain, unspecified: Secondary | ICD-10-CM

## 2020-02-18 DIAGNOSIS — K5900 Constipation, unspecified: Secondary | ICD-10-CM

## 2020-02-18 DIAGNOSIS — M1711 Unilateral primary osteoarthritis, right knee: Secondary | ICD-10-CM

## 2020-02-18 DIAGNOSIS — M1712 Unilateral primary osteoarthritis, left knee: Secondary | ICD-10-CM

## 2020-02-18 LAB — CBG MONITORING, ED: Glucose-Capillary: 155 mg/dL — ABNORMAL HIGH (ref 70–99)

## 2020-02-18 MED ORDER — PREDNISONE 20 MG PO TABS: ORAL_TABLET | ORAL | 0 refills | Status: DC

## 2020-02-18 NOTE — ED Triage Notes (Signed)
PT reports joint pain for 1 year. Worst in legs. Also reports back pain.  He took caffeine pill to treat similar symptoms a year ago.   Signed in for constipation as well, but states he had a BM last night and it was easy to pass.

## 2020-02-18 NOTE — ED Provider Notes (Signed)
[facility]   MRN: [id number] DOB: Oct 27, 1955  Subjective:   Samuel [name] is a 64 y.o. male presenting for 1 to 2-year history of persistent chronic low back pain, bilateral knee pain.  Patient has had multiple x-rays done and has been shown to have arthritis of both knees, degenerative disc disease of his low back.  He had a primary care provider through [facility] but has not followed up with them and would like information on the new practice.  He has previously used a caffeine pill to help with the symptoms.  Denies any new falls, trauma, weakness, numbness or tingling.  No current facility-administered medications for this encounter.  Current Outpatient Medications:  .  albuterol (VENTOLIN HFA) 108 (90 Base) MCG/ACT inhaler, Inhale 2 puffs into the lungs every 6 (six) hours as needed for wheezing or shortness of breath., Disp: 1 Inhaler, Rfl: 0 .  blood glucose meter kit and supplies KIT, Check sugars twice daily, in morning when fasting and in evening before dinner, Disp: 1 each, Rfl: 0 .  Cocoa Butter CREA, Apply to hands 3 times daily., Disp: 454 g, Rfl: 0 .  metFORMIN (GLUCOPHAGE-XR) 500 MG 24 hr tablet, 1 tab by mouth daily with dinner, Disp: 30 tablet, Rfl: 2 .  pantoprazole (PROTONIX) 20 MG tablet, Take 1 tablet (20 mg total) by mouth daily., Disp: 30 tablet, Rfl: 0   No Known Allergies  Past Medical History:  Diagnosis Date  . Arthritis    Knees  . Diabetes mellitus without complication ([place])   . Goiter    Multinodular Goiter with partial thyroidectomey in [place]  1998.  Ultrasound confirming diagnosis in 01/17/2014     Past Surgical History:  Procedure Laterality Date  . APPENDECTOMY  2014  . THYROID SURGERY  1998   Enlarged and painful thyroid  . THYROID SURGERY  1990    Family History  Problem Relation Age of Onset  . Arthritis Sister   . Arthritis Brother   . Prostate cancer Brother     Social History   Tobacco Use  . Smoking status:  Never Smoker  . Smokeless tobacco: Never Used  Vaping Use  . Vaping Use: Never used  Substance Use Topics  . Alcohol use: No    Alcohol/week: 0.0 standard drinks  . Drug use: No    ROS   Objective:   Vitals: BP 125/82   Pulse 72   Temp 97.9 F (36.6 C) (Oral)   Resp 16   SpO2 96%   Physical Exam Constitutional:      General: He is not in acute distress.    Appearance: Normal appearance. He is well-developed and normal weight. He is not ill-appearing, toxic-appearing or diaphoretic.  HENT:     Head: Normocephalic and atraumatic.     Right Ear: External ear normal.     Left Ear: External ear normal.     Nose: Nose normal.     Mouth/Throat:     Pharynx: Oropharynx is clear.  Eyes:     General: No scleral icterus.       Right eye: No discharge.        Left eye: No discharge.     Extraocular Movements: Extraocular movements intact.     Pupils: Pupils are equal, round, and reactive to light.  Cardiovascular:     Rate and Rhythm: Normal rate.  Pulmonary:     Effort: Pulmonary effort is normal.  Musculoskeletal:     Cervical back: Normal  range of motion.  Skin:    General: Skin is warm and dry.  Neurological:     Mental Status: He is alert and oriented to person, place, and time.     Motor: No weakness.     Coordination: Coordination normal.     Gait: Gait normal.     Deep Tendon Reflexes: Reflexes normal.  Psychiatric:        Mood and Affect: Mood normal.        Behavior: Behavior normal.        Thought Content: Thought content normal.        Judgment: Judgment normal.    Results for orders placed or performed during the hospital encounter of 02/18/20 (from the past 24 hour(s))  POC CBG monitoring     Status: Abnormal   Collection Time: 02/18/20 12:55 PM  Result Value Ref Range   Glucose-Capillary 155 (H) 70 - 99 mg/dL   Comment 1 Notify RN    Comment 2 Document in Chart     Assessment and Plan :   PDMP not reviewed this encounter.  1. Acute low back  pain without sciatica, unspecified back pain laterality   2. Degenerative disc disease, lumbar   3. Osteoarthritis of left knee, unspecified osteoarthritis type   4. Osteoarthritis of right knee, unspecified osteoarthritis type   5. Chronic pain of multiple joints    Imaging from 2019 and 2020 confirm osteoarthritis of both knees, degenerative disc disease of his low back.  Will defer repeat imaging given lack of any new falls or trauma.  Recommended oral steroid course with strict monitoring of his blood sugars.  Also needs to establish care with a new PCP through [facility] internal medicine.  Provided him with information for orthopedist practices to help him long-term with his arthritis. Counseled patient on potential for adverse effects with medications prescribed/recommended today, ER and return-to-clinic precautions discussed, patient verbalized understanding.    Jaynee Eagles, PA-C 02/18/20 1300

## 2020-02-29 ENCOUNTER — Ambulatory Visit: Payer: Self-pay

## 2020-02-29 ENCOUNTER — Ambulatory Visit: Payer: Self-pay | Admitting: Cardiovascular Disease

## 2020-03-22 ENCOUNTER — Encounter (HOSPITAL_COMMUNITY): Payer: Self-pay | Admitting: Emergency Medicine

## 2020-03-22 ENCOUNTER — Emergency Department (HOSPITAL_COMMUNITY)
Admission: EM | Admit: 2020-03-22 | Discharge: 2020-03-22 | Disposition: A | Discharge status: Home / Self Care | Payer: Medicaid Other | Attending: Emergency Medicine | Admitting: Emergency Medicine

## 2020-03-22 DIAGNOSIS — L988 Other specified disorders of the skin and subcutaneous tissue: Secondary | ICD-10-CM | POA: Insufficient documentation

## 2020-03-22 DIAGNOSIS — R739 Hyperglycemia, unspecified: Secondary | ICD-10-CM

## 2020-03-22 DIAGNOSIS — K117 Disturbances of salivary secretion: Secondary | ICD-10-CM | POA: Diagnosis not present

## 2020-03-22 DIAGNOSIS — L853 Xerosis cutis: Secondary | ICD-10-CM

## 2020-03-22 DIAGNOSIS — E119 Type 2 diabetes mellitus without complications: Secondary | ICD-10-CM | POA: Insufficient documentation

## 2020-03-22 DIAGNOSIS — Z7984 Long term (current) use of oral hypoglycemic drugs: Secondary | ICD-10-CM | POA: Diagnosis not present

## 2020-03-22 LAB — CBG MONITORING, ED: Glucose-Capillary: 412 mg/dL — ABNORMAL HIGH (ref 70–99)

## 2020-03-22 MED ORDER — BIOTENE DRY MOUTH DT PSTE: 0.5000 g | PASTE | Freq: Every morning | DENTAL | 1 refills | Status: DC

## 2020-03-22 MED ORDER — COCOA BUTTER CREA: 2.0000 g | TOPICAL_CREAM | Freq: Two times a day (BID) | 1 refills | Status: DC

## 2020-03-22 MED ORDER — METFORMIN HCL 500 MG PO TABS: 500.0000 mg | ORAL_TABLET | Freq: Two times a day (BID) | ORAL | 2 refills | Status: DC

## 2020-03-22 NOTE — Discharge Instructions (Addendum)
Please return to the emergency department with new or worsening symptoms.

## 2020-03-22 NOTE — ED Notes (Signed)
Pt asked for blood sugar to be checked during discharge.  CBG 412.  Notified [place], [place] and he talked with pt and additional Rx added for discharge.

## 2020-03-22 NOTE — ED Triage Notes (Signed)
Pt. Stated, I have dry mouth and my hands for 3 days.

## 2020-03-22 NOTE — ED Notes (Signed)
See PA assessment.  Pt seen in [place].

## 2020-03-22 NOTE — ED Provider Notes (Addendum)
[facility] EMERGENCY DEPARTMENT Provider Note   CSN: [id number] Arrival date & time: 03/22/20  3762     History Chief Complaint  Patient presents with  . Oral Swelling    dry mouth    Samuel [name] is a 64 y.o. male.  HPI Patient is a 64 year old male with a medical history as noted below.  He presents today due to chronic xerostomia as well as chronic dry skin.  Patient states that he has been experiencing xerostomia for some time and is been evaluated for this in the past.  Also notes dry skin on the bilateral hands that is worst along the knuckles.  Notes that sometimes they are so dry that they crack open and bleed.  He has been seen in the past for this and prescribed hand cream which he has not used.  He was followed at [facility] in the past but states that he stopped going there because "they were doing anything".  He has no other complaints at this time.  No fevers, chills, chest pain, shortness of breath, abdominal pain.    Past Medical History:  Diagnosis Date  . Arthritis    Knees  . Diabetes mellitus without complication ([place])   . Goiter    Multinodular Goiter with partial thyroidectomey in [place]  1998.  Ultrasound confirming diagnosis in 01/17/2014    Patient Active Problem List   Diagnosis Date Noted  . COVID-19 virus infection 11/13/2018  . Acute on chronic respiratory failure with hypoxia ([place]) 11/13/2018  . Type 2 diabetes mellitus ([place]) 11/13/2018  . Unspecified vitamin D deficiency 03/20/2014  . Dental cavities 03/20/2014  . IFG (impaired fasting glucose) 02/26/2014  . Multinodular goiter 02/26/2014  . Goiter     Past Surgical History:  Procedure Laterality Date  . APPENDECTOMY  2014  . THYROID SURGERY  1998   Enlarged and painful thyroid  . THYROID SURGERY  1990       Family History  Problem Relation Age of Onset  . Arthritis Sister   . Arthritis Brother   . Prostate cancer Brother     Social History    Tobacco Use  . Smoking status: Never Smoker  . Smokeless tobacco: Never Used  Vaping Use  . Vaping Use: Never used  Substance Use Topics  . Alcohol use: No    Alcohol/week: 0.0 standard drinks  . Drug use: No    Home Medications Prior to Admission medications   Medication Sig Start Date End Date Taking? Authorizing Provider  albuterol (VENTOLIN HFA) 108 (90 Base) MCG/ACT inhaler Inhale 2 puffs into the lungs every 6 (six) hours as needed for wheezing or shortness of breath. 11/16/18   Thurnell Lose, MD  blood glucose meter kit and supplies KIT Check sugars twice daily, in morning when fasting and in evening before dinner 02/08/16   Mack Hook, MD  Cocoa Butter CREA Apply to hands 3 times daily. 12/25/19   Maudie Flakes, MD  metFORMIN (GLUCOPHAGE-XR) 500 MG 24 hr tablet 1 tab by mouth daily with dinner 07/18/19   Providence Lanius A, PA-C  pantoprazole (PROTONIX) 20 MG tablet Take 1 tablet (20 mg total) by mouth daily. 11/13/19   Virgel Manifold, MD  predniSONE (DELTASONE) 20 MG tablet Take 2 tablets daily with breakfast. 02/18/20   Jaynee Eagles, PA-C    Allergies    Patient has no known allergies.  Review of Systems   Review of Systems  Constitutional: Negative for chills and  fever.  HENT:       +dry mouth  Respiratory: Negative for shortness of breath.   Cardiovascular: Negative for chest pain.  Gastrointestinal: Negative for abdominal pain, nausea and vomiting.  Skin:       +dry skin    Physical Exam Updated Vital Signs BP 106/78 (BP Location: Right Arm)   Pulse 72   Temp 98.1 F (36.7 C) (Oral)   Resp 14   Ht 6' (1.829 m)   Wt 99.8 kg   SpO2 97%   BMI 29.84 kg/m   Physical Exam Vitals and nursing note reviewed.  Constitutional:      General: He is not in acute distress.    Appearance: Normal appearance. He is well-developed and normal weight.  HENT:     Head: Normocephalic and atraumatic.     Right Ear: External ear normal.     Left Ear: External  ear normal.     Mouth/Throat:     Mouth: Mucous membranes are dry.     Pharynx: No oropharyngeal exudate or posterior oropharyngeal erythema.     Comments: Dry mouth noted.  Generally poor dentition with multiple missing teeth and dental caries noted.  No erythema noted in the posterior oropharynx.  Uvula is midline.  Readily handling secretions.  Speaking in clear, complete, coherent sentences. Eyes:     General: No scleral icterus.       Right eye: No discharge.        Left eye: No discharge.     Conjunctiva/sclera: Conjunctivae normal.  Neck:     Trachea: No tracheal deviation.  Cardiovascular:     Rate and Rhythm: Normal rate.  Pulmonary:     Effort: Pulmonary effort is normal. No respiratory distress.     Breath sounds: No stridor.  Abdominal:     General: There is no distension.  Musculoskeletal:        General: No swelling or deformity.     Cervical back: Neck supple.  Skin:    General: Skin is warm and dry.     Findings: No rash.     Comments: Mild dry skin noted along the dorsum of the bilateral hands with healing scabs noted along the extensor surfaces of the joints of the fingers.  Neurological:     Mental Status: He is alert.     Cranial Nerves: Cranial nerve deficit: no gross deficits.    ED Results / Procedures / Treatments   Labs (all labs ordered are listed, but only abnormal results are displayed) Labs Reviewed  CBG MONITORING, ED - Abnormal; Notable for the following components:      Result Value   Glucose-Capillary 412 (*)    All other components within normal limits    EKG None  Radiology No results found.  Procedures Procedures (including critical care time)  Medications Ordered in ED Medications - No data to display  ED Course  I have reviewed the triage vital signs and the nursing notes.  Pertinent labs & imaging results that were available during my care of the patient were reviewed by me and considered in my medical decision making (see  chart for details).    MDM Rules/Calculators/A&P                          Pt is a 64 y.o. male that presents with a history, physical exam, and ED Clinical Course as noted above.   Patient presents with dry mouth and  dry hands.  He has been seen for this in the past.  Will give a new prescription for hand cream as well as Biotene for dry mouth.  Discussed applying large amounts of hand cream at night and covering his hands with socks or gloves to help with absorption.    As patient was being discharged he requested that his CBG be checked by nursing staff.  It was noted to be elevated at 412.  Patient has been prescribed Metformin in the past but stopped taking it.  I discussed this with the patient.  He was given a new prescription for Metformin.  I strongly urged him to follow-up with his primary care provider to have his blood sugar rechecked and for further management regarding his diabetes mellitus.  Again, he denies any chest pain, shortness of breath, abdominal pain, nausea, vomiting, headache, dizziness.  Will discharge at this time.  Patient is hemodynamically stable and in NAD at the time of d/c. Evaluation does not show pathology that would require ongoing emergent intervention or inpatient treatment. I explained the diagnosis to the patient. Patient is comfortable with above plan and is stable for discharge at this time. All questions were answered prior to disposition. Strict return precautions for returning to the ED were discussed. Encouraged follow up with PCP.    An After Visit Summary was printed and given to the patient.  Patient discharged to home/self care.  Condition at discharge: Stable  Note: Portions of this report may have been transcribed using voice recognition software. Every effort was made to ensure accuracy; however, inadvertent computerized transcription errors may be present.   Final Clinical Impression(s) / ED Diagnoses Final diagnoses:  Xerostomia  Dry  skin  Hyperglycemia   Rx / DC Orders ED Discharge Orders         Ordered    Cocoa Butter CREA  2 times daily        03/22/20 1045    Dentifrices (BIOTENE DRY MOUTH) PSTE   Every morning - 10a        03/22/20 1045    metFORMIN (GLUCOPHAGE) 500 MG tablet  2 times daily with meals        03/22/20 1106           Rayna Sexton, PA-C 03/22/20 1045    Rayna Sexton, PA-C 03/22/20 1109    Quintella Reichert, MD 03/22/20 1125

## 2020-04-11 ENCOUNTER — Ambulatory Visit: Payer: Self-pay

## 2020-04-11 ENCOUNTER — Ambulatory Visit: Payer: Self-pay | Admitting: Internal Medicine

## 2020-08-10 ENCOUNTER — Emergency Department (HOSPITAL_COMMUNITY)
Admission: EM | Admit: 2020-08-10 | Discharge: 2020-08-11 | Disposition: A | Discharge status: Home / Self Care | Payer: Medicaid Other | Attending: Emergency Medicine | Admitting: Emergency Medicine

## 2020-08-10 ENCOUNTER — Encounter (HOSPITAL_COMMUNITY): Payer: Self-pay | Admitting: *Deleted

## 2020-08-10 ENCOUNTER — Other Ambulatory Visit: Payer: Self-pay

## 2020-08-10 DIAGNOSIS — Z8616 Personal history of COVID-19: Secondary | ICD-10-CM | POA: Diagnosis not present

## 2020-08-10 DIAGNOSIS — E1165 Type 2 diabetes mellitus with hyperglycemia: Secondary | ICD-10-CM | POA: Diagnosis not present

## 2020-08-10 DIAGNOSIS — Z7984 Long term (current) use of oral hypoglycemic drugs: Secondary | ICD-10-CM | POA: Diagnosis not present

## 2020-08-10 DIAGNOSIS — R631 Polydipsia: Secondary | ICD-10-CM | POA: Diagnosis present

## 2020-08-10 DIAGNOSIS — R739 Hyperglycemia, unspecified: Secondary | ICD-10-CM

## 2020-08-10 LAB — CBC
HCT: 42.1 % (ref 39.0–52.0)
Hemoglobin: 13.8 g/dL (ref 13.0–17.0)
MCH: 27.3 pg (ref 26.0–34.0)
MCHC: 32.8 g/dL (ref 30.0–36.0)
MCV: 83.4 fL (ref 80.0–100.0)
Platelets: 253 10*3/uL (ref 150–400)
RBC: 5.05 MIL/uL (ref 4.22–5.81)
RDW: 12.8 % (ref 11.5–15.5)
WBC: 4.7 10*3/uL (ref 4.0–10.5)
nRBC: 0 % (ref 0.0–0.2)

## 2020-08-10 LAB — BASIC METABOLIC PANEL
Anion gap: 9 (ref 5–15)
BUN: 11 mg/dL (ref 8–23)
CO2: 25 mmol/L (ref 22–32)
Calcium: 9.3 mg/dL (ref 8.9–10.3)
Chloride: 101 mmol/L (ref 98–111)
Creatinine, Ser: 0.83 mg/dL (ref 0.61–1.24)
GFR, Estimated: >60 mL/min (ref >60)
Glucose, Bld: 538 mg/dL (ref 70–99)
Potassium: 4.2 mmol/L (ref 3.5–5.1)
Sodium: 135 mmol/L (ref 135–145)

## 2020-08-10 LAB — URINALYSIS, ROUTINE W REFLEX MICROSCOPIC
Bacteria, UA: NONE SEEN
Bilirubin Urine: NEGATIVE
Glucose, UA: >=500 mg/dL — AB
Hgb urine dipstick: NEGATIVE
Ketones, ur: NEGATIVE mg/dL
Leukocytes,Ua: NEGATIVE
Nitrite: NEGATIVE
Protein, ur: NEGATIVE mg/dL
Specific Gravity, Urine: 1.028 (ref 1.005–1.030)
pH: 5 (ref 5.0–8.0)

## 2020-08-10 LAB — CBG MONITORING, ED
Glucose-Capillary: 380 mg/dL — ABNORMAL HIGH (ref 70–99)
Glucose-Capillary: 298 mg/dL — ABNORMAL HIGH (ref 70–99)

## 2020-08-10 MED ORDER — INSULIN ASPART 100 UNIT/ML ~~LOC~~ SOLN
2.0000 [IU] | Freq: Once | SUBCUTANEOUS | Status: AC
  Administered 2020-08-10: 2 [IU] via INTRAVENOUS

## 2020-08-10 MED ORDER — METFORMIN HCL 500 MG PO TABS: 500.0000 mg | ORAL_TABLET | Freq: Two times a day (BID) | ORAL | 0 refills | Status: DC

## 2020-08-10 MED ORDER — SODIUM CHLORIDE 0.9 % IV BOLUS
1000.0000 mL | Freq: Once | INTRAVENOUS | Status: AC
  Administered 2020-08-10: 1000 mL via INTRAVENOUS

## 2020-08-10 MED ORDER — ALBUTEROL SULFATE HFA 108 (90 BASE) MCG/ACT IN AERS: 2.0000 | INHALATION_SPRAY | Freq: Four times a day (QID) | RESPIRATORY_TRACT | 0 refills | Status: DC | PRN

## 2020-08-10 MED ORDER — PANTOPRAZOLE SODIUM 20 MG PO TBEC: 20.0000 mg | DELAYED_RELEASE_TABLET | Freq: Every day | ORAL | 0 refills | Status: DC

## 2020-08-10 NOTE — ED Provider Notes (Signed)
[facility] EMERGENCY DEPARTMENT Provider Note   CSN: [id number] Arrival date & time: 08/10/20  1433     History Chief Complaint  Patient presents with  . Polydipsia  . Urinary Frequency    Samuel [name] is a 65 y.o. male with PMH/o DM, Goiter who presents for evaluation of polydipsia, polyuria x1 week.  He reports that he is thirsty all the time and states that his mouth is always dry.  He reports that despite drinking a lot of fluids, it does not get any better.  Patient also reports that he is having polyuria.  He reports that he is going to the bathroom frequently.  No dysuria or hematuria associated.  Patient reports that he has not had any abdominal pain.  He states he has had a history of diabetes and states that about 4 months ago, he was given medication for his sugars.  He states he ran out of the medications and has not followed up with them since then.  He does not know when he last took the medication.  He denies any fevers, chills, chest pain, difficulty breathing, abdominal pain, nausea/vomiting.  The history is provided by the patient.       Past Medical History:  Diagnosis Date  . Arthritis    Knees  . Diabetes mellitus without complication ([place])   . Goiter    Multinodular Goiter with partial thyroidectomey in [place]  1998.  Ultrasound confirming diagnosis in 01/17/2014    Patient Active Problem List   Diagnosis Date Noted  . COVID-19 virus infection 11/13/2018  . Acute on chronic respiratory failure with hypoxia ([place]) 11/13/2018  . Type 2 diabetes mellitus ([place]) 11/13/2018  . Unspecified vitamin D deficiency 03/20/2014  . Dental cavities 03/20/2014  . IFG (impaired fasting glucose) 02/26/2014  . Multinodular goiter 02/26/2014  . Goiter     Past Surgical History:  Procedure Laterality Date  . APPENDECTOMY  2014  . THYROID SURGERY  1998   Enlarged and painful thyroid  . THYROID SURGERY  1990       Family History  Problem Relation  Age of Onset  . Arthritis Sister   . Arthritis Brother   . Prostate cancer Brother     Social History   Tobacco Use  . Smoking status: Never Smoker  . Smokeless tobacco: Never Used  Vaping Use  . Vaping Use: Never used  Substance Use Topics  . Alcohol use: No    Alcohol/week: 0.0 standard drinks  . Drug use: No    Home Medications Prior to Admission medications   Medication Sig Start Date End Date Taking? Authorizing Provider  albuterol (VENTOLIN HFA) 108 (90 Base) MCG/ACT inhaler Inhale 2 puffs into the lungs every 6 (six) hours as needed for wheezing or shortness of breath. 08/10/20   Volanda Napoleon, PA-C  blood glucose meter kit and supplies KIT Check sugars twice daily, in morning when fasting and in evening before dinner 02/08/16   Mack Hook, MD  Cocoa Butter CREA Apply to hands 3 times daily. 12/25/19   Maudie Flakes, MD  Cocoa Butter CREA 2 g by Does not apply route in the morning and at bedtime. 03/22/20   Rayna Sexton, PA-C  Dentifrices (BIOTENE DRY MOUTH) PSTE Place 0.5 g onto teeth every morning. 03/22/20   Rayna Sexton, PA-C  metFORMIN (GLUCOPHAGE) 500 MG tablet Take 1 tablet (500 mg total) by mouth 2 (two) times daily. 08/10/20 09/09/20  Volanda Napoleon, PA-C  pantoprazole (  PROTONIX) 20 MG tablet Take 1 tablet (20 mg total) by mouth daily. 08/10/20   Volanda Napoleon, PA-C  predniSONE (DELTASONE) 20 MG tablet Take 2 tablets daily with breakfast. Patient not taking: Reported on 08/10/2020 02/18/20   Jaynee Eagles, PA-C    Allergies    Patient has no known allergies.  Review of Systems   Review of Systems  Constitutional: Negative for fever.  Respiratory: Negative for cough and shortness of breath.   Cardiovascular: Negative for chest pain.  Gastrointestinal: Negative for abdominal pain, nausea and vomiting.  Endocrine: Positive for polydipsia and polyuria.  Genitourinary: Negative for dysuria and hematuria.  Neurological: Negative for headaches.   All other systems reviewed and are negative.   Physical Exam Updated Vital Signs BP 138/75 (BP Location: Right Arm)   Pulse 70   Temp 98.5 F (36.9 C) (Oral)   Resp 16   Ht 6' (1.829 m)   Wt 99.8 kg   SpO2 100%   BMI 29.84 kg/m   Physical Exam Vitals and nursing note reviewed.  Constitutional:      Appearance: Normal appearance. He is well-developed and well-nourished.  HENT:     Head: Normocephalic and atraumatic.     Mouth/Throat:     Mouth: Oropharynx is clear and moist and mucous membranes are normal.  Eyes:     General: Lids are normal.     Extraocular Movements: EOM normal.     Conjunctiva/sclera: Conjunctivae normal.     Pupils: Pupils are equal, round, and reactive to light.  Cardiovascular:     Rate and Rhythm: Normal rate and regular rhythm.     Pulses: Normal pulses.     Heart sounds: Normal heart sounds. No murmur heard. No friction rub. No gallop.   Pulmonary:     Effort: Pulmonary effort is normal.     Breath sounds: Normal breath sounds.  Abdominal:     Palpations: Abdomen is soft. Abdomen is not rigid.     Tenderness: There is no abdominal tenderness. There is no guarding.     Comments: Abdomen is soft, non-distended, non-tender. No rigidity, No guarding. No peritoneal signs.  Musculoskeletal:        General: Normal range of motion.     Cervical back: Full passive range of motion without pain.  Skin:    General: Skin is warm and dry.     Capillary Refill: Capillary refill takes less than 2 seconds.  Neurological:     Mental Status: He is alert and oriented to person, place, and time.  Psychiatric:        Mood and Affect: Mood and affect normal.        Speech: Speech normal.     ED Results / Procedures / Treatments   Labs (all labs ordered are listed, but only abnormal results are displayed) Labs Reviewed  BASIC METABOLIC PANEL - Abnormal; Notable for the following components:      Result Value   Glucose, Bld 538 (*)    All other  components within normal limits  URINALYSIS, ROUTINE W REFLEX MICROSCOPIC - Abnormal; Notable for the following components:   Color, Urine COLORLESS (*)    Glucose, UA >=500 (*)    All other components within normal limits  CBG MONITORING, ED - Abnormal; Notable for the following components:   Glucose-Capillary 380 (*)    All other components within normal limits  CBG MONITORING, ED - Abnormal; Notable for the following components:   Glucose-Capillary 298 (*)  All other components within normal limits  CBC  CBG MONITORING, ED    EKG None  Radiology No results found.  Procedures Procedures (including critical care time)  Medications Ordered in ED Medications  sodium chloride 0.9 % bolus 1,000 mL (1,000 mLs Intravenous New Bag/Given 08/10/20 2215)  insulin aspart (novoLOG) injection 2 Units (2 Units Intravenous Given 08/10/20 2216)    ED Course  I have reviewed the triage vital signs and the nursing notes.  Pertinent labs & imaging results that were available during my care of the patient were reviewed by me and considered in my medical decision making (see chart for details).    MDM Rules/Calculators/A&P                          65 year old male who presents for evaluation of polydipsia, polyuria x1 week.  History of diabetes and was given metformin but states he is not on them anymore.  He does not know when he last took them.  No associated dysuria, hematuria, abdominal pain.  On initial arrival, he is afebrile, nontoxic-appearing.  Vital signs are stable.  He has a benign abdominal exam.  Concern for hyperglycemia versus DKA versus HHS.  Labs, urine ordered in triage.  History/physical exam not concerning for UTI, intra-abdominal process.  BMP shows glucose of 538.  His bicarb is 25, his anion gap is 9.  UA shows glucosuria.  No evidence of ketones.  CBC shows no leukocytosis or anemia.  His repeat blood glucose is 380 here.  Will give fluids, insulin.  Work-up at this time  is not consistent with DKA.  Repeat CBG is 298.   At this time, patient's work-up is not consistent with DKA.  His blood sugars improved with fluids, insulin here in the emergency department.  We will plan to restart him on his metformin.  Patient instructed that he needs to follow-up with a primary care doctor. At this time, patient exhibits no emergent life-threatening condition that require further evaluation in ED. Patient had ample opportunity for questions and discussion. All patient's questions were answered with full understanding. Strict return precautions discussed. Patient expresses understanding and agreement to plan.   Portions of this note were generated with [employer]. Dictation errors may occur despite best attempts at proofreading.  Final Clinical Impression(s) / ED Diagnoses Final diagnoses:  Hyperglycemia    Rx / DC Orders ED Discharge Orders         Ordered    metFORMIN (GLUCOPHAGE) 500 MG tablet  2 times daily        08/10/20 2317    albuterol (VENTOLIN HFA) 108 (90 Base) MCG/ACT inhaler  Every 6 hours PRN        08/10/20 2317    pantoprazole (PROTONIX) 20 MG tablet  Daily        08/10/20 2317           Desma Mcgregor 08/10/20 2322    Quintella Reichert, MD 08/11/20 1331

## 2020-08-10 NOTE — ED Triage Notes (Signed)
Pt reports hx of diabetes. Having dry mouth, thirst and urinary frequency. Pt has not been taking his diabetes medications. No acute distress is noted at triage.

## 2020-08-10 NOTE — Discharge Instructions (Addendum)
Your blood sugar today was high.  You need to take your medication to help with your blood sugar.   You need to follow up with a primary care doctor. Either follow up with the [facility] or the [facility].   Return to the Emergency Dept for abdominal pain, nausea/vomiting, fever, any other worsening or concerning symptoms.

## 2020-08-11 NOTE — ED Notes (Signed)
Pt d/c by MD and is provided w/ d/c instructions and follow up care, Pt is out of the ED ambulatory by self  

## 2020-09-30 ENCOUNTER — Encounter (HOSPITAL_COMMUNITY): Payer: Self-pay | Admitting: Emergency Medicine

## 2020-09-30 ENCOUNTER — Emergency Department (HOSPITAL_COMMUNITY)
Admission: EM | Admit: 2020-09-30 | Discharge: 2020-09-30 | Disposition: A | Discharge status: Home / Self Care | Payer: Medicaid Other | Attending: Emergency Medicine | Admitting: Emergency Medicine

## 2020-09-30 DIAGNOSIS — E1165 Type 2 diabetes mellitus with hyperglycemia: Secondary | ICD-10-CM | POA: Insufficient documentation

## 2020-09-30 DIAGNOSIS — E119 Type 2 diabetes mellitus without complications: Secondary | ICD-10-CM | POA: Diagnosis not present

## 2020-09-30 DIAGNOSIS — E860 Dehydration: Secondary | ICD-10-CM | POA: Diagnosis not present

## 2020-09-30 DIAGNOSIS — Z7984 Long term (current) use of oral hypoglycemic drugs: Secondary | ICD-10-CM | POA: Diagnosis not present

## 2020-09-30 DIAGNOSIS — R739 Hyperglycemia, unspecified: Secondary | ICD-10-CM

## 2020-09-30 DIAGNOSIS — Z8616 Personal history of COVID-19: Secondary | ICD-10-CM | POA: Diagnosis not present

## 2020-09-30 LAB — URINALYSIS, ROUTINE W REFLEX MICROSCOPIC
Bacteria, UA: NONE SEEN
Bilirubin Urine: NEGATIVE
Glucose, UA: >=500 mg/dL — AB
Hgb urine dipstick: NEGATIVE
Ketones, ur: 80 mg/dL — AB
Leukocytes,Ua: NEGATIVE
Nitrite: NEGATIVE
Protein, ur: NEGATIVE mg/dL
Specific Gravity, Urine: 1.032 — ABNORMAL HIGH (ref 1.005–1.030)
pH: 5 (ref 5.0–8.0)

## 2020-09-30 LAB — CBC
HCT: 44.1 % (ref 39.0–52.0)
Hemoglobin: 13.8 g/dL (ref 13.0–17.0)
MCH: 27 pg (ref 26.0–34.0)
MCHC: 31.3 g/dL (ref 30.0–36.0)
MCV: 86.1 fL (ref 80.0–100.0)
Platelets: 207 10*3/uL (ref 150–400)
RBC: 5.12 MIL/uL (ref 4.22–5.81)
RDW: 12.5 % (ref 11.5–15.5)
WBC: 4.6 10*3/uL (ref 4.0–10.5)
nRBC: 0 % (ref 0.0–0.2)

## 2020-09-30 LAB — BASIC METABOLIC PANEL
Anion gap: 11 (ref 5–15)
BUN: 20 mg/dL (ref 8–23)
CO2: 21 mmol/L — ABNORMAL LOW (ref 22–32)
Calcium: 9.2 mg/dL (ref 8.9–10.3)
Chloride: 104 mmol/L (ref 98–111)
Creatinine, Ser: 1.07 mg/dL (ref 0.61–1.24)
GFR, Estimated: >60 mL/min (ref >60)
Glucose, Bld: 449 mg/dL — ABNORMAL HIGH (ref 70–99)
Potassium: 5.2 mmol/L — ABNORMAL HIGH (ref 3.5–5.1)
Sodium: 136 mmol/L (ref 135–145)

## 2020-09-30 LAB — CBG MONITORING, ED: Glucose-Capillary: 235 mg/dL — ABNORMAL HIGH (ref 70–99)

## 2020-09-30 LAB — TSH: TSH: 0.39 u[IU]/mL (ref 0.350–4.500)

## 2020-09-30 MED ORDER — METFORMIN HCL 500 MG PO TABS: 500.0000 mg | ORAL_TABLET | Freq: Two times a day (BID) | ORAL | 0 refills | Status: DC

## 2020-09-30 MED ORDER — SODIUM CHLORIDE 0.9 % IV BOLUS: 1000.0000 mL | Freq: Once | INTRAVENOUS | Status: DC

## 2020-09-30 MED ORDER — LACTATED RINGERS IV BOLUS
1000.0000 mL | Freq: Once | INTRAVENOUS | Status: AC
  Administered 2020-09-30: 1000 mL via INTRAVENOUS

## 2020-09-30 NOTE — ED Triage Notes (Signed)
Patient BIB [facility] from MD office for evaluation of dizziness, and hyperglycemia. CBG >600 at PCP office, given 450 mL NS with EMS, recheck CBG 467, EMS reports patient stated he does not have diabetes and believes the hyperglycemia is related to a thyroid issue. Patient alert, oriented, and in no apparent distress at this time.

## 2020-09-30 NOTE — Discharge Instructions (Signed)
Get help right away if: Your blood glucose monitor reads "high" even when you are taking insulin. You have trouble breathing. You have a change in how you think, feel, or act (mental status). You have nausea or vomiting that does not go away.

## 2020-09-30 NOTE — ED Provider Notes (Signed)
[facility] EMERGENCY DEPARTMENT Provider Note   CSN: [id number] Arrival date & time: 09/30/20  1023     History Chief Complaint  Patient presents with  . Dizziness    Samuel [name] is a 65 y.o. male.  Patient with history of hyperglycemia, elevated hemoglobin A1c not currently on antihyperglycemics, history of goiter not currently on medication for thyroid --presents the emergency department today for evaluation of high blood sugar.  Patient states that he was at his doctor (he cannot tell me the name of the doctor) and was found to have high blood sugar.  This prompted transport by EMS to the emergency department.  Patient also states that he felt dizzy described as lightheadedness and spinning sensation this morning.  He states that over the past week he has been very thirsty and has been urinating frequently with increased urgency and frequency.  He states that he has been losing weight but cannot tell me how much.  He states that his skin is very dry and that his mouth is dry.  EMS gave 450 cc normal saline with improvement of blood sugar from greater than 600 > 467.  Denies fevers, URI symptoms, chest pain, shortness of breath or cough.  He has not had diarrhea or constipation.  He denies dysuria.  No skin rashes or leg swelling.  Denies current medications and denies over-the-counter medications.  He states that his blood sugar becomes elevated when his thyroid is overactive.        Past Medical History:  Diagnosis Date  . Arthritis    Knees  . Diabetes mellitus without complication ([place])   . Goiter    Multinodular Goiter with partial thyroidectomey in [place]  1998.  Ultrasound confirming diagnosis in 01/17/2014    Patient Active Problem List   Diagnosis Date Noted  . COVID-19 virus infection 11/13/2018  . Acute on chronic respiratory failure with hypoxia ([place]) 11/13/2018  . Type 2 diabetes mellitus ([place]) 11/13/2018  . Unspecified vitamin D deficiency  03/20/2014  . Dental cavities 03/20/2014  . IFG (impaired fasting glucose) 02/26/2014  . Multinodular goiter 02/26/2014  . Goiter     Past Surgical History:  Procedure Laterality Date  . APPENDECTOMY  2014  . THYROID SURGERY  1998   Enlarged and painful thyroid  . THYROID SURGERY  1990       Family History  Problem Relation Age of Onset  . Arthritis Sister   . Arthritis Brother   . Prostate cancer Brother     Social History   Tobacco Use  . Smoking status: Never Smoker  . Smokeless tobacco: Never Used  Vaping Use  . Vaping Use: Never used  Substance Use Topics  . Alcohol use: No    Alcohol/week: 0.0 standard drinks  . Drug use: No    Home Medications Prior to Admission medications   Medication Sig Start Date End Date Taking? Authorizing Provider  albuterol (VENTOLIN HFA) 108 (90 Base) MCG/ACT inhaler Inhale 2 puffs into the lungs every 6 (six) hours as needed for wheezing or shortness of breath. 08/10/20   Volanda Napoleon, PA-C  blood glucose meter kit and supplies KIT Check sugars twice daily, in morning when fasting and in evening before dinner 02/08/16   Mack Hook, MD  Cocoa Butter CREA Apply to hands 3 times daily. 12/25/19   Maudie Flakes, MD  Cocoa Butter CREA 2 g by Does not apply route in the morning and at bedtime. 03/22/20   Vernia Buff,  [place], PA-C  Dentifrices (BIOTENE DRY MOUTH) PSTE Place 0.5 g onto teeth every morning. 03/22/20   Rayna Sexton, PA-C  metFORMIN (GLUCOPHAGE) 500 MG tablet Take 1 tablet (500 mg total) by mouth 2 (two) times daily. 08/10/20 09/09/20  Volanda Napoleon, PA-C  pantoprazole (PROTONIX) 20 MG tablet Take 1 tablet (20 mg total) by mouth daily. 08/10/20   Volanda Napoleon, PA-C  predniSONE (DELTASONE) 20 MG tablet Take 2 tablets daily with breakfast. Patient not taking: Reported on 08/10/2020 02/18/20   Jaynee Eagles, PA-C    Allergies    Patient has no known allergies.  Review of Systems   Review of Systems   Constitutional: Negative for fever.  HENT: Negative for rhinorrhea and sore throat.   Eyes: Negative for redness.  Respiratory: Negative for cough and shortness of breath.   Cardiovascular: Negative for chest pain.  Gastrointestinal: Negative for abdominal pain, diarrhea, nausea and vomiting.  Endocrine: Positive for polydipsia and polyuria.  Genitourinary: Positive for frequency. Negative for dysuria and hematuria.  Musculoskeletal: Negative for myalgias.  Skin: Negative for rash.  Neurological: Positive for dizziness and light-headedness. Negative for headaches.    Physical Exam Updated Vital Signs BP 114/80 (BP Location: Right Arm)   Pulse 77   Temp 98.4 F (36.9 C) (Oral)   Resp 16   SpO2 98%   Physical Exam Vitals and nursing note reviewed.  Constitutional:      Appearance: He is well-developed and well-nourished.  HENT:     Head: Normocephalic and atraumatic.     Mouth/Throat:     Mouth: Mucous membranes are dry.     Comments: Dry mouth with cracked lips.  Patient with 2 small nodules noted on the roof of the mouth.  Poor dentition with multiple cavities.  No abscess. Eyes:     General:        Right eye: No discharge.        Left eye: No discharge.     Conjunctiva/sclera: Conjunctivae normal.  Neck:     Comments: Bilateral goiter noted. Cardiovascular:     Rate and Rhythm: Normal rate and regular rhythm.     Heart sounds: Normal heart sounds.     Comments: No tachycardia or murmur. Pulmonary:     Effort: Pulmonary effort is normal.     Breath sounds: Normal breath sounds.  Abdominal:     Palpations: Abdomen is soft.     Tenderness: There is no abdominal tenderness.  Musculoskeletal:     Cervical back: Normal range of motion and neck supple.     Right lower leg: No edema.     Left lower leg: No edema.  Skin:    General: Skin is warm and dry.     Findings: No rash.     Comments: Skin is very dry and flaky on bilateral upper extremities.   Neurological:      General: No focal deficit present.     Mental Status: He is alert.  Psychiatric:        Mood and Affect: Mood and affect and mood normal.     ED Results / Procedures / Treatments   Labs (all labs ordered are listed, but only abnormal results are displayed) Labs Reviewed  BASIC METABOLIC PANEL - Abnormal; Notable for the following components:      Result Value   Potassium 5.2 (*)    CO2 21 (*)    Glucose, Bld 449 (*)    All other components within normal limits  URINALYSIS, ROUTINE W REFLEX MICROSCOPIC - Abnormal; Notable for the following components:   Color, Urine STRAW (*)    Specific Gravity, Urine 1.032 (*)    Glucose, UA >=500 (*)    Ketones, ur 80 (*)    All other components within normal limits  CBC  TSH  HEMOGLOBIN A1C  CBG MONITORING, ED    ED ECG REPORT   Date: 09/30/2020  Rate: 68  Rhythm: normal sinus rhythm  QRS Axis: normal  Intervals: normal  ST/T Wave abnormalities: nonspecific ST/T changes similar to 10/2019  Conduction Disutrbances:none  Narrative Interpretation:   Old EKG Reviewed: unchanged  I have personally reviewed the EKG tracing and agree with the computerized printout as noted.  Radiology No results found.  Procedures Procedures   Medications Ordered in ED Medications  lactated ringers bolus 1,000 mL (1,000 mLs Intravenous New Bag/Given 09/30/20 1331)  lactated ringers bolus 1,000 mL (1,000 mLs Intravenous New Bag/Given 09/30/20 1350)    ED Course  I have reviewed the triage vital signs and the nursing notes.  Pertinent labs & imaging results that were available during my care of the patient were reviewed by me and considered in my medical decision making (see chart for details).  Patient seen and examined. Work-up reviewed.  Blood sugar is elevated in the 400s.  Patient appears dry on exam.  Will give fluid boluses and recheck blood sugar.  Will check TSH and hemoglobin A1c.  Will check orthostatics and repeat EKG.  Patient does not  currently complain of chest pain or shortness of breath.  Vital signs reviewed and are as follows: BP 114/80 (BP Location: Right Arm)   Pulse 77   Temp 98.4 F (36.9 C) (Oral)   Resp 16   SpO2 98%    3:16 PM EKG reviewed. Awaiting hydration, CBG recheck, reassessment.   Signout to [employer] at shift change. Likely will need to restart metformin.     MDM Rules/Calculators/A&P                          Pending completion of work-up.   Final Clinical Impression(s) / ED Diagnoses Final diagnoses:  Hyperglycemia  Dehydration    Rx / DC Orders ED Discharge Orders    None       Carlisle Cater, PA-C 09/30/20 [place], MD 10/01/20 1330

## 2020-10-01 LAB — HEMOGLOBIN A1C
Hgb A1c MFr Bld: >15.5 % — ABNORMAL HIGH (ref 4.8–5.6)
Mean Plasma Glucose: >398 mg/dL

## 2020-11-13 ENCOUNTER — Other Ambulatory Visit: Payer: Self-pay | Admitting: Otolaryngology

## 2020-11-13 DIAGNOSIS — E049 Nontoxic goiter, unspecified: Secondary | ICD-10-CM

## 2021-02-23 ENCOUNTER — Emergency Department (HOSPITAL_COMMUNITY)
Admission: EM | Admit: 2021-02-23 | Discharge: 2021-02-23 | Disposition: A | Discharge status: Home / Self Care | Payer: Medicaid Other | Attending: Emergency Medicine | Admitting: Emergency Medicine

## 2021-02-23 ENCOUNTER — Encounter (HOSPITAL_COMMUNITY): Payer: Self-pay | Admitting: Emergency Medicine

## 2021-02-23 DIAGNOSIS — Z7984 Long term (current) use of oral hypoglycemic drugs: Secondary | ICD-10-CM | POA: Diagnosis not present

## 2021-02-23 DIAGNOSIS — Z8616 Personal history of COVID-19: Secondary | ICD-10-CM | POA: Insufficient documentation

## 2021-02-23 DIAGNOSIS — R350 Frequency of micturition: Secondary | ICD-10-CM | POA: Diagnosis present

## 2021-02-23 DIAGNOSIS — E1165 Type 2 diabetes mellitus with hyperglycemia: Secondary | ICD-10-CM | POA: Diagnosis not present

## 2021-02-23 DIAGNOSIS — R739 Hyperglycemia, unspecified: Secondary | ICD-10-CM

## 2021-02-23 LAB — CBC WITH DIFFERENTIAL/PLATELET
Abs Immature Granulocytes: 0.01 10*3/uL (ref 0.00–0.07)
Basophils Absolute: 0 10*3/uL (ref 0.0–0.1)
Basophils Relative: 1 %
Eosinophils Absolute: 0.1 10*3/uL (ref 0.0–0.5)
Eosinophils Relative: 2 %
HCT: 42.9 % (ref 39.0–52.0)
Hemoglobin: 13.8 g/dL (ref 13.0–17.0)
Immature Granulocytes: 0 %
Lymphocytes Relative: 39 %
Lymphs Abs: 1.6 10*3/uL (ref 0.7–4.0)
MCH: 27.3 pg (ref 26.0–34.0)
MCHC: 32.2 g/dL (ref 30.0–36.0)
MCV: 84.8 fL (ref 80.0–100.0)
Monocytes Absolute: 0.5 10*3/uL (ref 0.1–1.0)
Monocytes Relative: 12 %
Neutro Abs: 1.9 10*3/uL (ref 1.7–7.7)
Neutrophils Relative %: 46 %
Platelets: 239 10*3/uL (ref 150–400)
RBC: 5.06 MIL/uL (ref 4.22–5.81)
RDW: 12.7 % (ref 11.5–15.5)
WBC: 4.1 10*3/uL (ref 4.0–10.5)
nRBC: 0 % (ref 0.0–0.2)

## 2021-02-23 LAB — TSH: TSH: 0.68 u[IU]/mL (ref 0.350–4.500)

## 2021-02-23 LAB — CBG MONITORING, ED
Glucose-Capillary: >600 mg/dL (ref 70–99)
Glucose-Capillary: 490 mg/dL — ABNORMAL HIGH (ref 70–99)
Glucose-Capillary: 307 mg/dL — ABNORMAL HIGH (ref 70–99)
Glucose-Capillary: 443 mg/dL — ABNORMAL HIGH (ref 70–99)
Glucose-Capillary: 251 mg/dL — ABNORMAL HIGH (ref 70–99)

## 2021-02-23 LAB — I-STAT VENOUS BLOOD GAS, ED
Acid-Base Excess: 2 mmol/L (ref 0.0–2.0)
Bicarbonate: 27 mmol/L (ref 20.0–28.0)
Calcium, Ion: 1.2 mmol/L (ref 1.15–1.40)
HCT: 43 % (ref 39.0–52.0)
Hemoglobin: 14.6 g/dL (ref 13.0–17.0)
O2 Saturation: 88 %
Potassium: 4.1 mmol/L (ref 3.5–5.1)
Sodium: 134 mmol/L — ABNORMAL LOW (ref 135–145)
TCO2: 28 mmol/L (ref 22–32)
pCO2, Ven: 42.8 mmHg — ABNORMAL LOW (ref 44.0–60.0)
pH, Ven: 7.407 (ref 7.250–7.430)
pO2, Ven: 54 mmHg — ABNORMAL HIGH (ref 32.0–45.0)

## 2021-02-23 LAB — URINALYSIS, ROUTINE W REFLEX MICROSCOPIC
Bacteria, UA: NONE SEEN
Bilirubin Urine: NEGATIVE
Glucose, UA: >=500 mg/dL — AB
Hgb urine dipstick: NEGATIVE
Ketones, ur: NEGATIVE mg/dL
Leukocytes,Ua: NEGATIVE
Nitrite: NEGATIVE
Protein, ur: NEGATIVE mg/dL
Specific Gravity, Urine: 1.028 (ref 1.005–1.030)
pH: 6 (ref 5.0–8.0)

## 2021-02-23 LAB — BASIC METABOLIC PANEL
Anion gap: 11 (ref 5–15)
BUN: 11 mg/dL (ref 8–23)
CO2: 25 mmol/L (ref 22–32)
Calcium: 9.3 mg/dL (ref 8.9–10.3)
Chloride: 93 mmol/L — ABNORMAL LOW (ref 98–111)
Creatinine, Ser: 0.96 mg/dL (ref 0.61–1.24)
GFR, Estimated: >60 mL/min (ref >60)
Glucose, Bld: 738 mg/dL (ref 70–99)
Potassium: 4.9 mmol/L (ref 3.5–5.1)
Sodium: 129 mmol/L — ABNORMAL LOW (ref 135–145)

## 2021-02-23 LAB — BETA-HYDROXYBUTYRIC ACID: Beta-Hydroxybutyric Acid: 0.15 mmol/L (ref 0.05–0.27)

## 2021-02-23 LAB — T4, FREE: Free T4: 0.95 ng/dL (ref 0.61–1.12)

## 2021-02-23 MED ORDER — DEXTROSE 50 % IV SOLN: 0.0000 mL | INTRAVENOUS | Status: DC | PRN

## 2021-02-23 MED ORDER — METFORMIN HCL 1000 MG PO TABS: 1000.0000 mg | ORAL_TABLET | Freq: Two times a day (BID) | ORAL | 3 refills | Status: DC

## 2021-02-23 MED ORDER — SITAGLIPTIN PHOSPHATE 50 MG PO TABS: 50.0000 mg | ORAL_TABLET | Freq: Every day | ORAL | 2 refills | Status: DC

## 2021-02-23 MED ORDER — INSULIN REGULAR(HUMAN) IN NACL 100-0.9 UT/100ML-% IV SOLN
INTRAVENOUS | Status: DC
  Administered 2021-02-23: 8 [IU]/h via INTRAVENOUS
  Filled 2021-02-23: qty 100

## 2021-02-23 MED ORDER — SODIUM CHLORIDE 0.9 % IV BOLUS
1000.0000 mL | Freq: Once | INTRAVENOUS | Status: AC
  Administered 2021-02-23: 1000 mL via INTRAVENOUS

## 2021-02-23 NOTE — ED Provider Notes (Signed)
[facility] EMERGENCY DEPARTMENT Provider Note   CSN: [id number] Arrival date & time: 02/23/21  1008     History CC: Dry mouth, thirst   Samuel [name] is a 65 y.o. male with hx of goiter with partial thyroidectomy in 1998, diabetes , presenting to Ed with dry mouth and urinary frequency for several weeks.  He reports he was formerly on a diabetes pill but has not taken it.  He states he had similar symptoms in the past when his "thyroid levels were abnormal."  Denies palpitations, lightheadedness.  Drinks lots of water.  HPI     Past Medical History:  Diagnosis Date   Arthritis    Knees   Diabetes mellitus without complication ([place])    Goiter    Multinodular Goiter with partial thyroidectomey in [place]  1998.  Ultrasound confirming diagnosis in 01/17/2014    Patient Active Problem List   Diagnosis Date Noted   COVID-19 virus infection 11/13/2018   Acute on chronic respiratory failure with hypoxia ([place]) 11/13/2018   Type 2 diabetes mellitus ([place]) 11/13/2018   Unspecified vitamin D deficiency 03/20/2014   Dental cavities 03/20/2014   IFG (impaired fasting glucose) 02/26/2014   Multinodular goiter 02/26/2014   Goiter     Past Surgical History:  Procedure Laterality Date   APPENDECTOMY  2014   THYROID SURGERY  1998   Enlarged and painful thyroid   THYROID SURGERY  1990       Family History  Problem Relation Age of Onset   Arthritis Sister    Arthritis Brother    Prostate cancer Brother     Social History   Tobacco Use   Smoking status: Never   Smokeless tobacco: Never  Vaping Use   Vaping Use: Never used  Substance Use Topics   Alcohol use: No    Alcohol/week: 0.0 standard drinks   Drug use: No    Home Medications Prior to Admission medications   Medication Sig Start Date End Date Taking? Authorizing Provider  metFORMIN (GLUCOPHAGE) 1000 MG tablet Take 1 tablet (1,000 mg total) by mouth 2 (two) times daily. 02/23/21 03/25/21 Yes Demaree Liberto,  Carola Rhine, MD  sitaGLIPtin (JANUVIA) 50 MG tablet Take 1 tablet (50 mg total) by mouth daily for 30 doses. 02/23/21 03/25/21 Yes Wyvonnia Dusky, MD  blood glucose meter kit and supplies KIT Check sugars twice daily, in morning when fasting and in evening before dinner Patient not taking: No sig reported 02/08/16   Mack Hook, MD  JANUVIA 100 MG tablet Take 50 mg by mouth daily. 02/11/21   [provider]  JARDIANCE 25 MG TABS tablet Take 25 mg by mouth daily. 02/11/21   [provider]  metFORMIN (GLUCOPHAGE) 1000 MG tablet Take 1,000 mg by mouth 2 (two) times daily. 02/11/21   [provider]  metFORMIN (GLUCOPHAGE) 500 MG tablet Take 1 tablet (500 mg total) by mouth 2 (two) times daily. 09/30/20 02/23/21  Carlisle Cater, PA-C  albuterol (VENTOLIN HFA) 108 (90 Base) MCG/ACT inhaler Inhale 2 puffs into the lungs every 6 (six) hours as needed for wheezing or shortness of breath. Patient not taking: Reported on 09/30/2020 08/10/20 09/30/20  [place] Lanius A, PA-C  pantoprazole (PROTONIX) 20 MG tablet Take 1 tablet (20 mg total) by mouth daily. Patient not taking: Reported on 09/30/2020 08/10/20 09/30/20  Volanda Napoleon, PA-C    Allergies    Patient has no known allergies.  Review of Systems   Review of Systems  Constitutional:  Negative for chills and fever.  Eyes:  Negative for pain and visual disturbance.  Respiratory:  Negative for cough and shortness of breath.   Cardiovascular:  Positive for palpitations. Negative for chest pain.  Gastrointestinal:  Negative for abdominal pain and vomiting.  Endocrine: Positive for polydipsia and polyuria.  Genitourinary:  Negative for dysuria and hematuria.  Musculoskeletal:  Negative for arthralgias and back pain.  Skin:  Negative for color change and rash.  Neurological:  Negative for syncope and headaches.  All other systems reviewed and are negative.  Physical Exam Updated Vital Signs BP 109/78   Pulse 63   Temp 98 F  (36.7 C) (Oral)   Resp 11   SpO2 100%   Physical Exam Constitutional:      General: He is not in acute distress. HENT:     Head: Normocephalic and atraumatic.  Eyes:     Conjunctiva/sclera: Conjunctivae normal.     Pupils: Pupils are equal, round, and reactive to light.  Cardiovascular:     Rate and Rhythm: Normal rate and regular rhythm.     Pulses: Normal pulses.  Pulmonary:     Effort: Pulmonary effort is normal. No respiratory distress.  Abdominal:     General: There is no distension.     Tenderness: There is no abdominal tenderness.  Skin:    General: Skin is warm and dry.  Neurological:     General: No focal deficit present.     Mental Status: He is alert. Mental status is at baseline.  Psychiatric:        Mood and Affect: Mood normal.        Behavior: Behavior normal.    ED Results / Procedures / Treatments   Labs (all labs ordered are listed, but only abnormal results are displayed) Labs Reviewed  BASIC METABOLIC PANEL - Abnormal; Notable for the following components:      Result Value   Sodium 129 (*)    Chloride 93 (*)    Glucose, Bld 738 (*)    All other components within normal limits  URINALYSIS, ROUTINE W REFLEX MICROSCOPIC - Abnormal; Notable for the following components:   Color, Urine STRAW (*)    Glucose, UA >=500 (*)    All other components within normal limits  CBG MONITORING, ED - Abnormal; Notable for the following components:   Glucose-Capillary >600 (*)    All other components within normal limits  I-STAT VENOUS BLOOD GAS, ED - Abnormal; Notable for the following components:   pCO2, Ven 42.8 (*)    pO2, Ven 54.0 (*)    Sodium 134 (*)    All other components within normal limits  CBG MONITORING, ED - Abnormal; Notable for the following components:   Glucose-Capillary 490 (*)    All other components within normal limits  CBG MONITORING, ED - Abnormal; Notable for the following components:   Glucose-Capillary 443 (*)    All other  components within normal limits  CBG MONITORING, ED - Abnormal; Notable for the following components:   Glucose-Capillary 307 (*)    All other components within normal limits  CBG MONITORING, ED - Abnormal; Notable for the following components:   Glucose-Capillary 251 (*)    All other components within normal limits  CBC WITH DIFFERENTIAL/PLATELET  BETA-HYDROXYBUTYRIC ACID  TSH  T4, FREE  HEMOGLOBIN A1C    EKG None  Radiology No results found.  Procedures Procedures   Medications Ordered in ED Medications  insulin regular, human (MYXREDLIN) 100 units/  100 mL infusion ( Intravenous Stopped 02/23/21 1435)  dextrose 50 % solution 0-50 mL (has no administration in time range)  sodium chloride 0.9 % bolus 1,000 mL (0 mLs Intravenous Stopped 02/23/21 1222)  sodium chloride 0.9 % bolus 1,000 mL (0 mLs Intravenous Stopped 02/23/21 1338)    ED Course  I have reviewed the triage vital signs and the nursing notes.  Pertinent labs & imaging results that were available during my care of the patient were reviewed by me and considered in my medical decision making (see chart for details).  This patient complains of polyuria, polydipsia.  This involves an extensive number of treatment options, and is a complaint that carries with it a high risk of complications and morbidity.  The differential diagnosis includes symptomatic hypoglycemia, likely related to medical noncompliance and untreated diabetes.  The patient is insistent that this is related to his thyroid.  Therefore I did check his thyroid levels, which were unremarkable and normal.  Had a thyroidectomy many years ago.  He has no other evidence of hyperthyroidism or hypothyroidism per his clinical exam.  I ordered, reviewed, and interpreted labs.  He has a sodium of 129 which may be pseudohyponatremia in the setting of hyperglycemia.  Initial glucose was 738.  Anion gap is normal.  No ketones in the urine, no acidosis on VBG.  Beta hydroxy  level was within normal limits.  I doubt this is DKA.  I suspect this is simply symptomatic hyperglycemia.  The patient was started on insulin until we are able to correct his BS to within a reasonable range.  He was also given 2 L of fluid.  He felt better at the end of this work-up.    Clinical Course as of 02/23/21 1437  Tue Feb 23, 2021  1139 Glucose(!!): 738 [MT]  1139 Anion gap: 11 [MT]  1139 I ordered 2 L of fluid total.  We will start him on insulin to see if we can lower his blood sugars.  There is no anion gap at this time. [MT]  1155 pH, Ven: 7.407 [MT]  1326 T4,Free(Direct): 0.95 [MT]  1326 TSH: 0.680 [MT]  1350 Discussed the patient's medical work-up with him.  Given the elevation of his glucose, I think he would benefit from being on both metformin and glipizide.  His A1c should result by the end of the day, but he has a PCP you can follow-up with [place].  I stressed the importance of PCP follow-up.  We also talked about cutting back on the soda intake, sugary and sweet food. [MT]  1435 Blood sugars dramatically improved.  250 now.  I will represcribe the medications the patient formerly been on as of last year, thus milligrams metformin twice daily and 50 mg of Januvia.  I emphasized to him the importance of taking these medications.  Also the importance of needing PCP follow-up.  He verbalized understanding. [MT]    Clinical Course User Index [MT] Altamese Deguire, Carola Rhine, MD    Final Clinical Impression(s) / ED Diagnoses Final diagnoses:  Hyperglycemia    Rx / DC Orders ED Discharge Orders          Ordered    metFORMIN (GLUCOPHAGE) 1000 MG tablet  2 times daily        02/23/21 1353    sitaGLIPtin (JANUVIA) 50 MG tablet  Daily        02/23/21 1353             Maevis Mumby, Carola Rhine,  MD 02/23/21 1437

## 2021-02-23 NOTE — ED Triage Notes (Signed)
Pt here from home with c/o dry mouth that has been ongoing for a while

## 2021-02-23 NOTE — ED Provider Notes (Signed)
Emergency Medicine Provider Triage Evaluation Note  Emoni Kayton , a 65 y.o. male  was evaluated in triage.  Pt complains of dry mouth.  Symptoms have been going on for several weeks.  History of diabetes but he is unsure if this is related to his thyroid.  Does not take thyroid medication.  Reports frequent urination only when he drinks a lot fluids.  Review of Systems  Positive: Dry mouth Negative: Vomiting  Physical Exam  BP 136/88   Pulse 66   Temp 98 F (36.7 C) (Oral)   Resp 16   SpO2 99%  Gen:   Awake, no distress   Resp:  Normal effort  MSK:   Moves extremities without difficulty  Other:    Medical Decision Making  Medically screening exam initiated at 10:19 AM.  Appropriate orders placed.  Diogo Rydalch was informed that the remainder of the evaluation will be completed by another provider, this initial triage assessment does not replace that evaluation, and the importance of remaining in the ED until their evaluation is complete.  Lab work and CBG ordered   Delia Heady, PA-C 02/23/21 1020    Wyvonnia Dusky, MD 02/23/21 1745

## 2021-02-23 NOTE — Discharge Instructions (Addendum)
Please call your primary care provider's office today or tomorrow to schedule a follow-up appointment.  You have untreated diabetes.  It is extremely important you take the medications prescribed for diabetes every single day.  Please stop drinking soda.  You need to eat less sweets.  Your blood sugars were too high, which can make you dehydrated, and make you urinate too often.  You need to drink plenty of water.

## 2021-02-23 NOTE — ED Notes (Signed)
Discontinue insulin drip per EDP

## 2021-02-23 NOTE — ED Notes (Signed)
Hooked patient up to the monitor patient is resting with call bell in reach 

## 2021-02-23 NOTE — ED Notes (Signed)
Pt d/c home per MD order. Discharge summary reviewed with pt, pt verbalizes understanding. No s/s of acute distress noted at discharge ; ambulatory .

## 2021-02-24 LAB — HEMOGLOBIN A1C
Hgb A1c MFr Bld: >15.5 % — ABNORMAL HIGH (ref 4.8–5.6)
Mean Plasma Glucose: >398 mg/dL

## 2021-04-05 ENCOUNTER — Other Ambulatory Visit: Payer: Self-pay

## 2021-04-05 ENCOUNTER — Ambulatory Visit (INDEPENDENT_AMBULATORY_CARE_PROVIDER_SITE_OTHER): Payer: Medicaid Other | Admitting: Podiatry

## 2021-04-05 ENCOUNTER — Encounter: Payer: Self-pay | Admitting: Podiatry

## 2021-04-05 DIAGNOSIS — M2042 Other hammer toe(s) (acquired), left foot: Secondary | ICD-10-CM

## 2021-04-05 DIAGNOSIS — E1140 Type 2 diabetes mellitus with diabetic neuropathy, unspecified: Secondary | ICD-10-CM

## 2021-04-05 DIAGNOSIS — M2041 Other hammer toe(s) (acquired), right foot: Secondary | ICD-10-CM

## 2021-04-05 DIAGNOSIS — E1149 Type 2 diabetes mellitus with other diabetic neurological complication: Secondary | ICD-10-CM

## 2021-05-25 ENCOUNTER — Other Ambulatory Visit: Payer: Self-pay

## 2021-05-25 ENCOUNTER — Encounter (HOSPITAL_COMMUNITY): Payer: Self-pay | Admitting: Emergency Medicine

## 2021-05-25 ENCOUNTER — Ambulatory Visit (HOSPITAL_COMMUNITY)
Admission: EM | Admit: 2021-05-25 | Discharge: 2021-05-25 | Disposition: A | Discharge status: Home / Self Care | Payer: Medicaid Other | Attending: Family Medicine | Admitting: Family Medicine

## 2021-05-25 DIAGNOSIS — R3589 Other polyuria: Secondary | ICD-10-CM

## 2021-05-25 DIAGNOSIS — R631 Polydipsia: Secondary | ICD-10-CM

## 2021-05-25 DIAGNOSIS — R632 Polyphagia: Secondary | ICD-10-CM

## 2021-05-25 DIAGNOSIS — Z7984 Long term (current) use of oral hypoglycemic drugs: Secondary | ICD-10-CM

## 2021-05-25 DIAGNOSIS — E1165 Type 2 diabetes mellitus with hyperglycemia: Secondary | ICD-10-CM

## 2021-05-25 LAB — POCT URINALYSIS DIPSTICK, ED / UC
Bilirubin Urine: NEGATIVE
Glucose, UA: >=1000 mg/dL — AB
Hgb urine dipstick: NEGATIVE
Ketones, ur: 15 mg/dL — AB
Leukocytes,Ua: NEGATIVE
Nitrite: NEGATIVE
Protein, ur: NEGATIVE mg/dL
Specific Gravity, Urine: 1.01 (ref 1.005–1.030)
Urobilinogen, UA: 0.2 mg/dL (ref 0.0–1.0)
pH: 5.5 (ref 5.0–8.0)

## 2021-05-25 LAB — CBG MONITORING, ED: Glucose-Capillary: 480 mg/dL — ABNORMAL HIGH (ref 70–99)

## 2021-05-25 MED ORDER — SITAGLIPTIN PHOSPHATE 50 MG PO TABS: 50.0000 mg | ORAL_TABLET | Freq: Every day | ORAL | 0 refills | Status: DC

## 2021-05-25 MED ORDER — METFORMIN HCL 1000 MG PO TABS: 1000.0000 mg | ORAL_TABLET | Freq: Two times a day (BID) | ORAL | 0 refills | Status: DC

## 2021-05-25 MED ORDER — JARDIANCE 25 MG PO TABS
25.0000 mg | ORAL_TABLET | Freq: Every day | ORAL | 0 refills | Status: DC
Start: 2021-05-25 — End: 2023-11-08

## 2021-05-25 NOTE — ED Triage Notes (Addendum)
Pt is present with dry mouth, thirst, and urinary incontinence. Pt states sx started one month ago. Pt states that he has not checked or taken his diabetes medication in over a month

## 2021-05-25 NOTE — ED Provider Notes (Signed)
[place]    CSN: [id number] Arrival date & time: 05/25/21  0109      History   Chief Complaint Chief Complaint  Patient presents with   Dehydration    HPI [name] is a 65 y.o. male.   Patient presenting today with concerns of about a month history of polyuria polydipsia and polyphagia.  He states he has significant dry mouth despite drinking lots of water.  He is also having muscle cramps in his legs and fatigue.  He states he has not taken his medications for his blood sugar in several years, states he was treated for diabetes twice now, once in [place] and once [place] in the states several years ago and states it was cured from those treatments so he stopped his medications.  PCP has prescribed multiple oral medications, including metformin, Januvia, Jardiance but he is not taking any of these.  Does not check his home blood sugars as again he states he does not have diabetes.  He thinks that his symptoms are caused from his thyroid goiter.  Does have a PCP but has not followed up regularly with them.  And asked about his dietary intake, states he eats very frequently and does not watch his diet, drinks lots of fruit juices and eats lots of carbohydrates.  Past Medical History:  Diagnosis Date   Arthritis    Knees   Diabetes mellitus without complication ([place])    Goiter    Multinodular Goiter with partial thyroidectomey in [place]  1998.  Ultrasound confirming diagnosis in 01/17/2014   Patient Active Problem List   Diagnosis Date Noted   Airway obstruction 09/17/2019   COVID-19 virus infection 11/13/2018   Acute on chronic respiratory failure with hypoxia ([place]) 11/13/2018   Type 2 diabetes mellitus ([place]) 11/13/2018   Unspecified vitamin D deficiency 03/20/2014   Dental cavities 03/20/2014   IFG (impaired fasting glucose) 02/26/2014   Multinodular goiter 02/26/2014   Goiter     Past Surgical History:  Procedure Laterality Date   APPENDECTOMY  2014    THYROID SURGERY  1998   Enlarged and painful thyroid   THYROID SURGERY  1990       Home Medications    Prior to Admission medications   Medication Sig Start Date End Date Taking? Authorizing Provider  atorvastatin (LIPITOR) 20 MG tablet Take 20 mg by mouth daily. 03/30/21   [provider]  blood glucose meter kit and supplies KIT Check sugars twice daily, in morning when fasting and in evening before dinner Patient not taking: No sig reported 02/08/16   Mack Hook, MD  JANUVIA 100 MG tablet Take 50 mg by mouth daily. 02/11/21   [provider]  JARDIANCE 25 MG TABS tablet Take 1 tablet (25 mg total) by mouth daily. 05/25/21   Volney American, PA-C  metFORMIN (GLUCOPHAGE) 1000 MG tablet Take 1,000 mg by mouth 2 (two) times daily. 02/11/21   [provider]  metFORMIN (GLUCOPHAGE) 1000 MG tablet Take 1 tablet (1,000 mg total) by mouth 2 (two) times daily. 05/25/21   Volney American, PA-C  metFORMIN (GLUCOPHAGE) 500 MG tablet Take 1 tablet (500 mg total) by mouth 2 (two) times daily. 09/30/20 02/23/21  Carlisle Cater, PA-C  metFORMIN (GLUCOPHAGE-XR) 500 MG 24 hr tablet Take 500 mg by mouth daily. 11/02/20   [provider]  nystatin (MYCOSTATIN) 100000 UNIT/ML suspension Take by mouth. 03/24/21   [provider]  sitaGLIPtin (JANUVIA) 50 MG tablet  Take 1 tablet (50 mg total) by mouth daily. 05/25/21   Volney American, PA-C  albuterol (VENTOLIN HFA) 108 (90 Base) MCG/ACT inhaler Inhale 2 puffs into the lungs every 6 (six) hours as needed for wheezing or shortness of breath. Patient not taking: Reported on 09/30/2020 08/10/20 09/30/20  Providence Lanius A, PA-C  pantoprazole (PROTONIX) 20 MG tablet Take 1 tablet (20 mg total) by mouth daily. Patient not taking: Reported on 09/30/2020 08/10/20 09/30/20  Volanda Napoleon, PA-C    Family History Family History  Problem Relation Age of Onset   Arthritis Sister    Arthritis Brother     Prostate cancer Brother     Social History Social History   Tobacco Use   Smoking status: Never   Smokeless tobacco: Never  Vaping Use   Vaping Use: Never used  Substance Use Topics   Alcohol use: No    Alcohol/week: 0.0 standard drinks   Drug use: No     Allergies   Patient has no known allergies.   Review of Systems Review of Systems Per HPI  Physical Exam Triage Vital Signs ED Triage Vitals  Enc Vitals Group     BP 05/25/21 1056 114/71     Pulse Rate 05/25/21 1056 77     Resp 05/25/21 1056 18     Temp 05/25/21 1056 98.9 F (37.2 C)     Temp src --      SpO2 05/25/21 1056 97 %     Weight --      Height --      Head Circumference --      Peak Flow --      Pain Score 05/25/21 1055 0     Pain Loc --      Pain Edu? --      Excl. in [place]? --    No data found.  Updated Vital Signs BP 114/71   Pulse 77   Temp 98.9 F (37.2 C)   Resp 18   SpO2 97%   Visual Acuity Right Eye Distance:   Left Eye Distance:   Bilateral Distance:    Right Eye Near:   Left Eye Near:    Bilateral Near:     Physical Exam Vitals and nursing note reviewed.  Constitutional:      Appearance: Normal appearance.  HENT:     Head: Atraumatic.     Mouth/Throat:     Mouth: Mucous membranes are moist.     Pharynx: Oropharynx is clear.  Eyes:     Extraocular Movements: Extraocular movements intact.     Conjunctiva/sclera: Conjunctivae normal.  Cardiovascular:     Rate and Rhythm: Normal rate and regular rhythm.  Pulmonary:     Effort: Pulmonary effort is normal.     Breath sounds: Normal breath sounds.  Musculoskeletal:        General: Normal range of motion.     Cervical back: Normal range of motion and neck supple.  Skin:    General: Skin is warm and dry.  Neurological:     General: No focal deficit present.     Mental Status: He is oriented to person, place, and time.  Psychiatric:        Mood and Affect: Mood normal.        Behavior: Behavior normal.     UC  Treatments / Results  Labs (all labs ordered are listed, but only abnormal results are displayed) Labs Reviewed  POCT URINALYSIS DIPSTICK, ED / UC -  Abnormal; Notable for the following components:      Result Value   Glucose, UA >=1000 (*)    Ketones, ur 15 (*)    All other components within normal limits  CBG MONITORING, ED - Abnormal; Notable for the following components:   Glucose-Capillary 480 (*)    All other components within normal limits    EKG   Radiology No results found.  Procedures Procedures (including critical care time)  Medications Ordered in UC Medications - No data to display  Initial Impression / Assessment and Plan / UC Course  I have reviewed the triage vital signs and the nursing notes.  Pertinent labs & imaging results that were available during my care of the patient were reviewed by me and considered in my medical decision making (see chart for details).     Long discussion was had with patient regarding presence of significantly poorly controlled diabetes and that this is an ongoing, lifelong condition and not something that can be cured with medications in the short-term.  His blood sugar today on point-of-care testing is 480.  His urinalysis shows no evidence of a urinary tract infection but does show very small amount of ketones and significant glucose.  Per chart review, was in the hospital 02/2021 with blood sugars in the high 700s requiring insulin, IV fluids and restarting of oral medications to get back down into the 200s.  He has not since followed up with his primary care provider as he states he does not have diabetes.  Reiterated the critical nature of keeping his diabetes under good control as this can be life-threatening and cause severe complications down the line.  We will restart his oral medications that he is to be on and have him follow-up this week with his primary care provider for med adjustment and rechecks.  We will forego any insulin  therapy in this setting given his historical noncompliance and risks of dropping blood sugars too low if misused.  Discussed importance of dietary control, good exercise regimen additionally to help control blood sugars.  Go to the emergency department if symptoms worsen at any time or blood sugars are not improving on oral medications.  45 minutes was spent today in direct patient care, education, coordination of care.  Final Clinical Impressions(s) / UC Diagnoses   Final diagnoses:  Type 2 diabetes mellitus with hyperglycemia, without long-term current use of insulin (HCC)  Polyuria  Polyphagia  Polydipsia     Discharge Instructions      It is critically important that you begin to closely monitor your home blood sugars, take your medications as prescribed, follow-up closely with your primary care provider to get your diabetes under better control.  You have had significantly elevated blood sugars for quite some time now due to medication noncompliance and this can cause life-threatening complications if left untreated.  It is also very important that you begin to monitor your diet closely, reducing your sugar and simple carbohydrate intake as discussed.     ED Prescriptions     Medication Sig Dispense Auth. Provider   metFORMIN (GLUCOPHAGE) 1000 MG tablet Take 1 tablet (1,000 mg total) by mouth 2 (two) times daily. 60 tablet Volney American, PA-C   sitaGLIPtin (JANUVIA) 50 MG tablet Take 1 tablet (50 mg total) by mouth daily. 30 tablet Volney American, [place]   JARDIANCE 25 MG TABS tablet Take 1 tablet (25 mg total) by mouth daily. 30 tablet Volney American, [place]  PDMP not reviewed this encounter.   Volney American, [place] 05/25/21 1207

## 2021-05-25 NOTE — Discharge Instructions (Signed)
It is critically important that you begin to closely monitor your home blood sugars, take your medications as prescribed, follow-up closely with your primary care provider to get your diabetes under better control.  You have had significantly elevated blood sugars for quite some time now due to medication noncompliance and this can cause life-threatening complications if left untreated.  It is also very important that you begin to monitor your diet closely, reducing your sugar and simple carbohydrate intake as discussed.

## 2021-08-12 ENCOUNTER — Other Ambulatory Visit: Payer: Self-pay

## 2021-08-12 ENCOUNTER — Emergency Department (HOSPITAL_COMMUNITY)
Admission: EM | Admit: 2021-08-12 | Discharge: 2021-08-12 | Disposition: A | Discharge status: Home / Self Care | Payer: Medicaid Other | Attending: Emergency Medicine | Admitting: Emergency Medicine

## 2021-08-12 ENCOUNTER — Encounter (HOSPITAL_COMMUNITY): Payer: Self-pay

## 2021-08-12 DIAGNOSIS — H9201 Otalgia, right ear: Secondary | ICD-10-CM | POA: Insufficient documentation

## 2021-08-12 DIAGNOSIS — K0889 Other specified disorders of teeth and supporting structures: Secondary | ICD-10-CM | POA: Diagnosis present

## 2021-08-12 DIAGNOSIS — K029 Dental caries, unspecified: Secondary | ICD-10-CM | POA: Diagnosis not present

## 2021-08-12 DIAGNOSIS — K047 Periapical abscess without sinus: Secondary | ICD-10-CM | POA: Diagnosis not present

## 2021-08-12 DIAGNOSIS — H60391 Other infective otitis externa, right ear: Secondary | ICD-10-CM

## 2021-08-12 MED ORDER — AMOXICILLIN-POT CLAVULANATE 875-125 MG PO TABS
1.0000 | ORAL_TABLET | Freq: Once | ORAL | Status: AC
  Administered 2021-08-12: 1 via ORAL
  Filled 2021-08-12: qty 1

## 2021-08-12 MED ORDER — CIPRO HC 0.2-1 % OT SUSP: 3.0000 [drp] | Freq: Two times a day (BID) | OTIC | 0 refills | Status: DC

## 2021-08-12 MED ORDER — IBUPROFEN 400 MG PO TABS
400.0000 mg | ORAL_TABLET | Freq: Once | ORAL | Status: AC
  Administered 2021-08-12: 400 mg via ORAL
  Filled 2021-08-12: qty 1

## 2021-08-12 MED ORDER — AMOXICILLIN-POT CLAVULANATE 875-125 MG PO TABS: 1.0000 | ORAL_TABLET | Freq: Two times a day (BID) | ORAL | 0 refills | Status: DC

## 2021-08-12 MED ORDER — ACETAMINOPHEN 500 MG PO TABS
1000.0000 mg | ORAL_TABLET | Freq: Once | ORAL | Status: AC
  Administered 2021-08-12: 1000 mg via ORAL
  Filled 2021-08-12: qty 2

## 2021-08-12 NOTE — ED Provider Triage Note (Signed)
Emergency Medicine Provider Triage Evaluation Note  Joanne Brander , a 66 y.o. male  was evaluated in triage.  Pt complains of right ear pain.  2 days, pain to the jaw as well.  Denies any fevers at home..  Review of Systems  Positive: Ear pain Negative: FEVER  Physical Exam  BP 130/80 (BP Location: Left Arm)    Pulse 97    Temp 98.7 F (37.1 C) (Oral)    Resp 16    Ht 6' (1.829 m)    Wt 99.8 kg    SpO2 97%    BMI 29.84 kg/m  Gen:   Awake, no distress   Resp:  Normal effort  MSK:   Moves extremities without difficulty  Other:  Tragal tenderness.  Unable to fully visualize the TM  Medical Decision Making  Medically screening exam initiated at 6:17 PM.  Appropriate orders placed.  Gaelen Brager was informed that the remainder of the evaluation will be completed by another provider, this initial triage assessment does not replace that evaluation, and the importance of remaining in the ED until their evaluation is complete.     Sherrill Raring, PA-C 08/12/21 1818

## 2021-08-12 NOTE — ED Triage Notes (Signed)
Pt arrived via POV for c/o right ear itching, throbbing and inner ear swollen per ptx2 days. Pt denies trouble hearing out of it.

## 2021-08-12 NOTE — Discharge Instructions (Addendum)
It was our pleasure to provide your ER care today - we hope that you feel better.  Take augmentin (antibiotic) as prescribed. Use ear drops as prescribed.   Take acetaminophen or ibuprofen as need.   For ear, follow up with ENT doctor in 1-2 weeks if symptoms fail to improve/resolve.  For mouth/dental pain, follow up with dentist in the coming week.   Return to ER if worse, new symptoms, fevers, increased swelling, spreading redness, severe pain, severe headache, or other concern.

## 2021-08-12 NOTE — ED Provider Notes (Signed)
[facility] EMERGENCY DEPARTMENT Provider Note   CSN: [id number] Arrival date & time: 08/12/21  1633     History  Chief Complaint  Patient presents with   Otalgia    Trevaris Pennella is a 66 y.o. male.  Patient with c/o right ear/ear canal pain in past 2-3 days. Symptoms acute onset, moderate, persistent, dull, non  radiating. Denies trauma to area. No pus/drainage from area. No hearing loss. No dizziness. Also notes dental pain right mouth. No sore throat, or trouble swallowing. No facial swelling or redness.  No fever or chills. No headache. No neck swelling, pain or stiffness.   The history is provided by medical records.  Otalgia Associated symptoms: no cough, no fever, no headaches, no neck pain, no rash, no sore throat and no vomiting       Home Medications Prior to Admission medications   Medication Sig Start Date End Date Taking? Authorizing Provider  atorvastatin (LIPITOR) 20 MG tablet Take 20 mg by mouth daily. 03/30/21   [provider]  blood glucose meter kit and supplies KIT Check sugars twice daily, in morning when fasting and in evening before dinner Patient not taking: No sig reported 02/08/16   Mack Hook, MD  JANUVIA 100 MG tablet Take 50 mg by mouth daily. 02/11/21   [provider]  JARDIANCE 25 MG TABS tablet Take 1 tablet (25 mg total) by mouth daily. 05/25/21   Volney American, PA-C  metFORMIN (GLUCOPHAGE) 1000 MG tablet Take 1,000 mg by mouth 2 (two) times daily. 02/11/21   [provider]  metFORMIN (GLUCOPHAGE) 1000 MG tablet Take 1 tablet (1,000 mg total) by mouth 2 (two) times daily. 05/25/21   Volney American, PA-C  metFORMIN (GLUCOPHAGE) 500 MG tablet Take 1 tablet (500 mg total) by mouth 2 (two) times daily. 09/30/20 02/23/21  Carlisle Cater, PA-C  metFORMIN (GLUCOPHAGE-XR) 500 MG 24 hr tablet Take 500 mg by mouth daily. 11/02/20   [provider]  nystatin (MYCOSTATIN) 100000 UNIT/ML  suspension Take by mouth. 03/24/21   [provider]  sitaGLIPtin (JANUVIA) 50 MG tablet Take 1 tablet (50 mg total) by mouth daily. 05/25/21   Volney American, PA-C  albuterol (VENTOLIN HFA) 108 (90 Base) MCG/ACT inhaler Inhale 2 puffs into the lungs every 6 (six) hours as needed for wheezing or shortness of breath. Patient not taking: Reported on 09/30/2020 08/10/20 09/30/20  Providence Lanius A, PA-C  pantoprazole (PROTONIX) 20 MG tablet Take 1 tablet (20 mg total) by mouth daily. Patient not taking: Reported on 09/30/2020 08/10/20 09/30/20  Volanda Napoleon, PA-C      Allergies    Patient has no known allergies.    Review of Systems   Review of Systems  Constitutional:  Negative for fever.  HENT:  Positive for ear pain. Negative for sore throat and trouble swallowing.        Right dental pain.   Eyes:  Negative for discharge and redness.  Respiratory:  Negative for cough.   Cardiovascular:  Negative for chest pain.  Gastrointestinal:  Negative for vomiting.  Genitourinary:  Negative for flank pain.  Musculoskeletal:  Negative for neck pain and neck stiffness.  Skin:  Negative for rash.  Neurological:  Negative for headaches.  Hematological:  Does not bruise/bleed easily.  Psychiatric/Behavioral:  Negative for confusion.    Physical Exam Updated Vital Signs BP 130/80 (BP Location: Left Arm)    Pulse 97    Temp 98.7 F (37.1 C) (  Oral)    Resp 16    Ht 1.829 m (6')    Wt 99.8 kg    SpO2 97%    BMI 29.84 kg/m  Physical Exam Vitals and nursing note reviewed.  Constitutional:      Appearance: Normal appearance. He is well-developed.  HENT:     Head: Atraumatic.     Ears:     Comments: Right eac mildly swollen, tender c/w otitis externa. No fluctuance/abscess noted. No mastoid tenderness. TM partially visualized/intact.     Nose: Nose normal. No congestion or rhinorrhea.     Mouth/Throat:     Mouth: Mucous membranes are moist.     Pharynx: Oropharynx is clear.      Comments: Several dental caries. Right upper molar, chronically broken off appearing, associated mild gum swelling/tenderness. No trismus. Pharynx normal. No swelling, pain or tenderness to floor of mouth or neck.  Eyes:     General: No scleral icterus.    Conjunctiva/sclera: Conjunctivae normal.     Pupils: Pupils are equal, round, and reactive to light.  Neck:     Vascular: No carotid bruit.     Trachea: No tracheal deviation.     Comments: No stiffness or rigidity.  Cardiovascular:     Rate and Rhythm: Normal rate.     Pulses: Normal pulses.  Pulmonary:     Effort: Pulmonary effort is normal. No accessory muscle usage or respiratory distress.  Abdominal:     General: There is no distension.  Genitourinary:    Comments: No cva tenderness. Musculoskeletal:        General: No swelling.     Cervical back: Normal range of motion and neck supple. No rigidity.  Lymphadenopathy:     Cervical: No cervical adenopathy.  Skin:    General: Skin is warm and dry.     Findings: No rash.  Neurological:     Mental Status: He is alert.     Comments: Alert, speech clear. Steady gait.   Psychiatric:        Mood and Affect: Mood normal.    ED Results / Procedures / Treatments   Labs (all labs ordered are listed, but only abnormal results are displayed) Labs Reviewed - No data to display  EKG None  Radiology No results found.  Procedures Procedures    Medications Ordered in ED Medications - No data to display  ED Course/ Medical Decision Making/ A&P                           Medical Decision Making Problems Addressed: Acute infective otitis externa, right: acute illness or injury Dental abscess: acute illness or injury Dental caries: acute illness or injury  Amount and/or Complexity of Data Reviewed External Data Reviewed: labs and notes.  Risk OTC drugs. Prescription drug management.   Confirmed nkda w pt.   Augmentin po. Acetaminophen po. Ibuprofen po.  Reviewed  nursing notes and prior charts for additional history. External reports reviewed.   Pt appears stable for d/c.   Rx sent to pharmacy.   Return precautions provided.             Final Clinical Impression(s) / ED Diagnoses Final diagnoses:  None    Rx / DC Orders ED Discharge Orders     None         Lajean Saver, MD 08/12/21 2008

## 2021-08-13 ENCOUNTER — Telehealth: Payer: Self-pay

## 2021-08-13 NOTE — Telephone Encounter (Signed)
ED RNCM received call from patient and son Amad (ph# [phone]) indicating patient can not afford the medication at the pharmacy. Son not sure of the medications indicated it starts with an "A".   This ED RNCM called pharmacy to determine which medication patient needed assistance with. Patient picked up Amoxicillin yesterday.  This RNCM called patient and son Amad back to advise the medication is acetaminophen which is Tylenol and can be purchased OTC if they can not afford prescription. RNCM reached voicemail for both patient and son.

## 2021-08-18 ENCOUNTER — Ambulatory Visit: Payer: Medicaid Other | Admitting: Internal Medicine

## 2021-08-18 NOTE — Progress Notes (Deleted)
Name: Samuel [name]  MRN/ DOB: [id number], 04-25-1956   Age/ Sex: 66 y.o., male    PCP: [facility]   Reason for Endocrinology Evaluation: Type 2 Diabetes Mellitus     Date of Initial Endocrinology Visit: 08/18/2021     PATIENT IDENTIFIER: Samuel [name] is a 66 y.o. male with a past medical history of T2DM, MNG. The patient presented for initial endocrinology clinic visit on 08/18/2021 for consultative assistance with his diabetes management.    HPI: Samuel [name] was    Diagnosed with DM in 2017 Prior Medications tried/Intolerance: *** Currently checking blood sugars *** x / day,  before breakfast and ***.  Hypoglycemia episodes : ***               Symptoms: ***                 Frequency: ***/  Hemoglobin A1c has ranged from 6.8% in 2017, peaking at >15.0% in 2022. Patient required assistance for hypoglycemia:  Patient has required hospitalization within the last 1 year from hyper or hypoglycemia:   In terms of diet, the patient ***  THYROID HISTORY : He was noted to have MNG on thyroid ultrasound 09/09/2019. He has had hx of thyroid sx in 1990 and [place]: Januvia 100 mg daily  Jardiance 25 mg daily  Metformin 1000 mg BID     Statin: yes ACE-I/ARB: {YES/NO:17245} Prior Diabetic Education: {Yes/No:11203}   METER DOWNLOAD SUMMARY: Date range evaluated: *** Fingerstick Blood Glucose Tests = *** Average Number Tests/Day = *** Overall Mean FS Glucose = *** Standard Deviation = ***  BG Ranges: Low = *** High = ***   Hypoglycemic Events/30 Days: BG < 50 = *** Episodes of symptomatic severe hypoglycemia = ***   DIABETIC COMPLICATIONS: Microvascular complications:  *** Denies: CKD  Last eye exam: Completed   Macrovascular complications:  *** Denies: CAD, PVD, CVA   PAST HISTORY: Past Medical History:  Past Medical History:  Diagnosis Date   Arthritis    Knees   Diabetes mellitus without complication ([place])    Goiter     Multinodular Goiter with partial thyroidectomey in [place]  1998.  Ultrasound confirming diagnosis in 01/17/2014   Past Surgical History:  Past Surgical History:  Procedure Laterality Date   APPENDECTOMY  2014   THYROID SURGERY  1998   Enlarged and painful thyroid   THYROID SURGERY  1990    Social History:  reports that he has never smoked. He has never used smokeless tobacco. He reports that he does not drink alcohol and does not use drugs. Family History:  Family History  Problem Relation Age of Onset   Arthritis Sister    Arthritis Brother    Prostate cancer Brother      HOME MEDICATIONS: Allergies as of 08/18/2021   No Known Allergies      Medication List        Accurate as of August 18, 2021  7:12 AM. If you have any questions, ask your nurse or doctor.          amoxicillin-clavulanate 875-125 MG tablet Commonly known as: Augmentin Take 1 tablet by mouth 2 (two) times daily.   atorvastatin 20 MG tablet Commonly known as: LIPITOR Take 20 mg by mouth daily.   blood glucose meter kit and supplies Kit Check sugars twice daily, in morning when fasting and in evening before dinner   Cipro HC OTIC suspension Generic drug: ciprofloxacin-hydrocortisone Place  3 drops into the right ear 2 (two) times daily. For five days   Januvia 100 MG tablet Generic drug: sitaGLIPtin Take 50 mg by mouth daily.   sitaGLIPtin 50 MG tablet Commonly known as: Januvia Take 1 tablet (50 mg total) by mouth daily.   Jardiance 25 MG Tabs tablet Generic drug: empagliflozin Take 1 tablet (25 mg total) by mouth daily.   metFORMIN 500 MG tablet Commonly known as: GLUCOPHAGE Take 1 tablet (500 mg total) by mouth 2 (two) times daily.   metFORMIN 500 MG 24 hr tablet Commonly known as: GLUCOPHAGE-XR Take 500 mg by mouth daily.   metFORMIN 1000 MG tablet Commonly known as: GLUCOPHAGE Take 1,000 mg by mouth 2 (two) times daily.   metFORMIN 1000 MG tablet Commonly known as:  GLUCOPHAGE Take 1 tablet (1,000 mg total) by mouth 2 (two) times daily.   nystatin 100000 UNIT/ML suspension Commonly known as: MYCOSTATIN Take by mouth.         ALLERGIES: No Known Allergies   REVIEW OF SYSTEMS: A comprehensive ROS was conducted with the patient and is negative except as per HPI and below:  ROS    OBJECTIVE:   VITAL SIGNS: There were no vitals taken for this visit.   PHYSICAL EXAM:  General: Pt appears well and is in NAD  Hydration: Well-hydrated with moist mucous membranes and good skin turgor  HEENT: Head: Unremarkable with good dentition. Oropharynx clear without exudate.  Eyes: External eye exam normal without stare, lid lag or exophthalmos.  EOM intact.  PERRL.  Neck: General: Supple without adenopathy or carotid bruits. Thyroid: Thyroid size normal.  No goiter or nodules appreciated. No thyroid bruit.  Lungs: Clear with good BS bilat with no rales, rhonchi, or wheezes  Heart: RRR with normal S1 and S2 and no gallops; no murmurs; no rub  Abdomen: Normoactive bowel sounds, soft, nontender, without masses or organomegaly palpable  Extremities:  Lower extremities - No pretibial edema. No lesions.  Skin: Normal texture and temperature to palpation. No rash noted. No Acanthosis nigricans/skin tags. No lipohypertrophy.  Neuro: MS is good with appropriate affect, pt is alert and Ox3    DM foot exam:    DATA REVIEWED:  Lab Results  Component Value Date   HGBA1C >15.5 (H) 02/23/2021   HGBA1C >15.5 (H) 09/30/2020   HGBA1C 7.1 (H) 11/13/2018   Lab Results  Component Value Date   LDLCALC 100 (H) 10/07/2015   CREATININE 0.96 02/23/2021   No results found for: [facility]  Lab Results  Component Value Date   CHOL 167 10/07/2015   HDL 48 10/07/2015   LDLCALC 100 (H) 10/07/2015   TRIG 123 11/15/2018   CHOLHDL 3.4 03/12/2014       Thyroid ultrasound 09/09/2019   Estimated total number of nodules >/= 1 cm: 3   Number of spongiform nodules  >/=  2 cm not described below (TR1): 0   Number of mixed cystic and solid nodules >/= 1.5 cm not described below ([place]): 0   _________________________________________________________   Nodule # 3:   Prior biopsy: No   Location: Left; Inferior   Maximum size: 1.3 cm; Other 2 dimensions: 1.3 x 1.0 cm, previously, 1.3 cm   Composition: mixed cystic and solid (1)   Echogenicity: isoechoic (1)   Shape: not taller-than-wide (0)   Margins: smooth (0)   Echogenic foci: none (0)   ACR TI-RADS total points: 2.   ACR TI-RADS risk category:  TR2 (2 points).   Significant  change in size (>/= 20% in two dimensions and minimal increase of 2 mm): No   Change in features: No   Change in ACR TI-RADS risk category: No   ACR TI-RADS recommendations:   This nodule does NOT meet TI-RADS criteria for biopsy or dedicated follow-up. Previously this nodule met criteria for 1 year follow-up based on taller than wide shape. Clearly on the current study the nodule is not taller than wide.   _________________________________________________________   Right inferior and left mid solid/cystic nodules appears stable and continue to not meet criteria for biopsy or dedicated follow-up.   IMPRESSION: The 1.3 cm left inferior thyroid nodule no longer meets criteria for follow-up ultrasound. This nodule is clearly not taller than wide and now does not meet criteria for biopsy or dedicated follow-up. Two other previously identified partially solid and partially cystic nodules in the right inferior and left mid thyroid are stable and do not meet criteria for biopsy or dedicated follow-up.       ASSESSMENT / PLAN / RECOMMENDATIONS:   1) Type *** Diabetes Mellitus, ***controlled, With*** complications - Most recent A1c of *** %. Goal A1c < *** %.  ***  Plan: GENERAL: ***  MEDICATIONS: ***  EDUCATION / INSTRUCTIONS: BG monitoring instructions: Patient is instructed to check his blood  sugars *** times a day, ***. Call [facility] if: BG persistently < 70 or > 300. I reviewed the Rule of 15 for the treatment of hypoglycemia in detail with the patient. Literature supplied.   2) Diabetic complications:  Eye: Does *** have known diabetic retinopathy.  Neuro/ Feet: Does *** have known diabetic peripheral neuropathy. Renal: Patient does *** have known baseline CKD. He is *** on an ACEI/ARB at present.Check urine albumin/creatinine ratio yearly starting at time of diagnosis. If albuminuria is positive, treatment is geared toward better glucose, blood pressure control and use of ACE inhibitors or ARBs. Monitor electrolytes and creatinine once to twice yearly.   3) Lipids: Patient is *** on a statin.    4) Hypertension: ***  at goal of < 140/90 mmHg.       Signed electronically by: Mack Guise, MD  [facility]  [facility] [address], [place] [address] Phone: [phone] FAX: [phone]   CC: [facility], [facility] [address] [place] [address] Phone: [phone]  Fax: [phone]    Return to Endocrinology clinic as below: Future Appointments  Date Time Provider [place]  08/18/2021 10:30 AM Kennita Pavlovich, Melanie Crazier, MD [facility] None

## 2021-12-23 ENCOUNTER — Emergency Department (HOSPITAL_COMMUNITY)
Admission: EM | Admit: 2021-12-23 | Discharge: 2021-12-23 | Disposition: A | Discharge status: Home / Self Care | Payer: Medicaid Other | Attending: Emergency Medicine | Admitting: Emergency Medicine

## 2021-12-23 ENCOUNTER — Encounter (HOSPITAL_COMMUNITY): Payer: Self-pay

## 2021-12-23 ENCOUNTER — Other Ambulatory Visit: Payer: Self-pay

## 2021-12-23 DIAGNOSIS — Z7984 Long term (current) use of oral hypoglycemic drugs: Secondary | ICD-10-CM | POA: Diagnosis not present

## 2021-12-23 DIAGNOSIS — Z794 Long term (current) use of insulin: Secondary | ICD-10-CM | POA: Diagnosis not present

## 2021-12-23 DIAGNOSIS — R739 Hyperglycemia, unspecified: Secondary | ICD-10-CM | POA: Diagnosis not present

## 2021-12-23 DIAGNOSIS — E063 Autoimmune thyroiditis: Secondary | ICD-10-CM | POA: Insufficient documentation

## 2021-12-23 DIAGNOSIS — R39198 Other difficulties with micturition: Secondary | ICD-10-CM | POA: Diagnosis not present

## 2021-12-23 DIAGNOSIS — E049 Nontoxic goiter, unspecified: Secondary | ICD-10-CM

## 2021-12-23 LAB — CBC WITH DIFFERENTIAL/PLATELET
Abs Immature Granulocytes: 0.01 10*3/uL (ref 0.00–0.07)
Basophils Absolute: 0 10*3/uL (ref 0.0–0.1)
Basophils Relative: 1 %
Eosinophils Absolute: 0 10*3/uL (ref 0.0–0.5)
Eosinophils Relative: 1 %
HCT: 42.5 % (ref 39.0–52.0)
Hemoglobin: 14.1 g/dL (ref 13.0–17.0)
Immature Granulocytes: 0 %
Lymphocytes Relative: 42 %
Lymphs Abs: 1.8 10*3/uL (ref 0.7–4.0)
MCH: 27.8 pg (ref 26.0–34.0)
MCHC: 33.2 g/dL (ref 30.0–36.0)
MCV: 83.7 fL (ref 80.0–100.0)
Monocytes Absolute: 0.5 10*3/uL (ref 0.1–1.0)
Monocytes Relative: 11 %
Neutro Abs: 2 10*3/uL (ref 1.7–7.7)
Neutrophils Relative %: 45 %
Platelets: 220 10*3/uL (ref 150–400)
RBC: 5.08 MIL/uL (ref 4.22–5.81)
RDW: 12.5 % (ref 11.5–15.5)
WBC: 4.3 10*3/uL (ref 4.0–10.5)
nRBC: 0 % (ref 0.0–0.2)

## 2021-12-23 LAB — CBG MONITORING, ED
Glucose-Capillary: 325 mg/dL — ABNORMAL HIGH (ref 70–99)
Glucose-Capillary: 284 mg/dL — ABNORMAL HIGH (ref 70–99)

## 2021-12-23 LAB — BASIC METABOLIC PANEL
Anion gap: 8 (ref 5–15)
BUN: 14 mg/dL (ref 8–23)
CO2: 25 mmol/L (ref 22–32)
Calcium: 9 mg/dL (ref 8.9–10.3)
Chloride: 105 mmol/L (ref 98–111)
Creatinine, Ser: 0.81 mg/dL (ref 0.61–1.24)
GFR, Estimated: >60 mL/min (ref >60)
Glucose, Bld: 331 mg/dL — ABNORMAL HIGH (ref 70–99)
Potassium: 3.9 mmol/L (ref 3.5–5.1)
Sodium: 138 mmol/L (ref 135–145)

## 2021-12-23 LAB — URINALYSIS, ROUTINE W REFLEX MICROSCOPIC
Bacteria, UA: NONE SEEN
Bilirubin Urine: NEGATIVE
Glucose, UA: >=500 mg/dL — AB
Hgb urine dipstick: NEGATIVE
Ketones, ur: 20 mg/dL — AB
Leukocytes,Ua: NEGATIVE
Nitrite: NEGATIVE
Protein, ur: NEGATIVE mg/dL
Specific Gravity, Urine: 1.035 — ABNORMAL HIGH (ref 1.005–1.030)
pH: 6 (ref 5.0–8.0)

## 2021-12-23 MED ORDER — INSULIN ASPART 100 UNIT/ML IJ SOLN
8.0000 [IU] | Freq: Once | INTRAMUSCULAR | Status: AC
  Administered 2021-12-23: 8 [IU] via SUBCUTANEOUS

## 2021-12-23 NOTE — ED Triage Notes (Signed)
Pt arrived POV from home c/o dry mouth and feeling like his bladder is full. Pt denies any pain just states it does not feel normal.

## 2021-12-23 NOTE — ED Provider Notes (Signed)
[facility] EMERGENCY DEPARTMENT Provider Note   CSN: [id number] Arrival date & time: 12/23/21  7341     History  Chief Complaint  Patient presents with   Urinary Retention    Samuel [name] is a 66 y.o. male.  Patient presents with multiple complaints.  He states that his goiter is bothering him, he has not been taking his diabetes medicine, feels like his bladder feels full.  Symptoms ongoing for several weeks he states.  He does not like going back to his primary care doctor's office because he feels like somebody in the office cast a spell on him causing him to become sick.  Otherwise denies any headache or chest pain or abdominal pain.  No fever no cough no vomiting or diarrhea.      Home Medications Prior to Admission medications   Medication Sig Start Date End Date Taking? Authorizing Provider  amoxicillin-clavulanate (AUGMENTIN) 875-125 MG tablet Take 1 tablet by mouth 2 (two) times daily. 08/12/21   Lajean Saver, MD  atorvastatin (LIPITOR) 20 MG tablet Take 20 mg by mouth daily. 03/30/21   [provider]  blood glucose meter kit and supplies KIT Check sugars twice daily, in morning when fasting and in evening before dinner Patient not taking: No sig reported 02/08/16   Mack Hook, MD  ciprofloxacin-hydrocortisone (CIPRO [facility]) OTIC suspension Place 3 drops into the right ear 2 (two) times daily. For five days 08/12/21   Lajean Saver, MD  JANUVIA 100 MG tablet Take 50 mg by mouth daily. 02/11/21   [provider]  JARDIANCE 25 MG TABS tablet Take 1 tablet (25 mg total) by mouth daily. 05/25/21   Volney American, PA-C  metFORMIN (GLUCOPHAGE) 1000 MG tablet Take 1,000 mg by mouth 2 (two) times daily. 02/11/21   [provider]  metFORMIN (GLUCOPHAGE) 1000 MG tablet Take 1 tablet (1,000 mg total) by mouth 2 (two) times daily. 05/25/21   Volney American, PA-C  metFORMIN (GLUCOPHAGE) 500 MG tablet Take 1 tablet (500 mg  total) by mouth 2 (two) times daily. 09/30/20 02/23/21  Carlisle Cater, PA-C  metFORMIN (GLUCOPHAGE-XR) 500 MG 24 hr tablet Take 500 mg by mouth daily. 11/02/20   [provider]  nystatin (MYCOSTATIN) 100000 UNIT/ML suspension Take by mouth. 03/24/21   [provider]  sitaGLIPtin (JANUVIA) 50 MG tablet Take 1 tablet (50 mg total) by mouth daily. 05/25/21   Volney American, PA-C  albuterol (VENTOLIN HFA) 108 (90 Base) MCG/ACT inhaler Inhale 2 puffs into the lungs every 6 (six) hours as needed for wheezing or shortness of breath. Patient not taking: Reported on 09/30/2020 08/10/20 09/30/20  Providence Lanius A, PA-C  pantoprazole (PROTONIX) 20 MG tablet Take 1 tablet (20 mg total) by mouth daily. Patient not taking: Reported on 09/30/2020 08/10/20 09/30/20  Volanda Napoleon, PA-C      Allergies    Patient has no known allergies.    Review of Systems   Review of Systems  Constitutional:  Negative for fever.  HENT:  Negative for ear pain and sore throat.   Eyes:  Negative for pain.  Respiratory:  Negative for cough.   Cardiovascular:  Negative for chest pain.  Gastrointestinal:  Negative for abdominal pain.  Genitourinary:  Negative for flank pain.  Musculoskeletal:  Negative for back pain.  Skin:  Negative for color change and rash.  Neurological:  Negative for syncope.  All other systems reviewed and are negative.  Physical Exam Updated Vital Signs  BP 114/86 (BP Location: Right Arm)   Pulse 72   Temp 98.5 F (36.9 C) (Oral)   Resp 16   Ht 6' (1.829 m)   Wt 99.8 kg   SpO2 98%   BMI 29.84 kg/m  Physical Exam Constitutional:      Appearance: He is well-developed.  HENT:     Head: Normocephalic.     Nose: Nose normal.  Eyes:     Extraocular Movements: Extraocular movements intact.  Neck:     Comments: Prominent frontal goiter noted.  No breathing difficulty no deformity of airway noted.  Full range of motion of the neck without pain. Cardiovascular:     Rate and  Rhythm: Normal rate.  Pulmonary:     Effort: Pulmonary effort is normal.  Abdominal:     Tenderness: There is no abdominal tenderness. There is no guarding or rebound.  Skin:    Coloration: Skin is not jaundiced.  Neurological:     General: No focal deficit present.     Mental Status: He is alert and oriented to person, place, and time. Mental status is at baseline.    ED Results / Procedures / Treatments   Labs (all labs ordered are listed, but only abnormal results are displayed) Labs Reviewed  URINALYSIS, ROUTINE W REFLEX MICROSCOPIC - Abnormal; Notable for the following components:      Result Value   Specific Gravity, Urine 1.035 (*)    Glucose, UA >=500 (*)    Ketones, ur 20 (*)    All other components within normal limits  BASIC METABOLIC PANEL - Abnormal; Notable for the following components:   Glucose, Bld 331 (*)    All other components within normal limits  CBG MONITORING, ED - Abnormal; Notable for the following components:   Glucose-Capillary 325 (*)    All other components within normal limits  CBC WITH DIFFERENTIAL/PLATELET    EKG None  Radiology No results found.  Procedures Procedures    Medications Ordered in ED Medications  insulin aspart (novoLOG) injection 8 Units (8 Units Subcutaneous Given 12/23/21 1206)    ED Course/ Medical Decision Making/ A&P                           Medical Decision Making Amount and/or Complexity of Data Reviewed Labs: ordered.  Risk Prescription drug management.   Review of records shows visit with his primary care doctor for his goiter in March 2023.  Cardiac monitoring shows sinus rhythm.  Diagnosis recent for hemoglobin 14 chemistry shows glucose of 324 anion gap is normal bicarb is normal.  Patient able to urinate here and given a good amount of urine.  Postvoid bladder scan shows 150 cc of urine.  Patient appears able to urinate, declines indwelling Foley catheter.  Given insulin for his hyperglycemia.   Advised outpatient follow-up with his primary care doctor for continued management of his goiter.  We will give him a referral to urology for difficulty urinating.  Recommended immediate return for fevers worsening symptoms or any additional concerns.        Final Clinical Impression(s) / ED Diagnoses Final diagnoses:  Difficulty urinating  Goiter    Rx / DC Orders ED Discharge Orders     None         Luna Fuse, MD 12/23/21 1225

## 2021-12-23 NOTE — ED Notes (Signed)
Pts post void urine 173m

## 2021-12-23 NOTE — Discharge Instructions (Signed)
Follow-up with your primary care doctor and urology specialists.  You will need to call to make an appointment for the next 1 to 2 weeks.  Return back to the ER if you have fevers worsening symptoms or any additional concerns.  Make sure you take your diabetes medications as continued elevated blood sugar can be a serious medical condition if not treated appropriately.

## 2021-12-23 NOTE — ED Notes (Signed)
Pt c/o being unable to urinate since last night. Pt woke up feeling nauseous. Denies any pain but still having dry mouth

## 2022-01-04 ENCOUNTER — Encounter (HOSPITAL_COMMUNITY): Payer: Self-pay | Admitting: Emergency Medicine

## 2022-01-04 ENCOUNTER — Ambulatory Visit (HOSPITAL_COMMUNITY)
Admission: EM | Admit: 2022-01-04 | Discharge: 2022-01-04 | Disposition: A | Discharge status: Home / Self Care | Payer: Medicaid Other | Attending: Physician Assistant | Admitting: Physician Assistant

## 2022-01-04 ENCOUNTER — Emergency Department (HOSPITAL_COMMUNITY)
Admission: EM | Admit: 2022-01-04 | Discharge: 2022-01-05 | Disposition: A | Discharge status: Home / Self Care | Payer: Medicaid Other | Attending: Emergency Medicine | Admitting: Emergency Medicine

## 2022-01-04 DIAGNOSIS — E1165 Type 2 diabetes mellitus with hyperglycemia: Secondary | ICD-10-CM

## 2022-01-04 DIAGNOSIS — R110 Nausea: Secondary | ICD-10-CM | POA: Diagnosis not present

## 2022-01-04 DIAGNOSIS — Z7984 Long term (current) use of oral hypoglycemic drugs: Secondary | ICD-10-CM | POA: Insufficient documentation

## 2022-01-04 DIAGNOSIS — R824 Acetonuria: Secondary | ICD-10-CM | POA: Diagnosis not present

## 2022-01-04 DIAGNOSIS — R5383 Other fatigue: Secondary | ICD-10-CM | POA: Diagnosis not present

## 2022-01-04 DIAGNOSIS — Z8616 Personal history of COVID-19: Secondary | ICD-10-CM | POA: Insufficient documentation

## 2022-01-04 DIAGNOSIS — R739 Hyperglycemia, unspecified: Secondary | ICD-10-CM

## 2022-01-04 LAB — POCT URINALYSIS DIPSTICK, ED / UC
Bilirubin Urine: NEGATIVE
Glucose, UA: >=1000 mg/dL — AB
Hgb urine dipstick: NEGATIVE
Ketones, ur: 80 mg/dL — AB
Leukocytes,Ua: NEGATIVE
Nitrite: NEGATIVE
Protein, ur: NEGATIVE mg/dL
Specific Gravity, Urine: 1.015 (ref 1.005–1.030)
Urobilinogen, UA: 0.2 mg/dL (ref 0.0–1.0)
pH: 5 (ref 5.0–8.0)

## 2022-01-04 LAB — BASIC METABOLIC PANEL
Anion gap: 9 (ref 5–15)
BUN: 25 mg/dL — ABNORMAL HIGH (ref 8–23)
CO2: 23 mmol/L (ref 22–32)
Calcium: 8.9 mg/dL (ref 8.9–10.3)
Chloride: 100 mmol/L (ref 98–111)
Creatinine, Ser: 0.97 mg/dL (ref 0.61–1.24)
GFR, Estimated: >60 mL/min (ref >60)
Glucose, Bld: 434 mg/dL — ABNORMAL HIGH (ref 70–99)
Potassium: 4.5 mmol/L (ref 3.5–5.1)
Sodium: 132 mmol/L — ABNORMAL LOW (ref 135–145)

## 2022-01-04 LAB — URINALYSIS, ROUTINE W REFLEX MICROSCOPIC
Bacteria, UA: NONE SEEN
Bilirubin Urine: NEGATIVE
Glucose, UA: >=500 mg/dL — AB
Hgb urine dipstick: NEGATIVE
Ketones, ur: 20 mg/dL — AB
Leukocytes,Ua: NEGATIVE
Nitrite: NEGATIVE
Protein, ur: NEGATIVE mg/dL
Specific Gravity, Urine: 1.031 — ABNORMAL HIGH (ref 1.005–1.030)
pH: 5 (ref 5.0–8.0)

## 2022-01-04 LAB — CBG MONITORING, ED
Glucose-Capillary: 482 mg/dL — ABNORMAL HIGH (ref 70–99)
Glucose-Capillary: 406 mg/dL — ABNORMAL HIGH (ref 70–99)

## 2022-01-04 LAB — CBC
HCT: 42.1 % (ref 39.0–52.0)
Hemoglobin: 13.5 g/dL (ref 13.0–17.0)
MCH: 27.2 pg (ref 26.0–34.0)
MCHC: 32.1 g/dL (ref 30.0–36.0)
MCV: 84.7 fL (ref 80.0–100.0)
Platelets: 232 10*3/uL (ref 150–400)
RBC: 4.97 MIL/uL (ref 4.22–5.81)
RDW: 12.6 % (ref 11.5–15.5)
WBC: 5.2 10*3/uL (ref 4.0–10.5)
nRBC: 0 % (ref 0.0–0.2)

## 2022-01-04 NOTE — ED Notes (Signed)
Urine is labeled and placed in lab 

## 2022-01-04 NOTE — ED Triage Notes (Signed)
Patient c/o dry mouth and urinary frequency x 1 day.  Patient denies SOB. Patient denies dizziness.   Patient endorses fatigue.   Patient has history of DM.   Patient was recently seen at [facility] and patient brought paperwork stating his blood sugar was 451 at that office.

## 2022-01-04 NOTE — ED Notes (Signed)
Patient is being discharged from the Urgent Care and sent to the Emergency Department via pov . Per Rasprt, PA, patient is in need of higher level of care due to hyperglycemia. Patient is aware and verbalizes understanding of plan of care.  Vitals:   01/04/22 1430  BP: 107/73  Pulse: 97  Resp: 18  Temp: 98.4 F (36.9 C)  SpO2: 95%

## 2022-01-04 NOTE — ED Provider Notes (Signed)
[facility]    CSN: [id number] Arrival date & time: 01/04/22  1408      History   Chief Complaint Chief Complaint  Patient presents with   Dry mouth    Urinary Frequency    HPI Samuel [name] is a 66 y.o. male.   Patient presents today companied by his son who help provide the majority of history.  Patient has a history of diabetes was diagnosed several years ago.  He is followed by [facility] medical and was noted to have hyperglycemia with a blood sugar of 451 on 12/29/2021 and was told to be seen in urgent care.  A1c obtained at that time was 17.1% but CMP was appropriate.  Patient reports that he has been taking metformin but is not currently taking the Jardiance or Januvia listed on his medication list.  The past several days he has had worsening symptoms including severe nausea but without vomiting.  He also reports urinary frequency, generalized weakness, dry mouth, fatigue/malaise.  Denies any vomiting, shortness of breath.  He was seen in the emergency room on 12/23/2021 for other symptoms at which point he was noted to be slightly hyperglycemic with blood sugar 300+ and was given insulin during visit but no additional medication changes.  He was hospitalized several years ago due to hyperglycemia requiring insulin drip but has not been hospitalized more recently.  He is not currently followed regularly by primary care and has been receiving diabetes medications intermittently from urgent care/primary care.  Denies outpatient prescription for insulin in the past.    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  Ultrasound confirming diagnosis in 01/17/2014    Patient Active Problem List   Diagnosis Date Noted   Airway obstruction 09/17/2019   COVID-19 virus infection 11/13/2018   Acute on chronic respiratory failure with hypoxia ([place])  11/13/2018   Type 2 diabetes mellitus ([place]) 11/13/2018   Unspecified vitamin D deficiency 03/20/2014   Dental cavities 03/20/2014   IFG (impaired fasting glucose) 02/26/2014   Multinodular goiter 02/26/2014   Goiter     Past Surgical History:  Procedure Laterality Date   APPENDECTOMY  2014   THYROID SURGERY  1998   Enlarged and painful thyroid   THYROID SURGERY  1990       Home Medications    Prior to Admission medications   Medication Sig Start Date End Date Taking? Authorizing Provider  metFORMIN (GLUCOPHAGE) 1000 MG tablet Take 1,000 mg by mouth 2 (two) times daily. 02/11/21  Yes [provider]  amoxicillin-clavulanate (AUGMENTIN) 875-125 MG tablet Take 1 tablet by mouth 2 (two) times daily. 08/12/21   Lajean Saver, MD  atorvastatin (LIPITOR) 20 MG tablet Take 20 mg by mouth daily. 03/30/21   [provider]  blood glucose meter kit and supplies KIT Check sugars twice daily, in morning when fasting and in evening before dinner Patient not taking: Reported on 09/30/2020 02/08/16   Mack Hook, MD  ciprofloxacin-hydrocortisone (CIPRO [facility]) OTIC suspension Place 3 drops into the right ear 2 (two) times daily. For five days 08/12/21   Lajean Saver, MD  JANUVIA 100 MG tablet Take 50 mg by mouth daily. 02/11/21   [provider]  JARDIANCE 25 MG TABS tablet Take 1 tablet (25 mg total) by mouth daily. 05/25/21   Volney American, PA-C  metFORMIN (GLUCOPHAGE) 1000 MG tablet Take 1 tablet (1,000 mg total) by mouth 2 (two) times daily. 05/25/21   Volney American, PA-C  metFORMIN (GLUCOPHAGE) 500 MG tablet Take 1 tablet (500 mg total) by mouth 2 (two) times daily. 09/30/20 02/23/21  Carlisle Cater, PA-C  metFORMIN (GLUCOPHAGE-XR) 500 MG 24 hr tablet Take 500 mg by mouth daily. 11/02/20   [provider]  nystatin (MYCOSTATIN) 100000 UNIT/ML suspension Take by mouth. 03/24/21   [provider]  sitaGLIPtin (JANUVIA) 50 MG tablet Take 1  tablet (50 mg total) by mouth daily. 05/25/21   Volney American, PA-C  albuterol (VENTOLIN HFA) 108 (90 Base) MCG/ACT inhaler Inhale 2 puffs into the lungs every 6 (six) hours as needed for wheezing or shortness of breath. Patient not taking: Reported on 09/30/2020 08/10/20 09/30/20  Providence Lanius A, PA-C  pantoprazole (PROTONIX) 20 MG tablet Take 1 tablet (20 mg total) by mouth daily. Patient not taking: Reported on 09/30/2020 08/10/20 09/30/20  Volanda Napoleon, PA-C    Family History Family History  Problem Relation Age of Onset   Arthritis Sister    Arthritis Brother    Prostate cancer Brother     Social History Social History   Tobacco Use   Smoking status: Never   Smokeless tobacco: Never  Vaping Use   Vaping Use: Never used  Substance Use Topics   Alcohol use: No    Alcohol/week: 0.0 standard drinks of alcohol   Drug use: No     Allergies   Patient has no known allergies.   Review of Systems Review of Systems  Constitutional:  Positive for activity change and fatigue. Negative for appetite change and fever.  Respiratory:  Negative for cough and shortness of breath.   Cardiovascular:  Negative for chest pain and palpitations.  Gastrointestinal:  Positive for nausea. Negative for abdominal pain, diarrhea and vomiting.  Endocrine: Positive for polydipsia, polyphagia and polyuria.  Neurological:  Positive for weakness (Generalized). Negative for dizziness, light-headedness and headaches.     Physical Exam Triage Vital Signs ED Triage Vitals  Enc Vitals Group     BP 01/04/22 1430 107/73     Pulse Rate 01/04/22 1430 97     Resp 01/04/22 1430 18     Temp 01/04/22 1430 98.4 F (36.9 C)     Temp Source 01/04/22 1430 Oral     SpO2 01/04/22 1430 95 %     Weight --      Height --      Head Circumference --      Peak Flow --      Pain Score 01/04/22 1426 6     Pain Loc --      Pain Edu? --      Excl. in [place]? --    No data found.  Updated Vital Signs BP  107/73 (BP Location: Left Arm)   Pulse 97   Temp 98.4 F (36.9 C) (Oral)   Resp 18   SpO2 95%   Visual Acuity Right Eye Distance:   Left Eye Distance:   Bilateral Distance:    Right Eye Near:   Left Eye Near:    Bilateral Near:     Physical Exam Vitals reviewed.  Constitutional:      General: He is awake.     Appearance: Normal appearance. He is well-developed. He is not ill-appearing.     Comments: Very pleasant male appears stated age in no acute distress sitting comfortably on exam room  table  HENT:     Head: Normocephalic and atraumatic.     Mouth/Throat:     Mouth: Mucous membranes are dry.  Cardiovascular:     Rate and Rhythm: Normal rate and regular rhythm.     Heart sounds: Normal heart sounds, S1 normal and S2 normal. No murmur heard. Pulmonary:     Effort: Pulmonary effort is normal.     Breath sounds: Normal breath sounds. No stridor. No wheezing, rhonchi or rales.     Comments: Clear to auscultation bilaterally Abdominal:     General: Bowel sounds are normal.     Palpations: Abdomen is soft.     Tenderness: There is no abdominal tenderness.  Neurological:     Mental Status: He is alert.  Psychiatric:        Behavior: Behavior is cooperative.      UC Treatments / Results  Labs (all labs ordered are listed, but only abnormal results are displayed) Labs Reviewed  CBG MONITORING, ED - Abnormal; Notable for the following components:      Result Value   Glucose-Capillary 482 (*)    All other components within normal limits  POCT URINALYSIS DIPSTICK, ED / UC - Abnormal; Notable for the following components:   Glucose, UA >=1000 (*)    Ketones, ur 80 (*)    All other components within normal limits    EKG   Radiology No results found.  Procedures Procedures (including critical care time)  Medications Ordered in UC Medications - No data to display  Initial Impression / Assessment and Plan / UC Course  I have reviewed the triage vital signs  and the nursing notes.  Pertinent labs & imaging results that were available during my care of the patient were reviewed by me and considered in my medical decision making (see chart for details).     Glucose was 482 in clinic today.  UA showed glucosuria and ketonuria.  Given patient is symptomatic concern for DKA versus HHS he was encouraged to go to the emergency room for further evaluation and management as he may need fluids and insulin drip which we are not capable of providing in urgent care setting.  Patient is agreeable and will have son take him to [facility] ER immediately after discharge for further evaluation and management.  Vital signs are stable at the time of discharge.  Patient is safe for private transport.  Discussed case with Dr. Mannie Stabile who agreed with treatment plan.  Final Clinical Impressions(s) / UC Diagnoses   Final diagnoses:  Hyperglycemia  Ketonuria  Nausea without vomiting  Uncontrolled type 2 diabetes mellitus with hyperglycemia [facility])     Discharge Instructions      Go to the emergency room for further evaluation and management     ED Prescriptions   None    PDMP not reviewed this encounter.   Terrilee Croak, PA-C 01/04/22 1520

## 2022-01-04 NOTE — ED Provider Triage Note (Signed)
Emergency Medicine Provider Triage Evaluation Note  Samuel [name] , a 66 y.o. male  was evaluated in triage.  Pt complains of hyperglycemia.  Was seen at [facility] on 6/2 and had labs drawn.  He had an A1c of 17.1 and TSH of 0.286.  Was just put on metformin, but picked it up today.  History of multinodular goiter with partial thyroidectomy in 1998, and had a confirmatory ultrasound 2015.  Has not seen a doctor for since then.  But states that his goiter has been bothering him.  Review of Systems  Positive: Swelling of goiter, nausea, vomiting, generalized fatigue Negative: Syncope, pain  Physical Exam  BP (!) 115/92 (BP Location: Right Arm)   Pulse 90   Temp 98.2 F (36.8 C) (Oral)   Resp 18   Ht '6\' 1"'$  (1.854 m)   Wt 89.8 kg   SpO2 100%   BMI 26.12 kg/m  Gen:   Awake, no distress   Resp:  Normal effort  MSK:   Moves extremities without difficulty  Other:    Medical Decision Making  Medically screening exam initiated at 4:02 PM.  Appropriate orders placed.  Samuel [name] was informed that the remainder of the evaluation will be completed by another provider, this initial triage assessment does not replace that evaluation, and the importance of remaining in the ED until their evaluation is complete.  CBG in triage 93 Samuel [name], Samuel [name], [place] 01/04/22 1605

## 2022-01-04 NOTE — ED Triage Notes (Addendum)
Pt supposed to be taking metformin for diabetes but has not been taking it. Presents with symptoms of hyperglycemia. Been going on for a couple of days. AIC from pcp on June 8th was 17.1

## 2022-01-04 NOTE — Discharge Instructions (Signed)
Go to the emergency room for further evaluation and management

## 2022-01-05 LAB — CBG MONITORING, ED: Glucose-Capillary: 308 mg/dL — ABNORMAL HIGH (ref 70–99)

## 2022-01-05 NOTE — ED Provider Notes (Signed)
[facility] EMERGENCY DEPARTMENT Provider Note  CSN: [id number] Arrival date & time: 01/04/22 1528  Chief Complaint(s) Hyperglycemia  HPI Samuel [name] is a 66 y.o. male with a past medical history listed below including type 2 diabetes who presents to the emergency department at the behest of urgent care.  Patient recently went back to his PCP at [place] after over a year since his prior visit.  During screening labs and noted patient was hypoglycemic.  Patient had been off of his metformin for several months.  After the results she was instructed to go to urgent care for further evaluation.  He was referred here to rule out DKA.   Patient presented back to [facility] due to [place] dyspnea and polyuria.  Patient also reports that he has been having generalized fatigue and dry mouth.  He has been having intermittent abdominal discomfort and chest discomfort.  Other than the dry mouth, patient currently denies any other physical complaints.  He denies any recent fevers or infections.  No nausea or vomiting.  Patient was started back on his metformin and took his first dose today.   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  Ultrasound confirming diagnosis in 01/17/2014   Patient Active Problem List   Diagnosis Date Noted   Airway obstruction 09/17/2019   COVID-19 virus infection 11/13/2018   Acute on chronic respiratory failure with hypoxia ([place]) 11/13/2018   Type 2 diabetes mellitus ([place]) 11/13/2018   Unspecified vitamin D deficiency 03/20/2014   Dental cavities 03/20/2014   IFG (impaired fasting glucose) 02/26/2014   Multinodular goiter 02/26/2014   Goiter    Home Medication(s) Prior to Admission medications   Medication Sig Start Date End Date Taking? Authorizing Provider   amoxicillin-clavulanate (AUGMENTIN) 875-125 MG tablet Take 1 tablet by mouth 2 (two) times daily. 08/12/21   Lajean Saver, MD  atorvastatin (LIPITOR) 20 MG tablet Take 20 mg by mouth daily. 03/30/21   [provider]  blood glucose meter kit and supplies KIT Check sugars twice daily, in morning when fasting and in evening before dinner Patient not taking: Reported on 09/30/2020 02/08/16   Mack Hook, MD  ciprofloxacin-hydrocortisone (CIPRO [facility]) OTIC suspension Place 3 drops into the right ear 2 (two) times daily. For five days 08/12/21   Lajean Saver, MD  JANUVIA 100 MG tablet Take 50 mg by mouth daily. 02/11/21   [provider]  JARDIANCE 25 MG TABS tablet Take 1 tablet (25 mg total) by mouth daily. 05/25/21   Volney American, PA-C  metFORMIN (GLUCOPHAGE) 1000 MG tablet Take 1,000 mg by mouth 2 (two) times daily. 02/11/21   [provider]  metFORMIN (GLUCOPHAGE) 1000 MG tablet Take 1 tablet (1,000 mg total) by mouth 2 (two) times daily. 05/25/21   Volney American, PA-C  metFORMIN (GLUCOPHAGE) 500 MG tablet Take 1 tablet (500 mg total) by mouth 2 (two) times daily. 09/30/20 02/23/21  Carlisle Cater, PA-C  metFORMIN (GLUCOPHAGE-XR) 500 MG 24 hr tablet Take 500 mg by mouth daily. 11/02/20   [provider]  nystatin (MYCOSTATIN) 100000 UNIT/ML suspension Take by mouth. 03/24/21   [provider]  sitaGLIPtin (JANUVIA) 50 MG tablet Take 1 tablet (50 mg total) by mouth daily. 05/25/21   Volney American, PA-C  albuterol (VENTOLIN  HFA) 108 (90 Base) MCG/ACT inhaler Inhale 2 puffs into the lungs every 6 (six) hours as needed for wheezing or shortness of breath. Patient not taking: Reported on 09/30/2020 08/10/20 09/30/20  Providence Lanius A, PA-C  pantoprazole (PROTONIX) 20 MG tablet Take 1 tablet (20 mg total) by mouth daily. Patient not taking: Reported on 09/30/2020 08/10/20 09/30/20  Volanda Napoleon, PA-C                                                                                                                                     Allergies Patient has no known allergies.  Review of Systems Review of Systems As noted in HPI  Physical Exam Vital Signs  I have reviewed the triage vital signs BP 123/79   Pulse 69   Temp 98.9 F (37.2 C)   Resp 18   Ht 6' 1" (1.854 m)   Wt 89.8 kg   SpO2 99%   BMI 26.12 kg/m   Physical Exam Vitals reviewed.  Constitutional:      General: He is not in acute distress.    Appearance: He is well-developed. He is not diaphoretic.  HENT:     Head: Normocephalic and atraumatic.     Nose: Nose normal.  Eyes:     General: No scleral icterus.       Right eye: No discharge.        Left eye: No discharge.     Conjunctiva/sclera: Conjunctivae normal.     Pupils: Pupils are equal, round, and reactive to light.  Cardiovascular:     Rate and Rhythm: Normal rate and regular rhythm.     Heart sounds: No murmur heard.    No friction rub. No gallop.  Pulmonary:     Effort: Pulmonary effort is normal. No respiratory distress.     Breath sounds: Normal breath sounds. No stridor. No rales.  Abdominal:     General: There is no distension.     Palpations: Abdomen is soft.     Tenderness: There is no abdominal tenderness.  Musculoskeletal:        General: No tenderness.     Cervical back: Normal range of motion and neck supple.  Skin:    General: Skin is warm and dry.     Findings: No erythema or rash.  Neurological:     Mental Status: He is alert and oriented to person, place, and time.     ED Results and Treatments Labs (all labs ordered are listed, but only abnormal results are displayed) Labs Reviewed  BASIC METABOLIC PANEL - Abnormal; Notable for the following components:      Result Value   Sodium 132 (*)    Glucose, Bld 434 (*)    BUN 25 (*)    All other components within normal limits  URINALYSIS, ROUTINE W REFLEX MICROSCOPIC - Abnormal; Notable for the following components:    Color, Urine STRAW (*)  Specific Gravity, Urine 1.031 (*)    Glucose, UA >=500 (*)    Ketones, ur 20 (*)    All other components within normal limits  CBG MONITORING, ED - Abnormal; Notable for the following components:   Glucose-Capillary 406 (*)    All other components within normal limits  CBG MONITORING, ED - Abnormal; Notable for the following components:   Glucose-Capillary 308 (*)    All other components within normal limits  CBC  CBG MONITORING, ED                                                                                                                         EKG  EKG Interpretation  Date/Time:    Ventricular Rate:    PR Interval:    QRS Duration:   QT Interval:    QTC Calculation:   R Axis:     Text Interpretation:         Radiology No results found.  Pertinent labs & imaging results that were available during my care of the patient were reviewed by me and considered in my medical decision making (see MDM for details).  Medications Ordered in ED Medications - No data to display                                                                                                                                   Procedures Procedures  (including critical care time)  Medical Decision Making / ED Course    Complexity of Problem:  Co-morbidities/SDOH that complicate the patient evaluation/care: Noted above in HPI  Additional history obtained: Urgent care note from earlier in the day also stating that patient had A1c performed by [facility] felt it was 17%  Patient's presenting problem/concern, DDX, and MDM listed below: Hyperglycemia With polydipsia polyuria. We will obtain screening labs to assess for any electrolyte or metabolic derangements including DKA.  Hospitalization Considered:  Yes     Complexity of Data:   Laboratory Tests ordered listed below with my independent interpretation: CBC without leukocytosis or anemia Patient with  hyperglycemia but no evidence of DKA.  Pseudohyponatremia UA w/o infection     ED Course:    Assessment, Add'l Intervention, and Reassessment: Hyperglycemia Work-up is reassuring without evidence of DKA Blood sugar level is already trending down Patient is not requiring any additional intervention or admission to the hospital. States that he already has an  appointment with his PCP in July.     Final Clinical Impression(s) / ED Diagnoses Final diagnoses:  Hyperglycemia   The patient appears reasonably screened and/or stabilized for discharge and I doubt any other medical condition or other [facility] requiring further screening, evaluation, or treatment in the ED at this time prior to discharge. Safe for discharge with strict return precautions.  Disposition: Discharge  Condition: Good  I have discussed the results, Dx and Tx plan with the patient/family who expressed understanding and agree(s) with the plan. Discharge instructions discussed at length. The patient/family was given strict return precautions who verbalized understanding of the instructions. No further questions at time of discharge.    ED Discharge Orders     None        Follow Up: Center, [facility] [address] [place] [address] [phone]  Go to  as scheduled           This chart was dictated using voice recognition software.  Despite best efforts to proofread,  errors can occur which can change the documentation meaning.    Fatima Blank, MD 01/05/22 [phone]

## 2022-03-15 ENCOUNTER — Encounter (HOSPITAL_COMMUNITY): Payer: Self-pay | Admitting: Emergency Medicine

## 2022-03-15 ENCOUNTER — Ambulatory Visit (HOSPITAL_COMMUNITY)
Admission: EM | Admit: 2022-03-15 | Discharge: 2022-03-15 | Disposition: A | Discharge status: Home / Self Care | Payer: Medicaid Other | Attending: Internal Medicine | Admitting: Internal Medicine

## 2022-03-15 DIAGNOSIS — E1165 Type 2 diabetes mellitus with hyperglycemia: Secondary | ICD-10-CM | POA: Insufficient documentation

## 2022-03-15 DIAGNOSIS — R739 Hyperglycemia, unspecified: Secondary | ICD-10-CM | POA: Insufficient documentation

## 2022-03-15 LAB — COMPREHENSIVE METABOLIC PANEL
ALT: 35 U/L (ref 0–44)
AST: 31 U/L (ref 15–41)
Albumin: 3.4 g/dL — ABNORMAL LOW (ref 3.5–5.0)
Alkaline Phosphatase: 85 U/L (ref 38–126)
Anion gap: 9 (ref 5–15)
BUN: 17 mg/dL (ref 8–23)
CO2: 22 mmol/L (ref 22–32)
Calcium: 9 mg/dL (ref 8.9–10.3)
Chloride: 103 mmol/L (ref 98–111)
Creatinine, Ser: 0.89 mg/dL (ref 0.61–1.24)
GFR, Estimated: >60 mL/min (ref >60)
Glucose, Bld: 353 mg/dL — ABNORMAL HIGH (ref 70–99)
Potassium: 3.9 mmol/L (ref 3.5–5.1)
Sodium: 134 mmol/L — ABNORMAL LOW (ref 135–145)
Total Bilirubin: 0.9 mg/dL (ref 0.3–1.2)
Total Protein: 7 g/dL (ref 6.5–8.1)

## 2022-03-15 LAB — POCT URINALYSIS DIPSTICK, ED / UC
Bilirubin Urine: NEGATIVE
Glucose, UA: >=1000 mg/dL — AB
Hgb urine dipstick: NEGATIVE
Ketones, ur: 40 mg/dL — AB
Leukocytes,Ua: NEGATIVE
Nitrite: NEGATIVE
Protein, ur: NEGATIVE mg/dL
Specific Gravity, Urine: 1.015 (ref 1.005–1.030)
Urobilinogen, UA: 0.2 mg/dL (ref 0.0–1.0)
pH: 5 (ref 5.0–8.0)

## 2022-03-15 LAB — CBG MONITORING, ED: Glucose-Capillary: 382 mg/dL — ABNORMAL HIGH (ref 70–99)

## 2022-03-15 NOTE — ED Triage Notes (Signed)
Pt reports long time ago started having bladder heaviness and urinary frequency. Reports mouth is dry, has issues with blood sugars due to his goiter.

## 2022-03-15 NOTE — Discharge Instructions (Addendum)
I will call you if any blood work is abnormal. You will need to go to the hospital if something returns abnormal.  Please follow up with your primary care provider regarding diabetes management. I have placed a referral for diabetes education, someone should reach out to you about this.  Please go to the emergency department with any worsening symptoms.

## 2022-03-15 NOTE — ED Provider Notes (Signed)
[place]    CSN: [id number] Arrival date & time: 03/15/22  1102      History   Chief Complaint Chief Complaint  Patient presents with   Urinary Frequency    HPI Samuel [name] is a 66 y.o. male.  Presents with multiple year history of bladder heaviness and urinary frequency. Patient has type 2 diabetes for which he sporadically takes metformin.  He does not check his sugars at home.  He took his metformin this morning but not yesterday.  Today his only complaint is the bladder heaviness and urine frequency.  He denies any abdominal pain, nausea, vomiting.  No weakness, dizziness, shortness of breath, chest pain. He is A&O  On chart review he has history of noncompliance with medication use, frequent hyper glycemia. Last ED visit was 2 months ago, glucose in the 400s but no other signs or symptoms of DKA.  Past Medical History:  Diagnosis Date   Arthritis    Knees   Diabetes mellitus without complication ([place])    Goiter    Multinodular Goiter with partial thyroidectomey in [place]  1998.  Ultrasound confirming diagnosis in 01/17/2014    Patient Active Problem List   Diagnosis Date Noted   Airway obstruction 09/17/2019   COVID-19 virus infection 11/13/2018   Acute on chronic respiratory failure with hypoxia ([place]) 11/13/2018   Type 2 diabetes mellitus ([place]) 11/13/2018   Unspecified vitamin D deficiency 03/20/2014   Dental cavities 03/20/2014   IFG (impaired fasting glucose) 02/26/2014   Multinodular goiter 02/26/2014   Goiter     Past Surgical History:  Procedure Laterality Date   APPENDECTOMY  2014   THYROID SURGERY  1998   Enlarged and painful thyroid   THYROID SURGERY  1990       Home Medications    Prior to Admission medications   Medication Sig Start Date End Date Taking? Authorizing Provider  amoxicillin-clavulanate (AUGMENTIN) 875-125 MG tablet Take 1 tablet by mouth 2 (two) times daily. 08/12/21   Lajean Saver, MD  atorvastatin (LIPITOR)  20 MG tablet Take 20 mg by mouth daily. 03/30/21   [provider]  blood glucose meter kit and supplies KIT Check sugars twice daily, in morning when fasting and in evening before dinner Patient not taking: Reported on 09/30/2020 02/08/16   Mack Hook, MD  ciprofloxacin-hydrocortisone (CIPRO [facility]) OTIC suspension Place 3 drops into the right ear 2 (two) times daily. For five days 08/12/21   Lajean Saver, MD  JANUVIA 100 MG tablet Take 50 mg by mouth daily. 02/11/21   [provider]  JARDIANCE 25 MG TABS tablet Take 1 tablet (25 mg total) by mouth daily. 05/25/21   Volney American, PA-C  metFORMIN (GLUCOPHAGE) 1000 MG tablet Take 1,000 mg by mouth 2 (two) times daily. 02/11/21   [provider]  metFORMIN (GLUCOPHAGE) 1000 MG tablet Take 1 tablet (1,000 mg total) by mouth 2 (two) times daily. 05/25/21   Volney American, PA-C  metFORMIN (GLUCOPHAGE) 500 MG tablet Take 1 tablet (500 mg total) by mouth 2 (two) times daily. 09/30/20 02/23/21  Carlisle Cater, PA-C  metFORMIN (GLUCOPHAGE-XR) 500 MG 24 hr tablet Take 500 mg by mouth daily. 11/02/20   [provider]  nystatin (MYCOSTATIN) 100000 UNIT/ML suspension Take by mouth. 03/24/21   [provider]  sitaGLIPtin (JANUVIA) 50 MG tablet Take 1 tablet (50 mg total) by mouth daily. 05/25/21   Volney American, PA-C  albuterol (VENTOLIN HFA) 108 (90 Base) MCG/ACT inhaler  Inhale 2 puffs into the lungs every 6 (six) hours as needed for wheezing or shortness of breath. Patient not taking: Reported on 09/30/2020 08/10/20 09/30/20  Providence Lanius A, PA-C  pantoprazole (PROTONIX) 20 MG tablet Take 1 tablet (20 mg total) by mouth daily. Patient not taking: Reported on 09/30/2020 08/10/20 09/30/20  Volanda Napoleon, PA-C    Family History Family History  Problem Relation Age of Onset   Arthritis Sister    Arthritis Brother    Prostate cancer Brother     Social History Social History   Tobacco Use    Smoking status: Never   Smokeless tobacco: Never  Vaping Use   Vaping Use: Never used  Substance Use Topics   Alcohol use: No    Alcohol/week: 0.0 standard drinks of alcohol   Drug use: No     Allergies   Patient has no known allergies.   Review of Systems Review of Systems  Genitourinary:  Positive for frequency.   Per HPI  Physical Exam Triage Vital Signs ED Triage Vitals  Enc Vitals Group     BP 03/15/22 1153 116/78     Pulse Rate 03/15/22 1153 84     Resp 03/15/22 1153 18     Temp 03/15/22 1153 98.2 F (36.8 C)     Temp Source 03/15/22 1153 Oral     SpO2 03/15/22 1153 98 %     Weight --      Height --      Head Circumference --      Peak Flow --      Pain Score 03/15/22 1151 0     Pain Loc --      Pain Edu? --      Excl. in [place]? --    No data found.  Updated Vital Signs BP 116/78 (BP Location: Right Arm)   Pulse 84   Temp 98.2 F (36.8 C) (Oral)   Resp 18   SpO2 98%   Physical Exam Vitals and nursing note reviewed.  Constitutional:      General: He is not in acute distress.    Appearance: He is well-developed. He is not ill-appearing, toxic-appearing or diaphoretic.  HENT:     Mouth/Throat:     Mouth: Mucous membranes are moist.     Pharynx: Oropharynx is clear. No posterior oropharyngeal erythema.  Eyes:     Conjunctiva/sclera: Conjunctivae normal.     Pupils: Pupils are equal, round, and reactive to light.  Cardiovascular:     Rate and Rhythm: Normal rate and regular rhythm.     Pulses: Normal pulses.     Heart sounds: Normal heart sounds.  Pulmonary:     Effort: Pulmonary effort is normal. No respiratory distress.     Breath sounds: Normal breath sounds. No wheezing, rhonchi or rales.  Abdominal:     General: Bowel sounds are normal.     Palpations: Abdomen is soft.     Tenderness: There is no abdominal tenderness. There is no guarding or rebound.  Musculoskeletal:     Cervical back: Normal range of motion.  Lymphadenopathy:      Cervical: No cervical adenopathy.  Skin:    General: Skin is warm and dry.  Neurological:     General: No focal deficit present.     Mental Status: He is alert and oriented to person, place, and time.     Comments: Strength 5/5 all extrem      UC Treatments / Results  Labs (all  labs ordered are listed, but only abnormal results are displayed) Labs Reviewed  COMPREHENSIVE METABOLIC PANEL - Abnormal; Notable for the following components:      Result Value   Sodium 134 (*)    Glucose, Bld 353 (*)    Albumin 3.4 (*)    All other components within normal limits  CBG MONITORING, ED - Abnormal; Notable for the following components:   Glucose-Capillary 382 (*)    All other components within normal limits  POCT URINALYSIS DIPSTICK, ED / UC - Abnormal; Notable for the following components:   Glucose, UA >=1000 (*)    Ketones, ur 40 (*)    All other components within normal limits    EKG   Radiology No results found.  Procedures Procedures (including critical care time)  Medications Ordered in UC Medications - No data to display  Initial Impression / Assessment and Plan / UC Course  I have reviewed the triage vital signs and the nursing notes.  Pertinent labs & imaging results that were available during my care of the patient were reviewed by me and considered in my medical decision making (see chart for details).  Patient is alert and oriented, no acute distress.  No overt signs of DKA at this time. Glucose 382.  40 ketones in the urine without signs of infection. BMP 2 months ago had sodium 132, glucose 434, normal creatinine.  1:00PM: At this time stat lab CMP is pending. Patient had to leave to pick up his children. Will call patient if abnormal lab result returns, he understands he will need to go to the hospital for evaluation if this occurs.  Additionally verbalizes understanding that any worsening symptoms he should go right to the ED.  Placed referral for diabetes  education.  Also recommend following up with primary care provider regarding management  2:12 PM: sodium 134, glucose 353, normal creatinine. Really unchanged from 2 months ago. He will benefit from follow up with PCP and diabetes management.  Final Clinical Impressions(s) / UC Diagnoses   Final diagnoses:  Hyperglycemia  Uncontrolled type 2 diabetes mellitus with hyperglycemia [facility])     Discharge Instructions      I will call you if any blood work is abnormal. You will need to go to the hospital if something returns abnormal.  Please follow up with your primary care provider regarding diabetes management. I have placed a referral for diabetes education, someone should reach out to you about this.  Please go to the emergency department with any worsening symptoms.    ED Prescriptions   None    PDMP not reviewed this encounter.   Kyra Leyland 03/15/22 1415

## 2022-05-09 ENCOUNTER — Encounter (HOSPITAL_COMMUNITY): Payer: Self-pay

## 2022-05-09 ENCOUNTER — Emergency Department (HOSPITAL_COMMUNITY)
Admission: EM | Admit: 2022-05-09 | Discharge: 2022-05-09 | Disposition: A | Discharge status: Home / Self Care | Payer: Medicaid Other | Attending: Emergency Medicine | Admitting: Emergency Medicine

## 2022-05-09 ENCOUNTER — Other Ambulatory Visit: Payer: Self-pay

## 2022-05-09 DIAGNOSIS — K0889 Other specified disorders of teeth and supporting structures: Secondary | ICD-10-CM | POA: Diagnosis present

## 2022-05-09 MED ORDER — PENICILLIN V POTASSIUM 500 MG PO TABS: 500.0000 mg | ORAL_TABLET | Freq: Three times a day (TID) | ORAL | 0 refills | Status: AC

## 2022-05-09 MED ORDER — LIDOCAINE VISCOUS HCL 2 % MT SOLN: 15.0000 mL | OROMUCOSAL | 0 refills | Status: DC | PRN

## 2022-05-09 NOTE — Discharge Instructions (Addendum)
You have multiple broken teeth in your mouth.  One of them is starting to get infected.  I have sent antibiotics and some lidocaine solution to your pharmacy.  The lidocaine solution used to numb.  You should also using ibuprofen and Tylenol for any pain.  The antibiotic may upset your stomach so please take it with food.  There are some resources for dentists to try and get in contact with for your broken teeth and cavities. Return with any worsening symptoms. It was a pleasure to meet you and I hope you feel better!

## 2022-05-09 NOTE — ED Provider Notes (Signed)
[place] EMERGENCY DEPARTMENT Provider Note   CSN: [id number] Arrival date & time: 05/09/22  1105     History No chief complaint on file.   Samuel Arnall is a 66 y.o. male with a past medical history of type 2 diabetes presenting today with right lower dental pain.  Reports has been going on for couple of days.  Has never seen a dentist.  Says that he is having broken teeth that are rubbing up against his tongue and causing pain.  HPI     Home Medications Prior to Admission medications   Medication Sig Start Date End Date Taking? Authorizing Provider  lidocaine (XYLOCAINE) 2 % solution Use as directed 15 mLs in the mouth or throat as needed for mouth pain. 05/09/22  Yes Brennan Litzinger A, PA-C  penicillin v potassium (VEETID) 500 MG tablet Take 1 tablet (500 mg total) by mouth 3 (three) times daily for 7 days. 05/09/22 05/16/22 Yes Talulah Schirmer A, PA-C  amoxicillin-clavulanate (AUGMENTIN) 875-125 MG tablet Take 1 tablet by mouth 2 (two) times daily. 08/12/21   Lajean Saver, MD  atorvastatin (LIPITOR) 20 MG tablet Take 20 mg by mouth daily. 03/30/21   [provider]  blood glucose meter kit and supplies KIT Check sugars twice daily, in morning when fasting and in evening before dinner Patient not taking: Reported on 09/30/2020 02/08/16   Mack Hook, MD  ciprofloxacin-hydrocortisone (CIPRO [facility]) OTIC suspension Place 3 drops into the right ear 2 (two) times daily. For five days 08/12/21   Lajean Saver, MD  JANUVIA 100 MG tablet Take 50 mg by mouth daily. 02/11/21   [provider]  JARDIANCE 25 MG TABS tablet Take 1 tablet (25 mg total) by mouth daily. 05/25/21   Volney American, PA-C  metFORMIN (GLUCOPHAGE) 1000 MG tablet Take 1,000 mg by mouth 2 (two) times daily. 02/11/21   [provider]  metFORMIN (GLUCOPHAGE) 1000 MG tablet Take 1 tablet (1,000 mg total) by mouth 2 (two) times daily. 05/25/21   Volney American, PA-C   metFORMIN (GLUCOPHAGE) 500 MG tablet Take 1 tablet (500 mg total) by mouth 2 (two) times daily. 09/30/20 02/23/21  Carlisle Cater, PA-C  metFORMIN (GLUCOPHAGE-XR) 500 MG 24 hr tablet Take 500 mg by mouth daily. 11/02/20   [provider]  nystatin (MYCOSTATIN) 100000 UNIT/ML suspension Take by mouth. 03/24/21   [provider]  sitaGLIPtin (JANUVIA) 50 MG tablet Take 1 tablet (50 mg total) by mouth daily. 05/25/21   Volney American, PA-C  albuterol (VENTOLIN HFA) 108 (90 Base) MCG/ACT inhaler Inhale 2 puffs into the lungs every 6 (six) hours as needed for wheezing or shortness of breath. Patient not taking: Reported on 09/30/2020 08/10/20 09/30/20  Providence Lanius A, PA-C  pantoprazole (PROTONIX) 20 MG tablet Take 1 tablet (20 mg total) by mouth daily. Patient not taking: Reported on 09/30/2020 08/10/20 09/30/20  Volanda Napoleon, PA-C      Allergies    Patient has no known allergies.    Review of Systems   Review of Systems  Physical Exam Updated Vital Signs BP 113/80 (BP Location: Left Arm)   Pulse 70   Temp 98.6 F (37 C) (Oral)   Resp 18   SpO2 97%  Physical Exam Vitals and nursing note reviewed.  Constitutional:      Appearance: Normal appearance.  HENT:     Head: Normocephalic and atraumatic.     Mouth/Throat:     Comments: Teeth in very  poor dentition.  Multiple fractured teeth.  No signs of abscess.  Airway clear and tolerating secretions.  No PTA/RPA.  No signs of Ludwig angina Eyes:     General: No scleral icterus.    Conjunctiva/sclera: Conjunctivae normal.  Pulmonary:     Effort: Pulmonary effort is normal. No respiratory distress.  Skin:    Findings: No rash.  Neurological:     Mental Status: He is alert.  Psychiatric:        Mood and Affect: Mood normal.     ED Results / Procedures / Treatments   Labs (all labs ordered are listed, but only abnormal results are displayed) Labs Reviewed - No data to display  EKG None  Radiology No results  found.  Procedures Procedures   Medications Ordered in ED Medications - No data to display  ED Course/ Medical Decision Making/ A&P                           Medical Decision Making Risk Prescription drug management.   66 year old male presenting with dental pain.  Physical exam benign, no signs of Ludwick Angela, tolerating secretions and airway clear.  No signs of abscess.  Does have some signs of caries and infection.  We will treat him with penicillin and lidocaine solution for his pain.  Encouraged NSAID and Tylenol use.  Ultimately needs to see a dentist and resources were given.  Patient agreeable to discharge from triage instead of waiting for further evaluation in the back of the department.   Final Clinical Impression(s) / ED Diagnoses Final diagnoses:  Pain, dental    Rx / DC Orders ED Discharge Orders          Ordered    lidocaine (XYLOCAINE) 2 % solution  As needed        05/09/22 1525    penicillin v potassium (VEETID) 500 MG tablet  3 times daily        05/09/22 1525           Results and diagnoses were explained to the patient. Return precautions discussed in full. Patient had no additional questions and expressed complete understanding.   This chart was dictated using voice recognition software.  Despite best efforts to proofread,  errors can occur which can change the documentation meaning.    Darliss Ridgel 05/09/22 1530    Godfrey Pick, MD 05/11/22 [phone]

## 2022-05-09 NOTE — ED Provider Triage Note (Signed)
Emergency Medicine Provider Triage Evaluation Note  Samuel [name] , a 66 y.o. male  was evaluated in triage.  Pt complains of right lower molar dental pain.  States that 2 days ago his molar broke and he has been having pain and a numbing sensation ever since.  States that it is interrupting his ability to eat.  States that he feels like people are going "bad magic on him."  Does not have a dentist.  No fevers..  Review of Systems  Positive: See above Negative:   Physical Exam  BP 110/74 (BP Location: Left Arm)   Pulse 81   Temp 98.6 F (37 C) (Oral)   Resp 18   SpO2 97%  Gen:   Awake, no distress   Resp:  Normal effort  MSK:   Moves extremities without difficulty  Other:  Right lower molar is fractured.  No periapical abscess.  Poor dentition. Medical Decision Making  Medically screening exam initiated at 12:05 PM.  Appropriate orders placed.  Samuel [name] was informed that the remainder of the evaluation will be completed by another provider, this initial triage assessment does not replace that evaluation, and the importance of remaining in the ED until their evaluation is complete.     Samuel Hillier, PA-C 05/09/22 1206

## 2022-05-09 NOTE — ED Triage Notes (Signed)
Patient complains of right sided broken tooth for 3 days, states that it is rubbing the side of his mouth. Pain to eat

## 2022-07-16 ENCOUNTER — Emergency Department (HOSPITAL_COMMUNITY)
Admission: EM | Admit: 2022-07-16 | Discharge: 2022-07-16 | Discharge status: Against Medical Advice | Payer: Medicaid Other | Attending: Emergency Medicine | Admitting: Emergency Medicine

## 2022-07-16 ENCOUNTER — Other Ambulatory Visit: Payer: Self-pay

## 2022-07-16 ENCOUNTER — Encounter (HOSPITAL_COMMUNITY): Payer: Self-pay

## 2022-07-16 DIAGNOSIS — Z5321 Procedure and treatment not carried out due to patient leaving prior to being seen by health care provider: Secondary | ICD-10-CM | POA: Diagnosis not present

## 2022-07-16 DIAGNOSIS — L299 Pruritus, unspecified: Secondary | ICD-10-CM | POA: Diagnosis not present

## 2022-07-16 NOTE — ED Provider Triage Note (Signed)
Emergency Medicine Provider Triage Evaluation Note  Samuel [name] , a 66 y.o. male  was evaluated in triage.  Pt complains of pruritus onset yesterday. No new medications, lotion, soap, detergent, clothes, food. No abdominal pain, nausea, vomiting, fever. No meds tried at home.   Review of Systems  Positive:  Negative:   Physical Exam  BP 122/72   Pulse (!) 103   Temp 98.6 F (37 C) (Oral)   Resp 16   SpO2 95%  Gen:   Awake, no distress   Resp:  Normal effort  MSK:   Moves extremities without difficulty  Other:    Medical Decision Making  Medically screening exam initiated at 11:40 AM.  Appropriate orders placed.  Ernst Cumpston was informed that the remainder of the evaluation will be completed by another provider, this initial triage assessment does not replace that evaluation, and the importance of remaining in the ED until their evaluation is complete.    Elyssa Pendelton A, PA-C 07/16/22 1140

## 2022-07-16 NOTE — ED Notes (Signed)
Patient called x2 for room assignment with no response and not visible in lobby

## 2022-07-16 NOTE — ED Triage Notes (Signed)
Patient complains of generalized itching x 1 day, reports hx of same. No exposures and has not taken any otc meds

## 2022-09-17 LAB — AMB RESULTS CONSOLE CBG: Glucose: 522

## 2022-09-17 NOTE — Progress Notes (Signed)
Pt is not fasting. Pt is not taking medication for diabetes. Pt has spoke with Dr. Harriet Masson, Cardiologist about high blood sugar. Pleas follow up on need for new endocrinologist.

## 2022-09-26 ENCOUNTER — Other Ambulatory Visit: Payer: Self-pay

## 2022-09-26 ENCOUNTER — Emergency Department (HOSPITAL_COMMUNITY)
Admission: EM | Admit: 2022-09-26 | Discharge: 2022-09-26 | Disposition: A | Discharge status: Home / Self Care | Payer: Medicaid Other | Attending: Emergency Medicine | Admitting: Emergency Medicine

## 2022-09-26 ENCOUNTER — Encounter (HOSPITAL_COMMUNITY): Payer: Self-pay

## 2022-09-26 DIAGNOSIS — K029 Dental caries, unspecified: Secondary | ICD-10-CM | POA: Diagnosis not present

## 2022-09-26 DIAGNOSIS — M79662 Pain in left lower leg: Secondary | ICD-10-CM | POA: Diagnosis not present

## 2022-09-26 DIAGNOSIS — Z7984 Long term (current) use of oral hypoglycemic drugs: Secondary | ICD-10-CM | POA: Insufficient documentation

## 2022-09-26 DIAGNOSIS — E119 Type 2 diabetes mellitus without complications: Secondary | ICD-10-CM | POA: Diagnosis not present

## 2022-09-26 DIAGNOSIS — M79604 Pain in right leg: Secondary | ICD-10-CM

## 2022-09-26 DIAGNOSIS — M79661 Pain in right lower leg: Secondary | ICD-10-CM | POA: Diagnosis not present

## 2022-09-26 DIAGNOSIS — K0889 Other specified disorders of teeth and supporting structures: Secondary | ICD-10-CM | POA: Diagnosis present

## 2022-09-26 DIAGNOSIS — K1379 Other lesions of oral mucosa: Secondary | ICD-10-CM

## 2022-09-26 MED ORDER — ACETAMINOPHEN 325 MG PO TABS
650.0000 mg | ORAL_TABLET | Freq: Once | ORAL | Status: AC
  Administered 2022-09-26: 650 mg via ORAL
  Filled 2022-09-26: qty 2

## 2022-09-26 MED ORDER — AMOXICILLIN-POT CLAVULANATE 875-125 MG PO TABS: 1.0000 | ORAL_TABLET | Freq: Two times a day (BID) | ORAL | 0 refills | Status: DC

## 2022-09-26 NOTE — ED Provider Notes (Signed)
[facility] [facility] Provider Note   CSN: [id number] Arrival date & time: 09/26/22  [id number]     History  Chief Complaint  Patient presents with   Dental Pain    Samuel [name] is a 67 y.o. male.  Patient with history of diabetes presents to the emergency department for evaluation of dental pain.  He has had pain on the left upper and right upper jaw.  He does report broken and sharp teeth which is irritating his tongue.  No ear pain or facial pain.  No facial swelling.  No fevers.  He also reports pain in his bilateral legs.  No associated swelling.  He states that his left thigh seems "thin".  Denies injuries.  States that he has had pain in his knees in the past that was treated, but cannot remember the name of the medication.       Home Medications Prior to Admission medications   Medication Sig Start Date End Date Taking? Authorizing Provider  amoxicillin-clavulanate (AUGMENTIN) 875-125 MG tablet Take 1 tablet by mouth 2 (two) times daily. 09/26/22   Carlisle Cater, PA-C  atorvastatin (LIPITOR) 20 MG tablet Take 20 mg by mouth daily. 03/30/21   [provider]  blood glucose meter kit and supplies KIT Check sugars twice daily, in morning when fasting and in evening before dinner Patient not taking: Reported on 09/30/2020 02/08/16   Mack Hook, MD  ciprofloxacin-hydrocortisone (CIPRO [facility]) OTIC suspension Place 3 drops into the right ear 2 (two) times daily. For five days 08/12/21   Lajean Saver, MD  JANUVIA 100 MG tablet Take 50 mg by mouth daily. 02/11/21   [provider]  JARDIANCE 25 MG TABS tablet Take 1 tablet (25 mg total) by mouth daily. 05/25/21   Volney American, PA-C  lidocaine (XYLOCAINE) 2 % solution Use as directed 15 mLs in the mouth or throat as needed for mouth pain. 05/09/22   Redwine, Madison A, PA-C  metFORMIN (GLUCOPHAGE) 1000 MG tablet Take 1,000 mg by mouth 2 (two) times daily. 02/11/21   [provider]  metFORMIN (GLUCOPHAGE) 1000 MG tablet Take 1 tablet (1,000 mg total) by mouth 2 (two) times daily. 05/25/21   Volney American, PA-C  metFORMIN (GLUCOPHAGE) 500 MG tablet Take 1 tablet (500 mg total) by mouth 2 (two) times daily. 09/30/20 02/23/21  Carlisle Cater, PA-C  metFORMIN (GLUCOPHAGE-XR) 500 MG 24 hr tablet Take 500 mg by mouth daily. 11/02/20   [provider]  nystatin (MYCOSTATIN) 100000 UNIT/ML suspension Take by mouth. 03/24/21   [provider]  sitaGLIPtin (JANUVIA) 50 MG tablet Take 1 tablet (50 mg total) by mouth daily. 05/25/21   Volney American, PA-C  albuterol (VENTOLIN HFA) 108 (90 Base) MCG/ACT inhaler Inhale 2 puffs into the lungs every 6 (six) hours as needed for wheezing or shortness of breath. Patient not taking: Reported on 09/30/2020 08/10/20 09/30/20  Providence Lanius A, PA-C  pantoprazole (PROTONIX) 20 MG tablet Take 1 tablet (20 mg total) by mouth daily. Patient not taking: Reported on 09/30/2020 08/10/20 09/30/20  Volanda Napoleon, PA-C      Allergies    Patient has no known allergies.    Review of Systems   Review of Systems  Physical Exam Updated Vital Signs BP 116/84   Pulse 88   Temp 98.6 F (37 C) (Oral)   Resp 17   SpO2 100%  Physical Exam Vitals and nursing note reviewed.  Constitutional:  Appearance: He is well-developed.  HENT:     Head: Normocephalic and atraumatic.     Jaw: No trismus.     Right Ear: Tympanic membrane, ear canal and external ear normal.     Left Ear: Tympanic membrane, ear canal and external ear normal.     Nose: Nose normal.     Mouth/Throat:     Dentition: Abnormal dentition. Dental caries present. No dental abscesses.     Pharynx: Uvula midline. No uvula swelling.     Tonsils: No tonsillar abscesses.     Comments: Patient with very poor dentition, several missing teeth, and others that contain large caries.  No obvious dental abscess. Eyes:     Pupils: Pupils are equal, round, and  reactive to light.  Neck:     Comments: No neck swelling or Lugwig's angina Musculoskeletal:     Cervical back: Normal range of motion and neck supple.     Comments: No significant tenderness to palpation of the bilateral lower extremities.  No swelling.  Knees with full range of motion without effusion.  Skin:    General: Skin is warm and dry.  Neurological:     Mental Status: He is alert.  Psychiatric:        Mood and Affect: Mood normal.     ED Results / Procedures / Treatments   Labs (all labs ordered are listed, but only abnormal results are displayed) Labs Reviewed - No data to display  EKG None  Radiology No results found.  Procedures Procedures    Medications Ordered in ED Medications  acetaminophen (TYLENOL) tablet 650 mg (has no administration in time range)    ED Course/ Medical Decision Making/ A&P    Patient seen and examined. History obtained directly from patient.   Labs/EKG: None ordered.  Imaging: None ordered.  Medications/Fluids: Ordered: Tylenol  Most recent vital signs reviewed and are as follows: BP 116/84   Pulse 88   Temp 98.6 F (37 C) (Oral)   Resp 17   SpO2 100%   Initial impression: dental pain/dental infection  Plan: Discharge to home.   Prescriptions written for: Augmentin, patient instructed to use only if he develops worsening pain in the face or facial swelling as this could indicate infection.  Other home care instructions discussed: Avoidance of chewing or other activities that makes the symptoms worse. Eat soft foods if needed and maintain good hydration.   ED return instructions discussed: Encouraged patient to return with worsening facial or neck swelling, difficulty breathing or swallowing, fever.   Follow-up instructions discussed: Patient encouraged to follow-up with provided dental referral, resources -- or their own dentist if able.                             Medical Decision Making Risk OTC  drugs. Prescription drug management.   Patient presents for dental pain. They do not have a fever and do not appear septic. Exam unconcerning for Ludwig's angina or other deep tissue infection in neck and I do not feel that advanced imaging is indicated at this time. Low suspicion for PTA, RPA, epiglottis based on exam.   Patient also complains of lower extremity pain without injury.  Full range of motion noted.  No signs of septic arthritis, bursitis.  It is possible that he could have a flare of osteoarthritis.  Low concern for DVT or arterial insufficiency given exam.  Patient will be treated for dental infection  with antibiotic. Encouraged tylenol/NSAIDs as prescribed or as directed on the packaging for pain. Encouraged follow-up with a dentist for definitive and long-term management.          Final Clinical Impression(s) / ED Diagnoses Final diagnoses:  Mouth pain  Pain in both lower extremities    Rx / DC Orders ED Discharge Orders          Ordered    amoxicillin-clavulanate (AUGMENTIN) 875-125 MG tablet  2 times daily        09/26/22 0956              Carlisle Cater, PA-C 09/26/22 1000    Isla Pence, MD 09/26/22 1535

## 2022-09-26 NOTE — Discharge Instructions (Signed)
Please read and follow all provided instructions.  Your diagnoses today include:  1. Mouth pain   2. Pain in both lower extremities     The exam and treatment you received today has been provided on an emergency basis only. This is not a substitute for complete medical or dental care.  Tests performed today include: Vital signs. See below for your results today.   Medications prescribed:  Augmentin - antibiotic  Fill this medication and take as prescribed if you develop worsening facial swelling or pain.  You have been prescribed an antibiotic medicine: take the entire course of medicine even if you are feeling better. Stopping early can cause the antibiotic not to work.  Take Tylenol over-the-counter as directed on packaging for generalized pain.  Take any prescribed medications only as directed.  Home care instructions:  Follow any educational materials contained in this packet.  Follow-up instructions: Please follow-up with your dentist for further evaluation of your symptoms.   Dental Assistance: See attached dental referral  Return instructions:  Please return to the Emergency Department if you experience worsening symptoms. Please return if you develop a fever, you develop more swelling in your face or neck, you have trouble breathing or swallowing food. Please return if you have any other emergent concerns.  Additional Information:  Your vital signs today were: BP 116/84   Pulse 88   Temp 98.6 F (37 C) (Oral)   Resp 17   SpO2 100%  If your blood pressure (BP) was elevated above 135/85 this visit, please have this repeated by your doctor within one month. --------------

## 2022-09-26 NOTE — ED Triage Notes (Addendum)
Pt came in via POV d/t dental issues on both upper sides. Pt endorses it feels sharp & denies pain. Lt upper side appears black, afebrile while in triage.

## 2022-09-28 ENCOUNTER — Encounter: Payer: Self-pay | Admitting: *Deleted

## 2022-09-28 NOTE — Progress Notes (Signed)
Pt seen at 09/17/22 screening event - blood sugar 522 and event RN noted dr notified. Pt established with [facility] PCP, Sheppard Evens, FNP, where  08/31/22 AIC > 15.0 - 09/21/22 OV note adds "He was recently seen by this provider on 08/31/22 for T2DM. At that time, he was A1C was found to be > 15.0% and he continues to deal with a multinodular goiter. He was advised to contact the Endocrinologist that he was previously referred to. Today, he states that he has contacted the Endocrinologist and has an appointment in May 2024 " No SDOH barriers noted at this event. No additional health equity team support indicated at this time.

## 2022-12-01 ENCOUNTER — Telehealth (HOSPITAL_COMMUNITY): Payer: Self-pay

## 2022-12-01 ENCOUNTER — Emergency Department (HOSPITAL_COMMUNITY)
Admission: EM | Admit: 2022-12-01 | Discharge: 2022-12-01 | Disposition: A | Discharge status: Home / Self Care | Payer: Medicaid Other | Attending: Emergency Medicine | Admitting: Emergency Medicine

## 2022-12-01 ENCOUNTER — Other Ambulatory Visit (HOSPITAL_COMMUNITY): Payer: Self-pay

## 2022-12-01 DIAGNOSIS — E119 Type 2 diabetes mellitus without complications: Secondary | ICD-10-CM | POA: Diagnosis not present

## 2022-12-01 DIAGNOSIS — L739 Follicular disorder, unspecified: Secondary | ICD-10-CM

## 2022-12-01 DIAGNOSIS — L662 Folliculitis decalvans: Secondary | ICD-10-CM | POA: Insufficient documentation

## 2022-12-01 DIAGNOSIS — Z7984 Long term (current) use of oral hypoglycemic drugs: Secondary | ICD-10-CM | POA: Diagnosis not present

## 2022-12-01 DIAGNOSIS — R3589 Other polyuria: Secondary | ICD-10-CM | POA: Diagnosis present

## 2022-12-01 LAB — URINALYSIS, ROUTINE W REFLEX MICROSCOPIC
Bilirubin Urine: NEGATIVE
Glucose, UA: >=500 mg/dL — AB
Hgb urine dipstick: NEGATIVE
Ketones, ur: 5 mg/dL — AB
Leukocytes,Ua: NEGATIVE
Nitrite: NEGATIVE
Protein, ur: NEGATIVE mg/dL
Specific Gravity, Urine: 1.031 — ABNORMAL HIGH (ref 1.005–1.030)
pH: 5 (ref 5.0–8.0)

## 2022-12-01 LAB — CBC
HCT: 43 % (ref 39.0–52.0)
Hemoglobin: 14.2 g/dL (ref 13.0–17.0)
MCH: 27.7 pg (ref 26.0–34.0)
MCHC: 33 g/dL (ref 30.0–36.0)
MCV: 84 fL (ref 80.0–100.0)
Platelets: 260 10*3/uL (ref 150–400)
RBC: 5.12 MIL/uL (ref 4.22–5.81)
RDW: 12.5 % (ref 11.5–15.5)
WBC: 4.8 10*3/uL (ref 4.0–10.5)
nRBC: 0 % (ref 0.0–0.2)

## 2022-12-01 LAB — BASIC METABOLIC PANEL
Anion gap: 11 (ref 5–15)
BUN: 15 mg/dL (ref 8–23)
CO2: 21 mmol/L — ABNORMAL LOW (ref 22–32)
Calcium: 9 mg/dL (ref 8.9–10.3)
Chloride: 99 mmol/L (ref 98–111)
Creatinine, Ser: 0.87 mg/dL (ref 0.61–1.24)
GFR, Estimated: >60 mL/min (ref >60)
Glucose, Bld: 610 mg/dL (ref 70–99)
Potassium: 4 mmol/L (ref 3.5–5.1)
Sodium: 131 mmol/L — ABNORMAL LOW (ref 135–145)

## 2022-12-01 LAB — CBG MONITORING, ED
Glucose-Capillary: 575 mg/dL (ref 70–99)
Glucose-Capillary: 375 mg/dL — ABNORMAL HIGH (ref 70–99)

## 2022-12-01 MED ORDER — SODIUM CHLORIDE 0.9 % IV BOLUS
500.0000 mL | Freq: Once | INTRAVENOUS | Status: AC
  Administered 2022-12-01: 500 mL via INTRAVENOUS

## 2022-12-01 MED ORDER — DOXYCYCLINE HYCLATE 100 MG PO CAPS: 100.0000 mg | ORAL_CAPSULE | Freq: Two times a day (BID) | ORAL | 0 refills | Status: DC

## 2022-12-01 MED ORDER — INSULIN ASPART 100 UNIT/ML IJ SOLN
10.0000 [IU] | Freq: Once | INTRAMUSCULAR | Status: AC
  Administered 2022-12-01: 10 [IU] via INTRAVENOUS

## 2022-12-01 MED ORDER — SODIUM CHLORIDE 0.9 % IV BOLUS
500.0000 mL | Freq: Once | INTRAVENOUS | Status: AC
Start: 2022-12-01 — End: 2022-12-01
  Administered 2022-12-01: 500 mL via INTRAVENOUS

## 2022-12-01 MED ORDER — SODIUM CHLORIDE 0.9 % IV BOLUS
1000.0000 mL | Freq: Once | INTRAVENOUS | Status: AC
  Administered 2022-12-01: 1000 mL via INTRAVENOUS

## 2022-12-01 NOTE — Telephone Encounter (Signed)
Pharmacy Patient Advocate Encounter  Insurance verification completed.    The patient is insured through [facility]   The patient is currently admitted and ran test claims for the following: basaglar, novolog, levemir.  Copays and coinsurance results were relayed to Inpatient clinical team.

## 2022-12-01 NOTE — Inpatient Diabetes Management (Signed)
Inpatient Diabetes Program Recommendations  AACE/ADA: New Consensus Statement on Inpatient Glycemic Control (2015)  Target Ranges:  Prepandial:   less than 140 mg/dL      Peak postprandial:   less than 180 mg/dL (1-2 hours)      Critically ill patients:  140 - 180 mg/dL   Lab Results  Component Value Date   GLUCAP 575 (HH) 12/01/2022   HGBA1C >15.5 (H) 02/23/2021    Review of Glycemic Control  Latest Reference Range & Units 12/01/22 11:40  CO2 22 - 32 mmol/L 21 (L)  Glucose 70 - 99 mg/dL 454 (HH)  Anion gap 5 - 15  11  (HH): Data is critically high (L): Data is abnormally low  Diabetes history: DM2 Outpatient Diabetes medications:  Metformin 500 mg BID Jardiance 25 mg QD-not taking Januvia 50 mg QD-Not taking Levemir 14 units QD-Not taking Trulicity-not taking Current orders for Inpatient glycemic control: IVF, Novolog 10 units x 1 & IVF  Spoke with patient at bedside.  He presents with bilateral hip and knee pain and dry mouth, thirst and increased urination.  He only Takes Metformin 500 mg BID at home.  He does not have a glucometer.  Reviewed chart-on 03/25/23 he was prescribed Levemir 10 units QD by his PCP.  He filled his Levemir but never took any because he was not taught how to administer.  He also states that "When my goiter acts up it messes everything up".   He has not had the Levemir in the refrigerator for several months.    Discussed A1C results with him and explained what an A1C is, basic pathophysiology of DM Type 2, basic home care, basic diabetes diet nutrition principles, importance of checking CBGs and maintaining good CBG control to prevent long-term and short-term complications. Reviewed signs and symptoms of hyperglycemia and hypoglycemia and how to treat hypoglycemia at home. Also reviewed blood sugar goals at home.  RNs to provide ongoing basic DM education at bedside with this patient. Will order educational booklet, insulin starter kit.    Assisted him  with downloading the [facility] video on insulin administration using the insulin pen.  Demonstrated the insulin pen.    Asked pharmacy for a benefit check.  Levemir and Novolog are both $4.    Will continue to follow.   Thank you, Dulce Sellar, MSN, CDCES Diabetes Coordinator Inpatient Diabetes Program [phone] (team pager from 8a-5p)

## 2022-12-01 NOTE — ED Triage Notes (Signed)
Pt states that he has been having chronic bilateral hip and knee pain flaring up recently. Pt also c/o polyuria and dysuria as well as feeling dehydrated and "my mouth is dry". Pt has hx of DM, states he is moderately compliant with PO medications. Pt denies abd pain, N/V/D.

## 2022-12-01 NOTE — TOC Benefit Eligibility Note (Signed)
Patient Product/process development scientist completed.    The patient is currently admitted and upon discharge could be taking Levemir.  The current 30 day co-pay is $4.00.   The patient is currently admitted and upon discharge could be taking Novolog.  The current 30 day co-pay is $4.00.   The patient is insured through [employer]   This test claim was processed through [facility]- copay amounts may vary at other pharmacies due to pharmacy/plan contracts, or as the patient moves through the different stages of their insurance plan.

## 2022-12-01 NOTE — Discharge Instructions (Addendum)
We evaluated you for your elevated blood sugar and your groin wound.  The bump on your groin is most likely folliculitis.  Please avoid shaving to prevent further infections.  Given the swelling we have started you on an antibiotic.  Please follow-up with urology for reassessment.  Please also follow-up with your primary doctor for wound check in the next 3 days.  Your blood sugar was very elevated in the emergency department.  It is extremely important to take your diabetes medications every day.  If you do not take your diabetes medications every day it can cause other problems such as coma, severe infection, or even death.  Please return to the emergency department if your area of swelling worsens, or you develop any new symptoms such as fevers or chills, nausea or vomiting, lightheadedness or dizziness, fainting, or any other concerning symptoms.

## 2022-12-01 NOTE — ED Provider Notes (Signed)
[place] EMERGENCY DEPARTMENT AT [facility] Provider Note  CSN: [id number] Arrival date & time: 12/01/22 1019  Chief Complaint(s) Dysuria  HPI Samuel [name] is a 67 y.o. male with history of DM presenting with polyuria. Reports he doesn't take his medications daily. Also complains of penile lesion. Denies dysuria. No fevers, chills. Reports polydipsia and increased thirst. Has some chronic hip and knee pain. 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  Ultrasound confirming diagnosis in 01/17/2014   Patient Active Problem List   Diagnosis Date Noted   Airway obstruction 09/17/2019   COVID-19 virus infection 11/13/2018   Acute on chronic respiratory failure with hypoxia (HCC) 11/13/2018   Type 2 diabetes mellitus (HCC) 11/13/2018   Unspecified vitamin D deficiency 03/20/2014   Dental cavities 03/20/2014   IFG (impaired fasting glucose) 02/26/2014   Multinodular goiter 02/26/2014   Goiter    Home Medication(s) Prior to Admission medications   Medication Sig Start Date End Date Taking? Authorizing Provider  doxycycline (VIBRAMYCIN) 100 MG capsule Take 1 capsule (100 mg total) by mouth 2 (two) times daily. 12/01/22  Yes Lonell Grandchild, MD  atorvastatin (LIPITOR) 20 MG tablet Take 20 mg by mouth daily. 03/30/21   [provider]  blood glucose meter kit and supplies KIT Check sugars twice daily, in morning when fasting and in evening before dinner Patient not taking: Reported on 09/30/2020 02/08/16   Julieanne Manson, MD  ciprofloxacin-hydrocortisone (CIPRO [facility]) OTIC suspension Place 3 drops into the right ear 2 (two) times daily. For five days 08/12/21   Cathren Laine, MD  JANUVIA 100 MG tablet Take 50 mg by mouth daily. 02/11/21   [provider]  JARDIANCE 25 MG TABS tablet Take  1 tablet (25 mg total) by mouth daily. 05/25/21   Particia Nearing, PA-C  lidocaine (XYLOCAINE) 2 % solution Use as directed 15 mLs in the mouth or throat as needed for mouth pain. 05/09/22   Redwine, Madison A, PA-C  metFORMIN (GLUCOPHAGE) 1000 MG tablet Take 1,000 mg by mouth 2 (two) times daily. 02/11/21   [provider]  metFORMIN (GLUCOPHAGE) 1000 MG tablet Take 1 tablet (1,000 mg total) by mouth 2 (two) times daily. 05/25/21   Particia Nearing, PA-C  metFORMIN (GLUCOPHAGE) 500 MG tablet Take 1 tablet (500 mg total) by mouth 2 (two) times daily. 09/30/20 02/23/21  Renne Crigler, PA-C  metFORMIN (GLUCOPHAGE-XR) 500 MG 24 hr tablet Take 500 mg by mouth daily. 11/02/20   [provider]  nystatin (MYCOSTATIN) 100000 UNIT/ML suspension Take by mouth. 03/24/21   [provider]  sitaGLIPtin (JANUVIA) 50 MG tablet Take 1 tablet (50 mg total) by mouth daily. 05/25/21   Particia Nearing, PA-C  albuterol (VENTOLIN HFA) 108 (90 Base) MCG/ACT inhaler Inhale 2 puffs into the lungs every 6 (six) hours as needed for wheezing or shortness of breath. Patient not taking: Reported on 09/30/2020 08/10/20 09/30/20  Graciella Freer A, PA-C  pantoprazole (PROTONIX) 20 MG tablet Take 1 tablet (20 mg total) by mouth daily. Patient not taking: Reported on 09/30/2020 08/10/20 09/30/20  Maxwell Caul, PA-C  Past Surgical History Past Surgical History:  Procedure Laterality Date   APPENDECTOMY  2014   THYROID SURGERY  1998   Enlarged and painful thyroid   THYROID SURGERY  1990   Family History Family History  Problem Relation Age of Onset   Arthritis Sister    Arthritis Brother    Prostate cancer Brother     Social History Social History   Tobacco Use   Smoking status: Never   Smokeless tobacco: Never  Vaping Use   Vaping Use: Never used   Substance Use Topics   Alcohol use: No    Alcohol/week: 0.0 standard drinks of alcohol   Drug use: No   Allergies Patient has no known allergies.  Review of Systems Review of Systems  Physical Exam Vital Signs  I have reviewed the triage vital signs BP 123/82   Pulse 77   Temp 98.1 F (36.7 C) (Oral)   Resp 18   SpO2 96%  Physical Exam Vitals and nursing note reviewed.  Constitutional:      General: He is not in acute distress.    Appearance: Normal appearance.  HENT:     Mouth/Throat:     Mouth: Mucous membranes are dry.  Eyes:     Conjunctiva/sclera: Conjunctivae normal.  Cardiovascular:     Rate and Rhythm: Normal rate and regular rhythm.  Pulmonary:     Effort: Pulmonary effort is normal. No respiratory distress.     Breath sounds: Normal breath sounds.  Abdominal:     General: Abdomen is flat.     Palpations: Abdomen is soft.     Tenderness: There is no abdominal tenderness.  Genitourinary:    Comments: Chaperoned by RN. Has shaved pubic hairs. Scattered folliculitis with one larger nodule with scant purulent drainage Musculoskeletal:     Right lower leg: No edema.     Left lower leg: No edema.     Comments: FROM of bilateral hips and knees are painless without focal tenderness  Skin:    General: Skin is warm and dry.     Capillary Refill: Capillary refill takes less than 2 seconds.  Neurological:     Mental Status: He is alert and oriented to person, place, and time. Mental status is at baseline.  Psychiatric:        Mood and Affect: Mood normal.        Behavior: Behavior normal.     ED Results and Treatments Labs (all labs ordered are listed, but only abnormal results are displayed) Labs Reviewed  BASIC METABOLIC PANEL - Abnormal; Notable for the following components:      Result Value   Sodium 131 (*)    CO2 21 (*)    Glucose, Bld 610 (*)    All other components within normal limits  URINALYSIS, ROUTINE W REFLEX MICROSCOPIC - Abnormal;  Notable for the following components:   Color, Urine STRAW (*)    Specific Gravity, Urine 1.031 (*)    Glucose, UA >=500 (*)    Ketones, ur 5 (*)    Bacteria, UA RARE (*)    All other components within normal limits  CBG MONITORING, ED - Abnormal; Notable for the following components:   Glucose-Capillary 575 (*)    All other components within normal limits  CBG MONITORING, ED - Abnormal; Notable for the following components:   Glucose-Capillary 375 (*)    All other components within normal limits  CBC  Radiology No results found.  Pertinent labs & imaging results that were available during my care of the patient were reviewed by me and considered in my medical decision making (see MDM for details).  Medications Ordered in ED Medications  insulin aspart (novoLOG) injection 10 Units (10 Units Intravenous Given 12/01/22 1314)  sodium chloride 0.9 % bolus 1,000 mL (0 mLs Intravenous Stopped 12/01/22 1404)  sodium chloride 0.9 % bolus 500 mL (0 mLs Intravenous Stopped 12/01/22 1627)  sodium chloride 0.9 % bolus 500 mL (0 mLs Intravenous Stopped 12/01/22 1627)                                                                                                                                     Procedures Procedures  (including critical care time)  Medical Decision Making / ED Course   MDM:  67 y/o presenting with polyuria and penile lesion.   On exam he has folliculitis likely due to shaving his pubic hair which he admits he does. One larger nodule is more tender, attempted needle aspiration without return of pus. Advised stop shaving hair and follow up with urology given location at base of shaft.   Also found to be very hyperglycemic without evidence of DKA. Patient appears dehydrated. Received insulin and fluids with improvement. Counseled patient on importance of  medication compliance and recommended re-check with PMD to ensure continued improvement      Lab Tests: -I ordered, reviewed, and interpreted labs.   The pertinent results include:   Labs Reviewed  BASIC METABOLIC PANEL - Abnormal; Notable for the following components:      Result Value   Sodium 131 (*)    CO2 21 (*)    Glucose, Bld 610 (*)    All other components within normal limits  URINALYSIS, ROUTINE W REFLEX MICROSCOPIC - Abnormal; Notable for the following components:   Color, Urine STRAW (*)    Specific Gravity, Urine 1.031 (*)    Glucose, UA >=500 (*)    Ketones, ur 5 (*)    Bacteria, UA RARE (*)    All other components within normal limits  CBG MONITORING, ED - Abnormal; Notable for the following components:   Glucose-Capillary 575 (*)    All other components within normal limits  CBG MONITORING, ED - Abnormal; Notable for the following components:   Glucose-Capillary 375 (*)    All other components within normal limits  CBC    Notable for significant hyperglycemia. No anion gap  Medicines ordered and prescription drug management: Meds ordered this encounter  Medications   insulin aspart (novoLOG) injection 10 Units   sodium chloride 0.9 % bolus 1,000 mL   sodium chloride 0.9 % bolus 500 mL   sodium chloride 0.9 % bolus 500 mL   doxycycline (VIBRAMYCIN) 100 MG capsule    Sig: Take 1 capsule (100 mg total) by mouth 2 (two) times daily.    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  Ultrasound confirming diagnosis in 01/17/2014      Dispostion: Disposition decision including need for hospitalization was considered, and patient discharged from emergency department.    Final Clinical Impression(s) / ED Diagnoses Final diagnoses:  Folliculitis     This chart was dictated using voice recognition software.  Despite best efforts to proofread,  errors can occur which can change the documentation meaning.    Lonell Grandchild, MD 12/02/22 [facility] Moment

## 2023-04-11 NOTE — Progress Notes (Signed)
Simple exam

## 2023-05-28 ENCOUNTER — Other Ambulatory Visit: Payer: Self-pay

## 2023-05-28 ENCOUNTER — Emergency Department (HOSPITAL_COMMUNITY)
Admission: EM | Admit: 2023-05-28 | Discharge: 2023-05-28 | Disposition: A | Discharge status: Home / Self Care | Payer: Medicaid Other | Attending: Emergency Medicine | Admitting: Emergency Medicine

## 2023-05-28 ENCOUNTER — Encounter (HOSPITAL_COMMUNITY): Payer: Self-pay

## 2023-05-28 DIAGNOSIS — M79604 Pain in right leg: Secondary | ICD-10-CM | POA: Insufficient documentation

## 2023-05-28 DIAGNOSIS — Z7984 Long term (current) use of oral hypoglycemic drugs: Secondary | ICD-10-CM | POA: Insufficient documentation

## 2023-05-28 DIAGNOSIS — E1140 Type 2 diabetes mellitus with diabetic neuropathy, unspecified: Secondary | ICD-10-CM | POA: Diagnosis not present

## 2023-05-28 DIAGNOSIS — E1165 Type 2 diabetes mellitus with hyperglycemia: Secondary | ICD-10-CM

## 2023-05-28 DIAGNOSIS — R5383 Other fatigue: Secondary | ICD-10-CM

## 2023-05-28 DIAGNOSIS — M79605 Pain in left leg: Secondary | ICD-10-CM | POA: Diagnosis not present

## 2023-05-28 DIAGNOSIS — E1142 Type 2 diabetes mellitus with diabetic polyneuropathy: Secondary | ICD-10-CM

## 2023-05-28 DIAGNOSIS — R531 Weakness: Secondary | ICD-10-CM | POA: Insufficient documentation

## 2023-05-28 LAB — COMPREHENSIVE METABOLIC PANEL
ALT: 19 U/L (ref 0–44)
AST: 20 U/L (ref 15–41)
Albumin: 3 g/dL — ABNORMAL LOW (ref 3.5–5.0)
Alkaline Phosphatase: 64 U/L (ref 38–126)
Anion gap: 10 (ref 5–15)
BUN: 23 mg/dL (ref 8–23)
CO2: 23 mmol/L (ref 22–32)
Calcium: 9.2 mg/dL (ref 8.9–10.3)
Chloride: 101 mmol/L (ref 98–111)
Creatinine, Ser: 0.74 mg/dL (ref 0.61–1.24)
GFR, Estimated: >60 mL/min (ref >60)
Glucose, Bld: 341 mg/dL — ABNORMAL HIGH (ref 70–99)
Potassium: 4 mmol/L (ref 3.5–5.1)
Sodium: 134 mmol/L — ABNORMAL LOW (ref 135–145)
Total Bilirubin: 0.8 mg/dL (ref 0.3–1.2)
Total Protein: 5.8 g/dL — ABNORMAL LOW (ref 6.5–8.1)

## 2023-05-28 LAB — CBC WITH DIFFERENTIAL/PLATELET
Abs Immature Granulocytes: 0.02 10*3/uL (ref 0.00–0.07)
Basophils Absolute: 0 10*3/uL (ref 0.0–0.1)
Basophils Relative: 0 %
Eosinophils Absolute: 0 10*3/uL (ref 0.0–0.5)
Eosinophils Relative: 1 %
HCT: 40.7 % (ref 39.0–52.0)
Hemoglobin: 13.6 g/dL (ref 13.0–17.0)
Immature Granulocytes: 0 %
Lymphocytes Relative: 33 %
Lymphs Abs: 2 10*3/uL (ref 0.7–4.0)
MCH: 27.5 pg (ref 26.0–34.0)
MCHC: 33.4 g/dL (ref 30.0–36.0)
MCV: 82.2 fL (ref 80.0–100.0)
Monocytes Absolute: 0.7 10*3/uL (ref 0.1–1.0)
Monocytes Relative: 11 %
Neutro Abs: 3.3 10*3/uL (ref 1.7–7.7)
Neutrophils Relative %: 55 %
Platelets: 244 10*3/uL (ref 150–400)
RBC: 4.95 MIL/uL (ref 4.22–5.81)
RDW: 12.8 % (ref 11.5–15.5)
WBC: 6.1 10*3/uL (ref 4.0–10.5)
nRBC: 0 % (ref 0.0–0.2)

## 2023-05-28 LAB — T4, FREE: Free T4: 0.88 ng/dL (ref 0.61–1.12)

## 2023-05-28 LAB — TSH: TSH: 0.648 u[IU]/mL (ref 0.350–4.500)

## 2023-05-28 LAB — CK: Total CK: 55 U/L (ref 49–397)

## 2023-05-28 LAB — CBG MONITORING, ED: Glucose-Capillary: 309 mg/dL — ABNORMAL HIGH (ref 70–99)

## 2023-05-28 MED ORDER — KETOROLAC TROMETHAMINE 30 MG/ML IJ SOLN
15.0000 mg | Freq: Once | INTRAMUSCULAR | Status: AC
  Administered 2023-05-28: 15 mg via INTRAVENOUS
  Filled 2023-05-28: qty 1

## 2023-05-28 MED ORDER — IBUPROFEN 600 MG PO TABS
600.0000 mg | ORAL_TABLET | Freq: Four times a day (QID) | ORAL | 0 refills | Status: DC | PRN
Start: 2023-05-28 — End: 2023-11-08

## 2023-05-28 MED ORDER — GABAPENTIN 100 MG PO CAPS: 100.0000 mg | ORAL_CAPSULE | Freq: Three times a day (TID) | ORAL | 1 refills | Status: DC

## 2023-05-28 MED ORDER — LACTATED RINGERS IV BOLUS
1000.0000 mL | Freq: Once | INTRAVENOUS | Status: AC
  Administered 2023-05-28: 1000 mL via INTRAVENOUS

## 2023-05-28 NOTE — ED Triage Notes (Signed)
Patient complains of at least 2 days per week of leg aching and fatigue for the past several weeks. Taking metformin daily as prescribed. Has been taking it for years. Alert and oriented. No fever, no chills

## 2023-05-28 NOTE — ED Provider Notes (Signed)
[place] EMERGENCY DEPARTMENT AT [facility] Provider Note   CSN: [id number] Arrival date & time: 05/28/23  1643     History  No chief complaint on file.   Samuel [name] is a 67 y.o. male who presents to the emergency department with chief complaint of generalized body pain and weakness.  History is given by the patient and his son who is at bedside.  I have reviewed the patient's previous medical records.  He has a past medical history of diabetes and multinodular goiter.  He reports having a surgery to his thyroid in [place] many years ago.  He reports episodes several times a week of severe fatigue and body pain everywhere.  He does have some pinching sensations in a stocking distribution of his lower extremities.  His son who is at bedside states that this happens several times a week and it is usually when his blood sugar is very high.  His son also states that he has been noncompliant with his medications.  He reports that he has not had his thyroid studies checked in a long time and that his primary care doctor gave him medication for his thyroid however he ran out and has not been taking any for several months.  Does have associated fatigue, gravelly voice, cold sensation.  He denies dysuria, muscle swelling, severe muscle pain, lightheadedness.  He is notably hypotensive on the monitor at 97 systolic  HPI     Home Medications Prior to Admission medications   Medication Sig Start Date End Date Taking? Authorizing Provider  atorvastatin (LIPITOR) 20 MG tablet Take 20 mg by mouth daily. 03/30/21   [provider]  blood glucose meter kit and supplies KIT Check sugars twice daily, in morning when fasting and in evening before dinner Patient not taking: Reported on 09/30/2020 02/08/16   Julieanne Manson, MD  ciprofloxacin-hydrocortisone (CIPRO [facility]) OTIC suspension Place 3 drops into the right ear 2 (two) times daily. For five days 08/12/21   Cathren Laine, MD  doxycycline  (VIBRAMYCIN) 100 MG capsule Take 1 capsule (100 mg total) by mouth 2 (two) times daily. 12/01/22   Lonell Grandchild, MD  JANUVIA 100 MG tablet Take 50 mg by mouth daily. 02/11/21   [provider]  JARDIANCE 25 MG TABS tablet Take 1 tablet (25 mg total) by mouth daily. 05/25/21   Particia Nearing, PA-C  lidocaine (XYLOCAINE) 2 % solution Use as directed 15 mLs in the mouth or throat as needed for mouth pain. 05/09/22   Redwine, Madison A, PA-C  metFORMIN (GLUCOPHAGE) 1000 MG tablet Take 1,000 mg by mouth 2 (two) times daily. 02/11/21   [provider]  metFORMIN (GLUCOPHAGE) 1000 MG tablet Take 1 tablet (1,000 mg total) by mouth 2 (two) times daily. 05/25/21   Particia Nearing, PA-C  metFORMIN (GLUCOPHAGE) 500 MG tablet Take 1 tablet (500 mg total) by mouth 2 (two) times daily. 09/30/20 02/23/21  Renne Crigler, PA-C  metFORMIN (GLUCOPHAGE-XR) 500 MG 24 hr tablet Take 500 mg by mouth daily. 11/02/20   [provider]  nystatin (MYCOSTATIN) 100000 UNIT/ML suspension Take by mouth. 03/24/21   [provider]  sitaGLIPtin (JANUVIA) 50 MG tablet Take 1 tablet (50 mg total) by mouth daily. 05/25/21   Particia Nearing, PA-C  albuterol (VENTOLIN HFA) 108 (90 Base) MCG/ACT inhaler Inhale 2 puffs into the lungs every 6 (six) hours as needed for wheezing or shortness of breath. Patient not taking: Reported on 09/30/2020 08/10/20 09/30/20  Graciella Freer A, PA-C  pantoprazole (PROTONIX) 20 MG tablet Take 1 tablet (20 mg total) by mouth daily. Patient not taking: Reported on 09/30/2020 08/10/20 09/30/20  Maxwell Caul, PA-C      Allergies    Patient has no known allergies.    Review of Systems   Review of Systems  Physical Exam Updated Vital Signs BP 112/69   Pulse 88   Temp 98.3 F (36.8 C)   Resp 18   SpO2 100%  Physical Exam Vitals and nursing note reviewed.  Constitutional:      General: He is not in acute distress.    Appearance: He is well-developed.  He is not diaphoretic.  HENT:     Head: Normocephalic and atraumatic.  Eyes:     General: No scleral icterus.    Conjunctiva/sclera: Conjunctivae normal.  Cardiovascular:     Rate and Rhythm: Normal rate and regular rhythm.     Pulses:          Dorsalis pedis pulses are 2+ on the right side and 2+ on the left side.       Posterior tibial pulses are 2+ on the right side and 2+ on the left side.     Heart sounds: Normal heart sounds.  Pulmonary:     Effort: Pulmonary effort is normal. No respiratory distress.     Breath sounds: Normal breath sounds.  Abdominal:     Palpations: Abdomen is soft.     Tenderness: There is no abdominal tenderness.  Musculoskeletal:     Cervical back: Normal range of motion and neck supple.  Skin:    General: Skin is warm and dry.  Neurological:     Mental Status: He is alert.  Psychiatric:        Behavior: Behavior normal.     ED Results / Procedures / Treatments   Labs (all labs ordered are listed, but only abnormal results are displayed) Labs Reviewed  COMPREHENSIVE METABOLIC PANEL  CBC WITH DIFFERENTIAL/PLATELET  CK  CBG MONITORING, ED    EKG None  Radiology No results found.  Procedures Procedures    Medications Ordered in ED Medications - No data to display  ED Course/ Medical Decision Making/ A&P Clinical Course as of 05/28/23 2043  Wynelle Link May 28, 2023  1802 Glucose(!): 341 [AH]  2030 T4, free [AH]  2030 TSH [AH]  2030 T3, free [AH]  2030 Comprehensive metabolic panel(!) [AH]  2030 CBC with Differential [AH]    Clinical Course User Index [AH] Arthor Captain, PA-C                                 Medical Decision Making Patient here with bilateral leg pain, fatigue.  His family reports that he has been very noncompliant with medications.  He did start taking his metformin yesterday.  Comorbidities also include multinodular goiter. Differential diagnosis includes dehydration from poorly controlled diabetes, peripheral  neuropathy.  No evidence of vascular compromise.  Differential also includes rhabdo myelitis or myositis, profound hypothyroidism.  Thyroid labs and CK reassuring.  After review of all data points I suspect patient is just having poorly controlled diabetes secondary to medication noncompliance and peripheral neuropathy.  Encouraged dietary modifications, close outpatient follow-up, medication compliance.  I have added gabapentin for neuropathic pain control.  History also gathered from patient and wife at bedside.  Will have the patient follow closely with PCP. Discussed outpatient follow-up and  return precautions.  Amount and/or Complexity of Data Reviewed Labs: ordered. Decision-making details documented in ED Course.    Details: Patient's labs reviewed.  Significant for hyper glycemia 309.  No evidence of anion gap or other significant abnormality. Discussion of management or test interpretation with external provider(s): I spent approximately 30 minutes discussing patient education including dietary modifications, medication compliance, medication side effects at bedside with the patient and his wife  Risk Prescription drug management.           Final Clinical Impression(s) / ED Diagnoses Final diagnoses:  Generalized weakness  Other fatigue  Poorly controlled diabetes mellitus (HCC)  Diabetic peripheral neuropathy associated with type 2 diabetes mellitus [facility])    Rx / DC Orders ED Discharge Orders     None         Arthor Captain, PA-C 06/01/23 1025    Benjiman Core, MD 06/05/23 1553

## 2023-05-28 NOTE — Discharge Instructions (Addendum)
I have contacted our family medicine service in order to have you seen in their clinic.  They will be contacting you to see if you are appropriate for clinic with them. Otherwise you need to call your nurse practitioner Tiffany to follow-up much sooner about your diabetes.    Contact a health care provider if: You have diabetes and have trouble keeping your blood sugar in the right range. Your blood sugar is at or above 240 mg/dL (78.2 mmol/L) for 2 days in a row. You have high blood sugar often. You have signs of illness, such as: Nausea or vomiting. A headache. A fever. You can't stop vomiting. Get help right away if: Your blood sugar monitor reads "high" even when you're taking insulin. You have trouble breathing. These symptoms may be an emergency. Call 911 right away. Do not wait to see if the symptoms will go away. Do not drive yourself to the hospital.

## 2023-05-30 LAB — T3, FREE: T3, Free: 2.6 pg/mL (ref 2.0–4.4)

## 2023-07-15 ENCOUNTER — Emergency Department (HOSPITAL_COMMUNITY)
Admission: EM | Admit: 2023-07-15 | Discharge: 2023-07-15 | Disposition: A | Discharge status: Home / Self Care | Payer: Medicaid Other | Attending: Emergency Medicine | Admitting: Emergency Medicine

## 2023-07-15 DIAGNOSIS — Z7984 Long term (current) use of oral hypoglycemic drugs: Secondary | ICD-10-CM | POA: Diagnosis not present

## 2023-07-15 DIAGNOSIS — M79605 Pain in left leg: Secondary | ICD-10-CM | POA: Insufficient documentation

## 2023-07-15 DIAGNOSIS — M79604 Pain in right leg: Secondary | ICD-10-CM | POA: Insufficient documentation

## 2023-07-15 DIAGNOSIS — E119 Type 2 diabetes mellitus without complications: Secondary | ICD-10-CM | POA: Diagnosis not present

## 2023-07-15 DIAGNOSIS — M25551 Pain in right hip: Secondary | ICD-10-CM | POA: Diagnosis present

## 2023-07-15 LAB — SEDIMENTATION RATE: Sed Rate: 11 mm/h (ref 0–16)

## 2023-07-15 LAB — BASIC METABOLIC PANEL
Anion gap: 8 (ref 5–15)
BUN: 14 mg/dL (ref 8–23)
CO2: 23 mmol/L (ref 22–32)
Calcium: 8.8 mg/dL — ABNORMAL LOW (ref 8.9–10.3)
Chloride: 103 mmol/L (ref 98–111)
Creatinine, Ser: 0.91 mg/dL (ref 0.61–1.24)
GFR, Estimated: >60 mL/min (ref >60)
Glucose, Bld: 365 mg/dL — ABNORMAL HIGH (ref 70–99)
Potassium: 3.7 mmol/L (ref 3.5–5.1)
Sodium: 134 mmol/L — ABNORMAL LOW (ref 135–145)

## 2023-07-15 LAB — CBC WITH DIFFERENTIAL/PLATELET
Abs Immature Granulocytes: 0.01 10*3/uL (ref 0.00–0.07)
Basophils Absolute: 0 10*3/uL (ref 0.0–0.1)
Basophils Relative: 1 %
Eosinophils Absolute: 0.1 10*3/uL (ref 0.0–0.5)
Eosinophils Relative: 2 %
HCT: 41.2 % (ref 39.0–52.0)
Hemoglobin: 13.7 g/dL (ref 13.0–17.0)
Immature Granulocytes: 0 %
Lymphocytes Relative: 36 %
Lymphs Abs: 1.5 10*3/uL (ref 0.7–4.0)
MCH: 27.6 pg (ref 26.0–34.0)
MCHC: 33.3 g/dL (ref 30.0–36.0)
MCV: 83.1 fL (ref 80.0–100.0)
Monocytes Absolute: 0.6 10*3/uL (ref 0.1–1.0)
Monocytes Relative: 13 %
Neutro Abs: 2 10*3/uL (ref 1.7–7.7)
Neutrophils Relative %: 48 %
Platelets: 255 10*3/uL (ref 150–400)
RBC: 4.96 MIL/uL (ref 4.22–5.81)
RDW: 12.8 % (ref 11.5–15.5)
WBC: 4.1 10*3/uL (ref 4.0–10.5)
nRBC: 0 % (ref 0.0–0.2)

## 2023-07-15 LAB — URINALYSIS, ROUTINE W REFLEX MICROSCOPIC
Bacteria, UA: NONE SEEN
Bilirubin Urine: NEGATIVE
Glucose, UA: >=500 mg/dL — AB
Hgb urine dipstick: NEGATIVE
Ketones, ur: NEGATIVE mg/dL
Leukocytes,Ua: NEGATIVE
Nitrite: NEGATIVE
Protein, ur: NEGATIVE mg/dL
Specific Gravity, Urine: 1.033 — ABNORMAL HIGH (ref 1.005–1.030)
pH: 6 (ref 5.0–8.0)

## 2023-07-15 LAB — C-REACTIVE PROTEIN: CRP: 0.6 mg/dL (ref <1.0)

## 2023-07-15 MED ORDER — CELECOXIB 200 MG PO CAPS: 200.0000 mg | ORAL_CAPSULE | Freq: Two times a day (BID) | ORAL | 0 refills | Status: DC

## 2023-07-15 NOTE — Discharge Instructions (Addendum)
You were seen today for bilateral leg pain.  This is most likely due to age and nerve pain.  Be sure to continue to take your diabetes medication as this will only worsen the pain down your legs.  I have prescribed Celebrex for you to take.  This will help out with the joint pain that you have been experiencing. Be sure to take the gabapentin that was prescribed to you at last ER visit on November 4.  This will help out with the nerve pain that you have been experiencing.  Follow-up with ENT for continued evaluation of your multinodular goiter on your neck.  Follow-up with PCP and schedule an appointment with them to further manage your diabetes and your neck pain.  Return to ER for worsening fatigue, shortness of breath, chest pain, weakness, numbness, tingling, loss of sensation, painful urination, painful bowel movements, blood in stool.

## 2023-07-15 NOTE — ED Provider Triage Note (Signed)
Emergency Medicine Provider Triage Evaluation Note  Samuel [name] , a 67 y.o. male  was evaluated in triage.  Pt complains of joint pain. Endorse pain to both hips, shoulders, knees and lower back since this AM.  Sts hips pain are new.  Knees pain and shoulders pain are chronic.  No fever, no dysuria, bowel/bladder incontinence or saddle anesthesia.  No sickness.  No trauma. Report urinary frequency  Review of Systems  Positive: As above Negative: As above  Physical Exam  BP 113/77 (BP Location: Right Arm)   Pulse 91   Temp 97.7 F (36.5 C) (Oral)   Resp 16   SpO2 100%  Gen:   Awake, no distress   Resp:  Normal effort  MSK:   Moves extremities without difficulty  Other:    Medical Decision Making  Medically screening exam initiated at 1:57 PM.  Appropriate orders placed.  Samuel [name] was informed that the remainder of the evaluation will be completed by another provider, this initial triage assessment does not replace that evaluation, and the importance of remaining in the ED until their evaluation is complete.     Fayrene Helper, PA-C 07/15/23 1404

## 2023-07-15 NOTE — ED Provider Notes (Signed)
[place] EMERGENCY DEPARTMENT AT [facility] Provider Note   CSN: [id number] Arrival date & time: 07/15/23  1334     History  Chief Complaint  Patient presents with   Leg Pain    Samuel [name] is a 67 y.o. male.  The history is provided by the patient.  Leg Pain Patient presents to the ED complaining of bilateral hip pain radiating down both sides of his legs after standing for long periods.  Prior history of goiter, type 2 diabetes.  He states that this pain has been present since 2009.  Had previously been given gabapentin which has helped with neuropathy.  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  He also is complaining of enlarged thyroid which he is being followed by ENT and has an appointment scheduled in January.  Denies tenderness, fatigue, weakness.     Home Medications Prior to Admission medications   Medication Sig Start Date End Date Taking? Authorizing Provider  celecoxib (CELEBREX) 200 MG capsule Take 1 capsule (200 mg total) by mouth 2 (two) times daily. 07/15/23  Yes Lunette Stands, PA-C  atorvastatin (LIPITOR) 20 MG tablet Take 20 mg by mouth daily. 03/30/21   [provider]  blood glucose meter kit and supplies KIT Check sugars twice daily, in morning when fasting and in evening before dinner Patient not taking: Reported on 09/30/2020 02/08/16   Julieanne Manson, MD  ciprofloxacin-hydrocortisone (CIPRO [facility]) OTIC suspension Place 3 drops into the right ear 2 (two) times daily. For five days 08/12/21   Cathren Laine, MD  doxycycline (VIBRAMYCIN) 100 MG capsule Take 1 capsule (100 mg total) by mouth 2 (two) times daily. 12/01/22   Lonell Grandchild, MD  gabapentin (NEURONTIN) 100 MG capsule Take 1 capsule (100 mg total) by mouth 3 (three) times daily. 05/28/23   Harris, Cammy Copa, PA-C  ibuprofen (ADVIL) 600 MG tablet Take 1 tablet (600 mg total) by mouth every 6 (six) hours as needed (for generalized pain).  05/28/23   Harris, Abigail, PA-C  JANUVIA 100 MG tablet Take 50 mg by mouth daily. 02/11/21   [provider]  JARDIANCE 25 MG TABS tablet Take 1 tablet (25 mg total) by mouth daily. 05/25/21   Particia Nearing, PA-C  lidocaine (XYLOCAINE) 2 % solution Use as directed 15 mLs in the mouth or throat as needed for mouth pain. 05/09/22   Redwine, Madison A, PA-C  metFORMIN (GLUCOPHAGE) 1000 MG tablet Take 1,000 mg by mouth 2 (two) times daily. 02/11/21   [provider]  metFORMIN (GLUCOPHAGE) 1000 MG tablet Take 1 tablet (1,000 mg total) by mouth 2 (two) times daily. 05/25/21   Particia Nearing, PA-C  metFORMIN (GLUCOPHAGE) 500 MG tablet Take 1 tablet (500 mg total) by mouth 2 (two) times daily. 09/30/20 02/23/21  Renne Crigler, PA-C  metFORMIN (GLUCOPHAGE-XR) 500 MG 24 hr tablet Take 500 mg by mouth daily. 11/02/20   [provider]  nystatin (MYCOSTATIN) 100000 UNIT/ML suspension Take by mouth. 03/24/21   [provider]  sitaGLIPtin (JANUVIA) 50 MG tablet Take 1 tablet (50 mg total) by mouth daily. 05/25/21   Particia Nearing, PA-C  albuterol (VENTOLIN HFA) 108 (90 Base) MCG/ACT inhaler Inhale 2 puffs into the lungs every 6 (six) hours as needed for wheezing or shortness of breath. Patient not taking: Reported on 09/30/2020 08/10/20 09/30/20  Graciella Freer A, PA-C  pantoprazole (PROTONIX) 20 MG tablet Take 1 tablet (20  mg total) by mouth daily. Patient not taking: Reported on 09/30/2020 08/10/20 09/30/20  Maxwell Caul, PA-C      Allergies    Patient has no known allergies.    Review of Systems   Review of Systems  Musculoskeletal:  Positive for myalgias.  All other systems reviewed and are negative.   Physical Exam Updated Vital Signs BP 113/75 (BP Location: Right Arm)   Pulse 88   Temp 98.2 F (36.8 C) (Oral)   Resp 14   SpO2 100%  Physical Exam Vitals and nursing note reviewed.  Constitutional:      Appearance: Normal appearance.  HENT:      Head: Normocephalic and atraumatic.  Eyes:     Extraocular Movements: Extraocular movements intact.     Conjunctiva/sclera: Conjunctivae normal.  Cardiovascular:     Rate and Rhythm: Normal rate and regular rhythm.     Pulses: Normal pulses.     Heart sounds: Normal heart sounds. No murmur heard.    No friction rub. No gallop.  Pulmonary:     Effort: Pulmonary effort is normal. No respiratory distress.     Breath sounds: Normal breath sounds.  Abdominal:     General: Abdomen is flat. There is no distension.     Palpations: Abdomen is soft.     Tenderness: There is no abdominal tenderness. There is no right CVA tenderness, left CVA tenderness or guarding.  Musculoskeletal:        General: No swelling, tenderness, deformity or signs of injury. Normal range of motion.     Cervical back: Normal range of motion. No rigidity or tenderness.     Right lower leg: No edema.     Left lower leg: No edema.     Comments: Negative straight leg test bilaterally  Lymphadenopathy:     Cervical: No cervical adenopathy.  Skin:    General: Skin is warm and dry.  Neurological:     General: No focal deficit present.     Mental Status: He is alert. Mental status is at baseline.     Sensory: No sensory deficit.     Motor: No weakness.     Coordination: Coordination normal.     Gait: Gait normal.  Psychiatric:        Mood and Affect: Mood normal.     ED Results / Procedures / Treatments   Labs (all labs ordered are listed, but only abnormal results are displayed) Labs Reviewed  BASIC METABOLIC PANEL - Abnormal; Notable for the following components:      Result Value   Sodium 134 (*)    Glucose, Bld 365 (*)    Calcium 8.8 (*)    All other components within normal limits  URINALYSIS, ROUTINE W REFLEX MICROSCOPIC - Abnormal; Notable for the following components:   Color, Urine STRAW (*)    Specific Gravity, Urine 1.033 (*)    Glucose, UA >=500 (*)    All other components within normal  limits  CBC WITH DIFFERENTIAL/PLATELET  SEDIMENTATION RATE  C-REACTIVE PROTEIN    EKG None  Radiology No results found.  Procedures Procedures    Medications Ordered in ED Medications - No data to display  ED Course/ Medical Decision Making/ A&P                                 Medical Decision Making  This patient is a 67 year old male who presents to  the ED for concern of bilateral leg pain.   Differential diagnoses prior to evaluation: Sciatica, spinal stenosis, tumor, fracture, neuropathy, muscle strain  Past Medical History / Social History / Additional history: Chart reviewed. Pertinent results include: Previously seen on 11/4 for same pain.  Prescribed gabapentin which she has not yet taken.  Also being followed by ENT for multinodular goiter  Medications / Treatment: Prescribed Celebrex for pain.  ED course:  Patient is a 67 year old male who presents to the ED for bilateral leg pain which has been chronic since 2009.  He states that pain is worse when laying on his side and when standing or walking for long periods.  Previously seen for the same pain but has not yet been taking medication.  Prior history of type 2 diabetes which he says he is taking medication for.  On exam endorses sensation to both bilateral extremities, no wounds noted, able to ambulate without difficulty.  States he is here because he is faster to see a doctor in the ER and then in outpatient.  Patient's pain most likely due to neuropathy however muscle strain.  Low suspicion for tumor, fracture, cauda equina due to the chronic nature of the pain without worsening.  Explained that patient should continue to take previously prescribed gabapentin, and take new Celebrex for pain and follow-up with primary care for further management of this.  Patient was also told to continue following up with ENT for multinodular goiter.  Patient expressed understanding and agreement with this plan.  Patient given  strict return ER precautions   Disposition: After consideration of the diagnostic results and the patients response to treatment, I feel that patient benefit from discharge and treatment noted as above.   emergency department workup does not suggest an emergent condition requiring admission or immediate intervention beyond what has been performed at this time. The plan is: Gabapentin, Celebrex, follow-up with PCP, follow-up with ENT, return to ER for new or worsening symptoms. The patient is safe for discharge and has been instructed to return immediately for worsening symptoms, change in symptoms or any other concerns.  Final Clinical Impression(s) / ED Diagnoses Final diagnoses:  Bilateral leg pain    Rx / DC Orders ED Discharge Orders          Ordered    celecoxib (CELEBREX) 200 MG capsule  2 times daily        07/15/23 1840              Lavonia Drafts 07/15/23 Loistine Chance, MD 07/16/23 [phone]

## 2023-07-15 NOTE — ED Triage Notes (Signed)
Pt c/o bilateral leg pain originating in hips and radiating to his feet for two days. Denies known injury. Pt states pain is worse when laying to sleep and when standing for long periods of time.

## 2023-11-08 ENCOUNTER — Ambulatory Visit: Payer: Medicaid Other | Attending: Cardiology | Admitting: Cardiology

## 2023-11-08 ENCOUNTER — Encounter: Payer: Self-pay | Admitting: Cardiology

## 2023-11-08 VITALS — BP 116/74 | HR 80 | Resp 16 | Ht 73.0 in | Wt 179.2 lb

## 2023-11-08 DIAGNOSIS — R072 Precordial pain: Secondary | ICD-10-CM

## 2023-11-08 DIAGNOSIS — E782 Mixed hyperlipidemia: Secondary | ICD-10-CM | POA: Diagnosis not present

## 2023-11-08 DIAGNOSIS — E1165 Type 2 diabetes mellitus with hyperglycemia: Secondary | ICD-10-CM | POA: Diagnosis not present

## 2023-11-08 NOTE — Patient Instructions (Addendum)
 Medication Instructions:  Your physician recommends that you continue on your current medications as directed. Please refer to the Current Medication list given to you today.  *If you need a refill on your cardiac medications before your next appointment, please call your pharmacy*  Lab Work: Please bring a copy of your labs from your primary care provider to your next visit.  If you have labs (blood work) drawn today and your tests are completely normal, you will receive your results only by: MyChart Message (if you have MyChart) OR A paper copy in the mail If you have any lab test that is abnormal or we need to change your treatment, we will call you to review the results.  Testing/Procedures: Your physician has requested that you have a coronary calcium score performed. This is not covered by insurance and will be an out-of-pocket cost of approximately $99.   Your physician has requested that you have en exercise stress myoview. For further information please visit [email or web address] Please follow instruction sheet, as given.  Your physician has requested that you have an echocardiogram. Echocardiography is a painless test that uses sound waves to create images of your heart. It provides your doctor with information about the size and shape of your heart and how well your heart's chambers and valves are working. This procedure takes approximately one hour. There are no restrictions for this procedure. Please do NOT wear cologne, perfume, aftershave, or lotions (deodorant is allowed). Please arrive 15 minutes prior to your appointment time.  Please note: We ask at that you not bring children with you during ultrasound (echo/ vascular) testing. Due to room size and safety concerns, children are not allowed in the ultrasound rooms during exams. Our front office staff cannot provide observation of children in our lobby area while testing is being conducted. An adult accompanying a patient to  their appointment will only be allowed in the ultrasound room at the discretion of the ultrasound technician under special circumstances. We apologize for any inconvenience.   Follow-Up: At [facility], you and your health needs are our priority.  As part of our continuing mission to provide you with exceptional heart care, we have created designated Provider Care Teams.  These Care Teams include your primary Cardiologist (physician) and Advanced Practice Providers (APPs -  Physician Assistants and Nurse Practitioners) who all work together to provide you with the care you need, when you need it.  Your next appointment:   3 month(s)  The format for your next appointment:   In Person  Provider:   Tessa Lerner, [facility]  Other Instructions **Please get your cholesterol rechecked with your primary care provider and have them manage any medication if necessary**  **Bring lab work results from your primary care office with you to your next visit**  Exercise/Lexiscan Myoview (Stress Test) Instructions  Please arrive 15 minutes prior to your appointment time for registration and insurance purposes.   The test will take approximately 3 to 4 hours to complete; you may bring reading material.  If someone comes with you to your appointment, they will need to remain in the main lobby due to limited space in the testing area. **If you are pregnant or breastfeeding, please notify the nuclear lab prior to your appointment**   How to prepare for your Myocardial Perfusion Test: Do not eat or drink 3 hours prior to your test, except you may have water. Do not consume products containing caffeine (regular or decaffeinated) 12 hours prior to your test. (  ex: coffee, chocolate, sodas, tea). Do bring a list of your current medications with you.  If not listed below, you may take your medications as normal. Do wear comfortable clothes (no dresses or overalls) and walking shoes, tennis shoes preferred (No heels or  open toe shoes are allowed). Do NOT wear cologne, perfume, aftershave, or lotions (deodorant is allowed). If these instructions are not followed, your test will have to be rescheduled.   Please report to [address], [address] for your test.  If you have questions or concerns about your appointment, you can call the Nuclear Lab at [phone].   If you cannot keep your appointment, please provide 24 hours notification to the Nuclear Lab, to avoid a possible $50 charge to your account. ---------------------------------------------------------------------------------------   1st Floor: - Lobby - Registration  - Pharmacy  - Lab - Cafe  2nd Floor: - PV Lab - Diagnostic Testing (echo, CT, nuclear med)  3rd Floor: - Vacant  4th Floor: - TCTS (cardiothoracic surgery) - AFib Clinic - Structural Heart Clinic - Vascular Surgery  - Vascular Ultrasound  5th Floor: - [facility] (general and EP) - Clinical Pharmacy for coumadin, hypertension, lipid, weight-loss medications, and med management appointments    Valet parking services will be available as well.

## 2023-11-08 NOTE — Progress Notes (Signed)
 Cardiology Office Note:    Date:  11/08/2023  NAME:  Samuel [name]    MRN: [id number] DOB:  [birth year]   PCP:  Luba Running, FNP  Former Cardiology Providers: None Primary Cardiologist:  Olinda Bertrand, DO, [facility] (established care 11/08/2023) Electrophysiologist:  None   Referring MD: Ave Bobo, MD  Reason of Consult: Chest Pain   Chief Complaint  Patient presents with   Chest Pain   New Patient (Initial Visit)    History of Present Illness:    Samuel [name] is a 68 y.o. African-American male whose past medical history and cardiovascular risk factors includes: Hyperlipidemia, non-insulin -dependent diabetes mellitus type 2,thyroid  disease s/p thyroid  surgery due to goiter, HLD, former smoker. He is being seen today for the evaluation of chest pain at the request of Ave Bobo, MD.  Precordial pain: Onset approximately 1 month ago. Intermittent. Describes a tightness like sensation. Occurs randomly. Duration: 5 minutes or less. Not brought on by effort related activities, does not resolve with rest.  No prior cardiovascular workup.  No family history of premature coronary disease or sudden cardiac death.  No structured exercise program or daily routine.  Accompanied by his son today.  Patient does speak [place] but is primary language is Arabic and translation services were utilized to make sure appropriate communication carried out during today's encounter.  Current Medications: Current Meds  Medication Sig   metFORMIN  (GLUCOPHAGE ) 500 MG tablet Take 1 tablet (500 mg total) by mouth 2 (two) times daily.     Allergies:    Patient has no known allergies.   Past Medical History: Past Medical History:  Diagnosis Date   Arthritis    Knees   Chest pain    Diabetes mellitus without complication (HCC)    Goiter    Multinodular Goiter with partial thyroidectomey in [place]  1998.  Ultrasound confirming diagnosis in 01/17/2014   Nausea     Past Surgical  History: Past Surgical History:  Procedure Laterality Date   APPENDECTOMY  2014   THYROID  SURGERY  1998   Enlarged and painful thyroid    THYROID  SURGERY  1990    Social History: Social History   Tobacco Use   Smoking status: Never   Smokeless tobacco: Never  Vaping Use   Vaping status: Never Used  Substance Use Topics   Alcohol use: No    Alcohol/week: 0.0 standard drinks of alcohol   Drug use: No    Family History: Family History  Problem Relation Age of Onset   Arthritis Sister    Arthritis Brother    Prostate cancer Brother     ROS:   Review of Systems  HENT:         Random and short lived epistasis   Cardiovascular:  Positive for chest pain (see HPI). Negative for claudication, irregular heartbeat, leg swelling, near-syncope, orthopnea, palpitations, paroxysmal nocturnal dyspnea and syncope.  Respiratory:  Negative for shortness of breath.   Hematologic/Lymphatic: Negative for bleeding problem.    EKGs/Labs/Other Studies Reviewed:   EKG: EKG Interpretation Date/Time:  Wednesday November 08 2023 09:57:29 EDT Ventricular Rate:  80 PR Interval:  200 QRS Duration:  70 QT Interval:  364 QTC Calculation: 419 R Axis:   19  Text Interpretation: Normal sinus rhythm Low voltage QRS Consider Anterolateral infarct , age undetermined When compared with ECG of 30-Sep-2020 13:59, No significant change since last tracing Confirmed by Olinda Bertrand [phone]) on 11/08/2023 10:08:27 AM  Echocardiogram: None    Labs:  Latest Ref Rng & Units 07/15/2023    2:11 PM 05/28/2023    5:16 PM 12/01/2022   11:40 AM  CBC  WBC 4.0 - 10.5 K/uL 4.1  6.1  4.8   Hemoglobin 13.0 - 17.0 g/dL 38.7  56.4  33.2   Hematocrit 39.0 - 52.0 % 41.2  40.7  43.0   Platelets 150 - 400 K/uL 255  244  260        Latest Ref Rng & Units 07/15/2023    2:11 PM 05/28/2023    5:16 PM 12/01/2022   11:40 AM  BMP  Glucose 70 - 99 mg/dL 951  884  166   BUN 8 - 23 mg/dL 14  23  15    Creatinine 0.61 - 1.24  mg/dL 0.63  0.16  0.10   Sodium 135 - 145 mmol/L 134  134  131   Potassium 3.5 - 5.1 mmol/L 3.7  4.0  4.0   Chloride 98 - 111 mmol/L 103  101  99   CO2 22 - 32 mmol/L 23  23  21    Calcium  8.9 - 10.3 mg/dL 8.8  9.2  9.0       Latest Ref Rng & Units 07/15/2023    2:11 PM 05/28/2023    5:16 PM 12/01/2022   11:40 AM  CMP  Glucose 70 - 99 mg/dL 932  355  732   BUN 8 - 23 mg/dL 14  23  15    Creatinine 0.61 - 1.24 mg/dL 2.02  5.42  7.06   Sodium 135 - 145 mmol/L 134  134  131   Potassium 3.5 - 5.1 mmol/L 3.7  4.0  4.0   Chloride 98 - 111 mmol/L 103  101  99   CO2 22 - 32 mmol/L 23  23  21    Calcium  8.9 - 10.3 mg/dL 8.8  9.2  9.0   Total Protein 6.5 - 8.1 g/dL  5.8    Total Bilirubin 0.3 - 1.2 mg/dL  0.8    Alkaline Phos 38 - 126 U/L  64    AST 15 - 41 U/L  20    ALT 0 - 44 U/L  19     (ABNORMAL) LIPID PANEL (10/10/2023 3:16 PM EDT) Results - (ABNORMAL) LIPID PANEL (10/10/2023 3:16 PM EDT) Component Value Ref Range Test Method Analysis Time Performed At Pathologist Signature  CHOLESTEROL, TOTAL 201 (H) 100 - 199 mg/dL     [facility]    TRIGLYCERIDES 113 0 - 149 mg/dL     [facility] [employer]    HDL CHOLESTEROL 47 >39 mg/dL     [facility]    VLDL CHOLESTEROL CAL 20 5 - 40 mg/dL     [facility]    LDL CHOL CALC (NIH) 134 (H) 0 - 99 mg/dL     [facility]       Recent Labs    05/28/23 1749  TSH 0.648    Physical Exam:    Today's Vitals   11/08/23 0953  BP: 116/74  Pulse: 80  Resp: 16  SpO2: 98%  Weight: 179 lb 3.2 oz (81.3 kg)  Height: 6\' 1"  (1.854 m)   Body mass index is 23.64 kg/m. Wt Readings from Last 3 Encounters:  11/08/23 179 lb 3.2 oz (81.3 kg)  01/04/22 198 lb (89.8 kg)  12/23/21 220 lb (99.8 kg)    Physical Exam  Constitutional:  Age appropriate, hemodynamically stable, no acute distress.   Neck: No JVD present.  Goiter  appreciated.  Cardiovascular: Normal rate, regular rhythm, S1 normal and S2 normal. Exam reveals no gallop  and no friction rub.  No murmur heard. Pulmonary/Chest: Breath sounds normal. He has no wheezes. He has no rales. He exhibits no tenderness.  Abdominal: Soft. Bowel sounds are normal. He exhibits no distension. There is no abdominal tenderness.  Musculoskeletal:        General: No tenderness or edema.  Neurological: He is alert and oriented to person, place, and time.  Skin: Skin is warm and dry.     Impression & Recommendation(s):  Impression:   ICD-10-CM   1. Precordial pain  R07.2 EKG 12-Lead    CT CARDIAC SCORING (SELF PAY ONLY)    ECHOCARDIOGRAM COMPLETE    MYOCARDIAL PERFUSION IMAGING    2. Type 2 diabetes mellitus with hyperglycemia, without long-term current use of insulin  (HCC)  E11.65     3. Mixed hyperlipidemia  E78.2        Recommendation(s):  Precordial pain Based on symptoms precordial pain is predominately noncardiac. However has multiple cardiovascular risk factors including diabetes and lipids which are not well-controlled based on limited data available.  The 10-year ASCVD risk score (Arnett DK, et al., 2019) is: 16.2%   Values used to calculate the score:     Age: 68 years     Sex: Male     Is Non-Hispanic African American: Yes     Diabetic: Yes     Tobacco smoker: No     Systolic Blood Pressure: 116 mmHg     Is BP treated: No     HDL Cholesterol: 47 mg/dL     Total Cholesterol: 201 mg/dL EKG is nonischemic. Echo will be ordered to evaluate for structural heart disease and left ventricular systolic function. Exercise nuclear stress test to evaluate for functional capacity and reversible ischemia. Coronary CTA was considered but not ordered given his history of thyroid  disease and known goiter. Reemphasized importance of improving his modifiable cardiovascular risk factors.  He needs to have better follow-up with PCP with addressing his other chronic comorbid conditions/cardiovascular risk factors.  Type 2 diabetes mellitus with hyperglycemia, without  long-term current use of insulin  (HCC) Currently on metformin . Most recent hemoglobin A1c is unknown. Consider SGLT2 inhibitors Recommended goal LDL <70  given his diabetes mellitus type 2.  Recommend that he follows up with PCP on reinitiating statin therapy  Mixed hyperlipidemia Most recent labs from March 2025 independently reviewed, LDL is 134 mg/dL Was on atorvastatin  in the past, no longer taking it Recommend a goal LDL <70 mg/dL. Will follow-up with PCP to initiate statin therapy  Orders Placed:  Orders Placed This Encounter  Procedures   CT CARDIAC SCORING (SELF PAY ONLY)    Standing Status:   Future    Expiration Date:   11/07/2024    [facility]   MYOCARDIAL PERFUSION IMAGING    Standing Status:   Future    Expiration Date:   11/07/2024    Patient weight in lbs:   179    Where should this be performed?:   [facility]    Type of stress:   Exercise   EKG 12-Lead   ECHOCARDIOGRAM COMPLETE    Standing Status:   Future    Expiration Date:   11/07/2024    Where should this test be performed:   [facility])    Does the patient weigh less than or  greater than 250 lbs?:   Patient weighs less than 250 lbs    Perflutren DEFINITY (image enhancing agent) should be administered unless hypersensitivity or allergy exist:   Administer Perflutren    Reason for exam-Echo:   Other-Full Diagnosis List    Full ICD-10/Reason for Exam:   Precordial pain [786.51.ICD-9-CM]     Final Medication List:   No orders of the defined types were placed in this encounter.   Medications Discontinued During This Encounter  Medication Reason   atorvastatin  (LIPITOR) 20 MG tablet Patient Preference   blood glucose meter kit and supplies KIT Patient Preference   celecoxib  (CELEBREX ) 200 MG capsule Patient Preference   ciprofloxacin -hydrocortisone (CIPRO  HC) OTIC suspension Patient Preference   doxycycline   (VIBRAMYCIN ) 100 MG capsule Patient Preference   gabapentin  (NEURONTIN ) 100 MG capsule Patient Preference   ibuprofen  (ADVIL ) 600 MG tablet Patient Preference   JANUVIA  100 MG tablet Patient Preference   JARDIANCE  25 MG TABS tablet Patient Preference   lidocaine  (XYLOCAINE ) 2 % solution Patient Preference   metFORMIN  (GLUCOPHAGE ) 1000 MG tablet Patient Preference   sitaGLIPtin  (JANUVIA ) 50 MG tablet Patient Preference   nystatin (MYCOSTATIN) 100000 UNIT/ML suspension Patient Preference   metFORMIN  (GLUCOPHAGE -XR) 500 MG 24 hr tablet Patient Preference   metFORMIN  (GLUCOPHAGE ) 1000 MG tablet Patient Preference     Current Outpatient Medications:    metFORMIN  (GLUCOPHAGE ) 500 MG tablet, Take 1 tablet (500 mg total) by mouth 2 (two) times daily., Disp: 60 tablet, Rfl: 0  Consent:   Informed Consent   Shared Decision Making/Informed Consent The risks [chest pain, shortness of breath, cardiac arrhythmias, dizziness, blood pressure fluctuations, myocardial infarction, stroke/transient ischemic attack, nausea, vomiting, allergic reaction, radiation exposure, metallic taste sensation and life-threatening complications (estimated to be 1 in 10,000)], benefits (risk stratification, diagnosing coronary artery disease, treatment guidance) and alternatives of a nuclear stress test were discussed in detail with Mr. Steelman and he agrees to proceed.     Disposition:   3 month follow up sooner if needed Patient may be asked to follow-up sooner based on the results of the above-mentioned testing.  His questions and concerns were addressed to his satisfaction. He voices understanding of the recommendations provided during this encounter.    Signed, Olinda Bertrand, DO, [facility]  [facility]  [address] [place], [place] [address] 11/08/2023 10:32 AM

## 2023-11-15 ENCOUNTER — Ambulatory Visit (HOSPITAL_COMMUNITY)
Admission: RE | Admit: 2023-11-15 | Discharge: 2023-11-15 | Disposition: A | Discharge status: Home / Self Care | Payer: Self-pay | Source: Ambulatory Visit | Attending: Cardiology | Admitting: Cardiology

## 2023-11-15 DIAGNOSIS — R072 Precordial pain: Secondary | ICD-10-CM | POA: Insufficient documentation

## 2023-11-16 ENCOUNTER — Other Ambulatory Visit: Payer: Self-pay

## 2023-11-16 DIAGNOSIS — E782 Mixed hyperlipidemia: Secondary | ICD-10-CM

## 2023-11-16 MED ORDER — ATORVASTATIN CALCIUM 20 MG PO TABS: 20.0000 mg | ORAL_TABLET | Freq: Every day | ORAL | 3 refills | Status: DC

## 2023-11-20 ENCOUNTER — Encounter: Payer: Self-pay | Admitting: Cardiology

## 2023-11-20 ENCOUNTER — Telehealth: Payer: Self-pay | Admitting: Cardiology

## 2023-11-20 NOTE — Telephone Encounter (Signed)
 Patient contacted 3x with no success, will send letter to have pt contact office

## 2023-11-20 NOTE — Telephone Encounter (Signed)
-----   Message from Nurse Armond Lands sent at 11/08/2023  1:43 PM EDT ----- Regarding: 7m f/u appt Alean Amen! It looks like this pt left w/o making a 3 month f/u appt with Dr. Albert Huff. Can you help schedule him please? Thanks!  Lonni Robert, RN

## 2023-11-29 ENCOUNTER — Encounter: Payer: Self-pay | Admitting: Cardiology

## 2023-12-07 ENCOUNTER — Telehealth (HOSPITAL_COMMUNITY): Payer: Self-pay | Admitting: *Deleted

## 2023-12-07 NOTE — Telephone Encounter (Signed)
 Left detailed instructions for MPI study.

## 2023-12-08 ENCOUNTER — Ambulatory Visit: Payer: Self-pay

## 2023-12-08 ENCOUNTER — Other Ambulatory Visit: Payer: Self-pay | Admitting: Cardiology

## 2023-12-08 DIAGNOSIS — R072 Precordial pain: Secondary | ICD-10-CM

## 2023-12-11 ENCOUNTER — Emergency Department (HOSPITAL_COMMUNITY)
Admission: EM | Admit: 2023-12-11 | Discharge: 2023-12-11 | Disposition: A | Discharge status: Home / Self Care | Attending: Emergency Medicine | Admitting: Emergency Medicine

## 2023-12-11 ENCOUNTER — Other Ambulatory Visit: Payer: Self-pay

## 2023-12-11 ENCOUNTER — Encounter (HOSPITAL_COMMUNITY): Payer: Self-pay | Admitting: *Deleted

## 2023-12-11 ENCOUNTER — Emergency Department (HOSPITAL_COMMUNITY)

## 2023-12-11 DIAGNOSIS — M791 Myalgia, unspecified site: Secondary | ICD-10-CM | POA: Insufficient documentation

## 2023-12-11 DIAGNOSIS — E1165 Type 2 diabetes mellitus with hyperglycemia: Secondary | ICD-10-CM | POA: Diagnosis not present

## 2023-12-11 DIAGNOSIS — Z7984 Long term (current) use of oral hypoglycemic drugs: Secondary | ICD-10-CM | POA: Diagnosis not present

## 2023-12-11 DIAGNOSIS — R739 Hyperglycemia, unspecified: Secondary | ICD-10-CM

## 2023-12-11 DIAGNOSIS — M2550 Pain in unspecified joint: Secondary | ICD-10-CM | POA: Diagnosis present

## 2023-12-11 LAB — URINALYSIS, ROUTINE W REFLEX MICROSCOPIC
Bacteria, UA: NONE SEEN
Bilirubin Urine: NEGATIVE
Glucose, UA: >=500 mg/dL — AB
Hgb urine dipstick: NEGATIVE
Ketones, ur: 20 mg/dL — AB
Leukocytes,Ua: NEGATIVE
Nitrite: NEGATIVE
Protein, ur: NEGATIVE mg/dL
Specific Gravity, Urine: 1.035 — ABNORMAL HIGH (ref 1.005–1.030)
pH: 6 (ref 5.0–8.0)

## 2023-12-11 LAB — CBC WITH DIFFERENTIAL/PLATELET
Abs Immature Granulocytes: 0.01 10*3/uL (ref 0.00–0.07)
Basophils Absolute: 0 10*3/uL (ref 0.0–0.1)
Basophils Relative: 0 %
Eosinophils Absolute: 0.1 10*3/uL (ref 0.0–0.5)
Eosinophils Relative: 1 %
HCT: 43.3 % (ref 39.0–52.0)
Hemoglobin: 14.1 g/dL (ref 13.0–17.0)
Immature Granulocytes: 0 %
Lymphocytes Relative: 36 %
Lymphs Abs: 1.8 10*3/uL (ref 0.7–4.0)
MCH: 27.2 pg (ref 26.0–34.0)
MCHC: 32.6 g/dL (ref 30.0–36.0)
MCV: 83.6 fL (ref 80.0–100.0)
Monocytes Absolute: 0.5 10*3/uL (ref 0.1–1.0)
Monocytes Relative: 11 %
Neutro Abs: 2.6 10*3/uL (ref 1.7–7.7)
Neutrophils Relative %: 52 %
Platelets: 254 10*3/uL (ref 150–400)
RBC: 5.18 MIL/uL (ref 4.22–5.81)
RDW: 12.4 % (ref 11.5–15.5)
WBC: 5 10*3/uL (ref 4.0–10.5)
nRBC: 0 % (ref 0.0–0.2)

## 2023-12-11 LAB — COMPREHENSIVE METABOLIC PANEL WITH GFR
ALT: 19 U/L (ref 0–44)
AST: 17 U/L (ref 15–41)
Albumin: 3.3 g/dL — ABNORMAL LOW (ref 3.5–5.0)
Alkaline Phosphatase: 68 U/L (ref 38–126)
Anion gap: 9 (ref 5–15)
BUN: 14 mg/dL (ref 8–23)
CO2: 25 mmol/L (ref 22–32)
Calcium: 9 mg/dL (ref 8.9–10.3)
Chloride: 103 mmol/L (ref 98–111)
Creatinine, Ser: 0.71 mg/dL (ref 0.61–1.24)
GFR, Estimated: >60 mL/min (ref >60)
Glucose, Bld: 329 mg/dL — ABNORMAL HIGH (ref 70–99)
Potassium: 4.1 mmol/L (ref 3.5–5.1)
Sodium: 137 mmol/L (ref 135–145)
Total Bilirubin: 1 mg/dL (ref 0.0–1.2)
Total Protein: 6.8 g/dL (ref 6.5–8.1)

## 2023-12-11 MED ORDER — ONDANSETRON 4 MG PO TBDP: 4.0000 mg | ORAL_TABLET | Freq: Three times a day (TID) | ORAL | 0 refills | Status: AC | PRN

## 2023-12-11 MED ORDER — METFORMIN HCL 500 MG PO TABS: 1000.0000 mg | ORAL_TABLET | Freq: Two times a day (BID) | ORAL | 0 refills | Status: DC

## 2023-12-11 NOTE — Discharge Instructions (Signed)
 Follow-up with your primary doctor about your muscle aches and joint pains.  Please also follow-up with orthopedic surgery about your knee pain.  Return to the emergency department if you develop fevers or any other concerning symptoms.

## 2023-12-11 NOTE — ED Provider Notes (Signed)
 [name] EMERGENCY DEPARTMENT AT [facility] Provider Note   CSN: [id number] Arrival date & time: 12/11/23  1037     History  Chief Complaint  Patient presents with   Generalized Body Aches    Samuel [name] is a 68 y.o. male.  68 year old male with a history of diabetes who is from [place] who presents emergency department with bodyaches and knee pain.  Patient reports that for the past few weeks has been having intermittent body ache knee pain.  Does report this happened years ago as well.  Was treated with medication but cannot recall what it is.  Does not any swelling of his knees.  No fevers.  No chills.  No recent international travel.  No history of knee surgeries.       Home Medications Prior to Admission medications   Medication Sig Start Date End Date Taking? Authorizing Provider  ondansetron  (ZOFRAN -ODT) 4 MG disintegrating tablet Take 1 tablet (4 mg total) by mouth every 8 (eight) hours as needed for nausea or vomiting. 12/11/23  Yes Ninetta Basket, MD  atorvastatin  (LIPITOR) 20 MG tablet Take 1 tablet (20 mg total) by mouth daily. 11/16/23 02/14/24  Tolia, Sunit, DO  metFORMIN  (GLUCOPHAGE ) 500 MG tablet Take 2 tablets (1,000 mg total) by mouth 2 (two) times daily. 12/11/23 01/10/24  Ninetta Basket, MD  albuterol  (VENTOLIN  HFA) 108 [phone] Base) MCG/ACT inhaler Inhale 2 puffs into the lungs every 6 (six) hours as needed for wheezing or shortness of breath. Patient not taking: Reported on 09/30/2020 08/10/20 09/30/20  Layden, Lindsey A, PA-C  pantoprazole  (PROTONIX ) 20 MG tablet Take 1 tablet (20 mg total) by mouth daily. Patient not taking: Reported on 09/30/2020 08/10/20 09/30/20  Layden, Lindsey A, PA-C      Allergies    Patient has no known allergies.    Review of Systems   Review of Systems  Physical Exam Updated Vital Signs BP 112/70 (BP Location: Right Arm)   Pulse 91   Temp 98.5 F (36.9 C)   Resp 16   Ht 6\' 1"  (1.854 m)   Wt 81.3 kg   SpO2 98%   BMI  23.65 kg/m  Physical Exam Vitals and nursing note reviewed.  Constitutional:      General: He is not in acute distress.    Appearance: He is well-developed.  HENT:     Head: Normocephalic and atraumatic.     Right Ear: External ear normal.     Left Ear: External ear normal.     Nose: Nose normal.  Eyes:     Extraocular Movements: Extraocular movements intact.     Conjunctiva/sclera: Conjunctivae normal.     Pupils: Pupils are equal, round, and reactive to light.  Cardiovascular:     Rate and Rhythm: Normal rate and regular rhythm.     Heart sounds: Normal heart sounds.  Pulmonary:     Effort: Pulmonary effort is normal. No respiratory distress.     Breath sounds: Normal breath sounds.  Musculoskeletal:     Cervical back: Normal range of motion and neck supple.     Right lower leg: No edema.     Left lower leg: No edema.     Comments: No knee effusion.  Full range of motion of the left and right knees.  Skin:    General: Skin is warm and dry.  Neurological:     Mental Status: He is alert. Mental status is at baseline.  Psychiatric:  Mood and Affect: Mood normal.        Behavior: Behavior normal.     ED Results / Procedures / Treatments   Labs (all labs ordered are listed, but only abnormal results are displayed) Labs Reviewed  COMPREHENSIVE METABOLIC PANEL WITH GFR - Abnormal; Notable for the following components:      Result Value   Glucose, Bld 329 (*)    Albumin 3.3 (*)    All other components within normal limits  URINALYSIS, ROUTINE W REFLEX MICROSCOPIC - Abnormal; Notable for the following components:   Specific Gravity, Urine 1.035 (*)    Glucose, UA >=500 (*)    Ketones, ur 20 (*)    All other components within normal limits  CBC WITH DIFFERENTIAL/PLATELET    EKG None  Radiology DG Chest 2 View Result Date: 12/11/2023 CLINICAL DATA:  Generalized body aches. EXAM: CHEST - 2 VIEW COMPARISON:  Chest radiograph dated 11/13/2019. FINDINGS: The heart  size and mediastinal contours are within normal limits. Both lungs are clear. The visualized skeletal structures are unremarkable. IMPRESSION: No active cardiopulmonary disease. Electronically Signed   By: Angus Bark M.D.   On: 12/11/2023 13:31    Procedures Procedures    Medications Ordered in ED Medications - No data to display  ED Course/ Medical Decision Making/ A&P                                 Medical Decision Making Amount and/or Complexity of Data Reviewed Labs: ordered. Radiology: ordered.  Risk Prescription drug management.   Samuel [name] is a 69 y.o. male with comorbidities that complicate the patient evaluation including diabetes presents emergency department body aches knee pain  Initial Ddx:  Myalgias, arthralgias, viral illness, inflammatory arthritis, osteoarthritis  MDM/Course:  Patient presents emergency department with diffuse arthralgias and myalgias.  No fevers.  No recent travel.  No specific infectious symptoms.  Is complaining of bilateral knee pain which appears to be chronic.  No joint effusions.  Still has full range of motion.  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  Will have him follow-up with orthopedics regarding this with his primary doctor regarding his generalized infectious symptoms.  Did have lab work and chest x-ray that did not show any acute findings.  Does show hyperglycemia with a blood sugar of 329 so we will increase his metformin  from 500 twice daily to 1000 twice daily.  Was given Zofran  in case he has any GI side effects.    This patient presents to the ED for concern of complaints listed in HPI, this involves an extensive number of treatment options, and is a complaint that carries with it a high risk of complications and morbidity. Disposition including potential need for admission considered.   Dispo: DC Home. Return precautions discussed including, but not limited to, those listed in the AVS. Allowed pt time to ask  questions which were answered fully prior to dc.  Records reviewed Outpatient Clinic Notes The following labs were independently interpreted: Chemistry and show Hyperglycemia I independently reviewed the following imaging with scope of interpretation limited to determining acute life threatening conditions related to emergency care: Chest x-ray and agree with the radiologist interpretation with the following exceptions: none I have reviewed the patients home medications and made adjustments as needed Social Determinants of health:  Geriatric  Portions of this note were generated with Scientist, clinical (histocompatibility and immunogenetics). Dictation errors may occur despite best attempts  at proofreading.     Final Clinical Impression(s) / ED Diagnoses Final diagnoses:  Hyperglycemia  Arthralgia, unspecified joint    Rx / DC Orders ED Discharge Orders          Ordered    metFORMIN  (GLUCOPHAGE ) 500 MG tablet  2 times daily        12/11/23 1358    ondansetron  (ZOFRAN -ODT) 4 MG disintegrating tablet  Every 8 hours PRN        12/11/23 1358              Ninetta Basket, MD 12/11/23 1616

## 2023-12-11 NOTE — ED Triage Notes (Signed)
 C/o generalized bodyache onset this am, states it comes and goes.

## 2023-12-14 ENCOUNTER — Ambulatory Visit (HOSPITAL_COMMUNITY): Attending: Cardiovascular Disease

## 2023-12-19 ENCOUNTER — Encounter (HOSPITAL_COMMUNITY): Payer: Self-pay | Admitting: Cardiology

## 2024-01-20 ENCOUNTER — Encounter (HOSPITAL_COMMUNITY): Payer: Self-pay | Admitting: *Deleted

## 2024-01-20 ENCOUNTER — Other Ambulatory Visit: Payer: Self-pay

## 2024-01-20 ENCOUNTER — Emergency Department (HOSPITAL_COMMUNITY)
Admission: EM | Admit: 2024-01-20 | Discharge: 2024-01-20 | Disposition: A | Discharge status: Home / Self Care | Attending: Emergency Medicine | Admitting: Emergency Medicine

## 2024-01-20 DIAGNOSIS — E1165 Type 2 diabetes mellitus with hyperglycemia: Secondary | ICD-10-CM | POA: Diagnosis not present

## 2024-01-20 DIAGNOSIS — E782 Mixed hyperlipidemia: Secondary | ICD-10-CM

## 2024-01-20 DIAGNOSIS — Z7984 Long term (current) use of oral hypoglycemic drugs: Secondary | ICD-10-CM | POA: Diagnosis not present

## 2024-01-20 DIAGNOSIS — R739 Hyperglycemia, unspecified: Secondary | ICD-10-CM

## 2024-01-20 DIAGNOSIS — R3589 Other polyuria: Secondary | ICD-10-CM

## 2024-01-20 LAB — URINALYSIS, ROUTINE W REFLEX MICROSCOPIC
Bacteria, UA: NONE SEEN
Bilirubin Urine: NEGATIVE
Glucose, UA: >=500 mg/dL — AB
Hgb urine dipstick: NEGATIVE
Ketones, ur: NEGATIVE mg/dL
Leukocytes,Ua: NEGATIVE
Nitrite: NEGATIVE
Protein, ur: NEGATIVE mg/dL
Specific Gravity, Urine: 1.036 — ABNORMAL HIGH (ref 1.005–1.030)
pH: 5 (ref 5.0–8.0)

## 2024-01-20 LAB — CBC WITH DIFFERENTIAL/PLATELET
Abs Immature Granulocytes: 0 10*3/uL (ref 0.00–0.07)
Basophils Absolute: 0 10*3/uL (ref 0.0–0.1)
Basophils Relative: 1 %
Eosinophils Absolute: 0.1 10*3/uL (ref 0.0–0.5)
Eosinophils Relative: 1 %
HCT: 43.9 % (ref 39.0–52.0)
Hemoglobin: 14.1 g/dL (ref 13.0–17.0)
Immature Granulocytes: 0 %
Lymphocytes Relative: 42 %
Lymphs Abs: 1.7 10*3/uL (ref 0.7–4.0)
MCH: 26.9 pg (ref 26.0–34.0)
MCHC: 32.1 g/dL (ref 30.0–36.0)
MCV: 83.8 fL (ref 80.0–100.0)
Monocytes Absolute: 0.5 10*3/uL (ref 0.1–1.0)
Monocytes Relative: 13 %
Neutro Abs: 1.8 10*3/uL (ref 1.7–7.7)
Neutrophils Relative %: 43 %
Platelets: 259 10*3/uL (ref 150–400)
RBC: 5.24 MIL/uL (ref 4.22–5.81)
RDW: 12.4 % (ref 11.5–15.5)
WBC: 4.1 10*3/uL (ref 4.0–10.5)
nRBC: 0 % (ref 0.0–0.2)

## 2024-01-20 LAB — COMPREHENSIVE METABOLIC PANEL WITH GFR
ALT: 15 U/L (ref 0–44)
AST: 17 U/L (ref 15–41)
Albumin: 3.2 g/dL — ABNORMAL LOW (ref 3.5–5.0)
Alkaline Phosphatase: 61 U/L (ref 38–126)
Anion gap: 8 (ref 5–15)
BUN: 12 mg/dL (ref 8–23)
CO2: 26 mmol/L (ref 22–32)
Calcium: 9.1 mg/dL (ref 8.9–10.3)
Chloride: 101 mmol/L (ref 98–111)
Creatinine, Ser: 0.85 mg/dL (ref 0.61–1.24)
GFR, Estimated: >60 mL/min (ref >60)
Glucose, Bld: 447 mg/dL — ABNORMAL HIGH (ref 70–99)
Potassium: 4.4 mmol/L (ref 3.5–5.1)
Sodium: 135 mmol/L (ref 135–145)
Total Bilirubin: 0.7 mg/dL (ref 0.0–1.2)
Total Protein: 5.3 g/dL — ABNORMAL LOW (ref 6.5–8.1)

## 2024-01-20 LAB — CBG MONITORING, ED: Glucose-Capillary: 401 mg/dL — ABNORMAL HIGH (ref 70–99)

## 2024-01-20 LAB — TSH: TSH: 0.494 u[IU]/mL (ref 0.350–4.500)

## 2024-01-20 MED ORDER — EMPAGLIFLOZIN 10 MG PO TABS: 10.0000 mg | ORAL_TABLET | Freq: Every day | ORAL | 0 refills | Status: AC

## 2024-01-20 MED ORDER — METFORMIN HCL 500 MG PO TABS: 1000.0000 mg | ORAL_TABLET | Freq: Two times a day (BID) | ORAL | 0 refills | Status: AC

## 2024-01-20 MED ORDER — EMPAGLIFLOZIN 10 MG PO TABS
10.0000 mg | ORAL_TABLET | Freq: Once | ORAL | Status: AC
  Administered 2024-01-20: 10 mg via ORAL
  Filled 2024-01-20: qty 1

## 2024-01-20 MED ORDER — ATORVASTATIN CALCIUM 20 MG PO TABS: 20.0000 mg | ORAL_TABLET | Freq: Every day | ORAL | 0 refills | Status: AC

## 2024-01-20 NOTE — ED Triage Notes (Signed)
 The pt is c/o total body pain  head arms legs fingers  abd  and lower both legs  strange taste in his mouth

## 2024-01-20 NOTE — ED Notes (Signed)
 Pt in bed, pt states that he has joint soreness for the past week and urinary urgency.  Resps even and unlabored, md at bedside.

## 2024-01-20 NOTE — ED Provider Notes (Signed)
 [place] EMERGENCY DEPARTMENT AT [facility] Provider Note   CSN: [id number] Arrival date & time: 01/20/24  1511     Patient presents with: total body soreness   Samuel [name] is a 68 y.o. male.   HPI Presents with polyuria and soreness.  Onset about a week ago.  He notes that he has a history of diabetes, thyroid  dysfunction, changed antihyperglycemic sometime ago, but ran out of them about a week ago.  No vomiting, no fever, no chills.    Prior to Admission medications   Medication Sig Start Date End Date Taking? Authorizing Provider  empagliflozin  (JARDIANCE ) 10 MG TABS tablet Take 1 tablet (10 mg total) by mouth daily before breakfast. 01/20/24  Yes Garrick Charleston, MD  atorvastatin  (LIPITOR) 20 MG tablet Take 1 tablet (20 mg total) by mouth daily. 01/20/24 02/19/24  Garrick Charleston, MD  metFORMIN  (GLUCOPHAGE ) 500 MG tablet Take 2 tablets (1,000 mg total) by mouth 2 (two) times daily. 01/20/24 02/19/24  Garrick Charleston, MD  ondansetron  (ZOFRAN -ODT) 4 MG disintegrating tablet Take 1 tablet (4 mg total) by mouth every 8 (eight) hours as needed for nausea or vomiting. 12/11/23   Yolande Charleston BROCKS, MD  albuterol  (VENTOLIN  HFA) 108 (90 Base) MCG/ACT inhaler Inhale 2 puffs into the lungs every 6 (six) hours as needed for wheezing or shortness of breath. Patient not taking: Reported on 09/30/2020 08/10/20 09/30/20  Layden, Lindsey A, PA-C  pantoprazole  (PROTONIX ) 20 MG tablet Take 1 tablet (20 mg total) by mouth daily. Patient not taking: Reported on 09/30/2020 08/10/20 09/30/20  Layden, Lindsey A, PA-C    Allergies: Patient has no known allergies.    Review of Systems  Updated Vital Signs BP 118/75 (BP Location: Right Arm)   Pulse 85   Temp 98.4 F (36.9 C)   Resp 18   Ht 6' 1 (1.854 m)   Wt 81.3 kg   SpO2 98%   BMI 23.65 kg/m   Physical Exam Vitals and nursing note reviewed.  Constitutional:      General: He is not in acute distress.    Appearance: He is  well-developed.  HENT:     Head: Normocephalic and atraumatic.   Eyes:     Conjunctiva/sclera: Conjunctivae normal.    Cardiovascular:     Rate and Rhythm: Normal rate and regular rhythm.  Pulmonary:     Effort: Pulmonary effort is normal. No respiratory distress.     Breath sounds: No stridor.  Abdominal:     General: There is no distension.   Skin:    General: Skin is warm and dry.   Neurological:     General: No focal deficit present.     Mental Status: He is alert and oriented to person, place, and time.     Cranial Nerves: No cranial nerve deficit.     (all labs ordered are listed, but only abnormal results are displayed) Labs Reviewed  COMPREHENSIVE METABOLIC PANEL WITH GFR - Abnormal; Notable for the following components:      Result Value   Glucose, Bld 447 (*)    Total Protein 5.3 (*)    Albumin 3.2 (*)    All other components within normal limits  URINALYSIS, ROUTINE W REFLEX MICROSCOPIC - Abnormal; Notable for the following components:   Color, Urine STRAW (*)    Specific Gravity, Urine 1.036 (*)    Glucose, UA >=500 (*)    All other components within normal limits  CBG MONITORING, ED - Abnormal; Notable for  the following components:   Glucose-Capillary 401 (*)    All other components within normal limits  CBC WITH DIFFERENTIAL/PLATELET  TSH    EKG: None  Radiology: No results found.   Procedures   Medications Ordered in the ED  empagliflozin  (JARDIANCE ) tablet 10 mg (10 mg Oral Given 01/20/24 1859)                                    Medical Decision Making Patient presents with polyuria, incontinence of known diabetes, after nursing run out of his medication.  Patient has prior somewhat similar presentations, previous diagnosis of hyperglycemia, here concern for complications of diabetes, thyroid  dysfunction, infection, less likely mass, given the absence of focal neurodeficits.  Patient's abdomen is soft, nontender, low suspicion for acute  urinary retention.  Amount and/or Complexity of Data Reviewed External Data Reviewed: notes.    Details: Prior evaluation with increase of metformin  noted Labs:  Decision-making details documented in ED Course.  Risk Prescription drug management. Decision regarding hospitalization. Diagnosis or treatment significantly limited by social determinants of health.   Update: Patient's glucose 400, no evidence for DKA, nonketotic hyperosmolar state, electrolytes fine, otherwise, vitals unremarkable, suspicion for his running out of his metformin  contributing to hyperglycemia, though with trending increase in his glucose levels patient will start second agent.  He notes he can follow-up with his physician in the next 4 to 5 days.  With notes for complicated diabetic state, patient will start empagliflozin  as well, continue metformin , follow-up with primary care.     Final diagnoses:  Hyperglycemia  Polyuria    ED Discharge Orders          Ordered    empagliflozin  (JARDIANCE ) 10 MG TABS tablet  Daily before breakfast        01/20/24 1914    metFORMIN  (GLUCOPHAGE ) 500 MG tablet  2 times daily        01/20/24 1914    atorvastatin  (LIPITOR) 20 MG tablet  Daily        01/20/24 1914               Garrick Charleston, MD 01/20/24 1914

## 2024-01-20 NOTE — Discharge Instructions (Signed)
 It is very important to take all medication as prescribed.  You have a new prescription for your metformin  and are starting 1 additional medication for diabetes.  Please take these and discuss this with your physician on follow-up this week.  Return here for concerning changes in your condition.

## 2024-01-20 NOTE — ED Provider Triage Note (Signed)
 Emergency Medicine Provider Triage Evaluation Note  Samuel [name] , a 68 y.o. male  was evaluated in triage.  Pt complains of generalized myalgias, weakness. Symptoms x 1 week. He is concerned about a goiter he has never had evaluated. Also ran out of his metformin , last sugar he checked was 400 a few days ago. States he feels generally unwell. No fevers or chills, chest pain, shortness of breath. No nausea, vomiting, diarrhea, or abdominal pain  Review of Systems  Positive:  Negative:   Physical Exam  BP 118/75 (BP Location: Right Arm)   Pulse 85   Temp 98.4 F (36.9 C)   Resp 18   Ht 6' 1 (1.854 m)   Wt 81.3 kg   SpO2 98%   BMI 23.65 kg/m  Gen:   Awake, no distress   Resp:  Normal effort  MSK:   Moves extremities without difficulty  Other:    Medical Decision Making  Medically screening exam initiated at 3:52 PM.  Appropriate orders placed.  Sargon Scouten was informed that the remainder of the evaluation will be completed by another provider, this initial triage assessment does not replace that evaluation, and the importance of remaining in the ED until their evaluation is complete.     Nora Lauraine LABOR, PA-C 01/20/24 1554

## 2024-03-18 ENCOUNTER — Other Ambulatory Visit: Payer: Self-pay

## 2024-03-18 ENCOUNTER — Encounter (HOSPITAL_COMMUNITY): Payer: Self-pay | Admitting: Emergency Medicine

## 2024-03-18 ENCOUNTER — Emergency Department (HOSPITAL_COMMUNITY)
Admission: EM | Admit: 2024-03-18 | Discharge: 2024-03-18 | Disposition: A | Discharge status: Home / Self Care | Attending: Emergency Medicine | Admitting: Emergency Medicine

## 2024-03-18 DIAGNOSIS — R739 Hyperglycemia, unspecified: Secondary | ICD-10-CM

## 2024-03-18 DIAGNOSIS — Z7984 Long term (current) use of oral hypoglycemic drugs: Secondary | ICD-10-CM | POA: Diagnosis not present

## 2024-03-18 DIAGNOSIS — E1165 Type 2 diabetes mellitus with hyperglycemia: Secondary | ICD-10-CM | POA: Diagnosis not present

## 2024-03-18 DIAGNOSIS — R202 Paresthesia of skin: Secondary | ICD-10-CM | POA: Diagnosis present

## 2024-03-18 DIAGNOSIS — R200 Anesthesia of skin: Secondary | ICD-10-CM | POA: Diagnosis not present

## 2024-03-18 LAB — CBC WITH DIFFERENTIAL/PLATELET
Abs Immature Granulocytes: 0.01 K/uL (ref 0.00–0.07)
Basophils Absolute: 0 K/uL (ref 0.0–0.1)
Basophils Relative: 1 %
Eosinophils Absolute: 0.1 K/uL (ref 0.0–0.5)
Eosinophils Relative: 1 %
HCT: 45.2 % (ref 39.0–52.0)
Hemoglobin: 14.6 g/dL (ref 13.0–17.0)
Immature Granulocytes: 0 %
Lymphocytes Relative: 32 %
Lymphs Abs: 1.4 K/uL (ref 0.7–4.0)
MCH: 26.8 pg (ref 26.0–34.0)
MCHC: 32.3 g/dL (ref 30.0–36.0)
MCV: 82.9 fL (ref 80.0–100.0)
Monocytes Absolute: 0.7 K/uL (ref 0.1–1.0)
Monocytes Relative: 16 %
Neutro Abs: 2.2 K/uL (ref 1.7–7.7)
Neutrophils Relative %: 50 %
Platelets: 223 K/uL (ref 150–400)
RBC: 5.45 MIL/uL (ref 4.22–5.81)
RDW: 12.4 % (ref 11.5–15.5)
WBC: 4.4 K/uL (ref 4.0–10.5)
nRBC: 0 % (ref 0.0–0.2)

## 2024-03-18 LAB — COMPREHENSIVE METABOLIC PANEL WITH GFR
ALT: 17 U/L (ref 0–44)
AST: 17 U/L (ref 15–41)
Albumin: 3.2 g/dL — ABNORMAL LOW (ref 3.5–5.0)
Alkaline Phosphatase: 79 U/L (ref 38–126)
Anion gap: 7 (ref 5–15)
BUN: 9 mg/dL (ref 8–23)
CO2: 26 mmol/L (ref 22–32)
Calcium: 9.2 mg/dL (ref 8.9–10.3)
Chloride: 102 mmol/L (ref 98–111)
Creatinine, Ser: 0.8 mg/dL (ref 0.61–1.24)
GFR, Estimated: >60 mL/min (ref >60)
Glucose, Bld: 318 mg/dL — ABNORMAL HIGH (ref 70–99)
Potassium: 4 mmol/L (ref 3.5–5.1)
Sodium: 135 mmol/L (ref 135–145)
Total Bilirubin: 0.9 mg/dL (ref 0.0–1.2)
Total Protein: 7.2 g/dL (ref 6.5–8.1)

## 2024-03-18 LAB — CBG MONITORING, ED
Glucose-Capillary: 335 mg/dL — ABNORMAL HIGH (ref 70–99)
Glucose-Capillary: 261 mg/dL — ABNORMAL HIGH (ref 70–99)

## 2024-03-18 LAB — URINALYSIS, ROUTINE W REFLEX MICROSCOPIC
Bacteria, UA: NONE SEEN
Bilirubin Urine: NEGATIVE
Glucose, UA: >=500 mg/dL — AB
Hgb urine dipstick: NEGATIVE
Ketones, ur: NEGATIVE mg/dL
Leukocytes,Ua: NEGATIVE
Nitrite: NEGATIVE
Protein, ur: NEGATIVE mg/dL
Specific Gravity, Urine: 1.03 (ref 1.005–1.030)
pH: 6 (ref 5.0–8.0)

## 2024-03-18 MED ORDER — SODIUM CHLORIDE 0.9 % IV BOLUS
1000.0000 mL | Freq: Once | INTRAVENOUS | Status: AC
  Administered 2024-03-18: 1000 mL via INTRAVENOUS

## 2024-03-18 NOTE — ED Triage Notes (Addendum)
 Pt is experiencing shooting pain, tingling in BL LE x 3 weeks. Endorses some lower back pain as well.

## 2024-03-18 NOTE — ED Provider Notes (Signed)
 [place] EMERGENCY DEPARTMENT AT [facility] Provider Note   CSN: [id number] Arrival date & time: 03/18/24  9054     Patient presents with: Leg pain and tingling and Back Pain   Samuel [name] is a 68 y.o. male history of diabetes, arthritis presents with complaints of bilateral lower extremity intermittent numbness and tingling.  Has been ongoing for the past 3 weeks now.  Is still able to ambulate.  Denies any urinary incontinence.  He additionally reports that he has been having some intermittent tingling in bilateral finger tips over the past couple days.  Denies any neck pain, chest pain, shortness of breath, abdominal pain.  No prior spinal surgeries.  Does report chronic right lower lumbar back pain for many years now.  No injury or trauma.  He additionally complains of bilateral knee pain.  Is primarily concerned that he cannot put his knees together when he sleeps.  Appears that he has similar presentations on multiple occasions previously including 3 months ago.      Back Pain     Past Medical History:  Diagnosis Date   Arthritis    Knees   Chest pain    Diabetes mellitus without complication (HCC)    Goiter    Multinodular Goiter with partial thyroidectomey in [place]  1998.  Ultrasound confirming diagnosis in 01/17/2014   Nausea    Past Surgical History:  Procedure Laterality Date   APPENDECTOMY  2014   THYROID  SURGERY  1998   Enlarged and painful thyroid    THYROID  SURGERY  1990     Prior to Admission medications   Medication Sig Start Date End Date Taking? Authorizing Provider  atorvastatin  (LIPITOR) 20 MG tablet Take 1 tablet (20 mg total) by mouth daily. 01/20/24 02/19/24  Garrick Charleston, MD  empagliflozin  (JARDIANCE ) 10 MG TABS tablet Take 1 tablet (10 mg total) by mouth daily before breakfast. 01/20/24   Garrick Charleston, MD  metFORMIN  (GLUCOPHAGE ) 500 MG tablet Take 2 tablets (1,000 mg total) by mouth 2 (two) times daily. 01/20/24 02/19/24  Garrick Charleston, MD  ondansetron  (ZOFRAN -ODT) 4 MG disintegrating tablet Take 1 tablet (4 mg total) by mouth every 8 (eight) hours as needed for nausea or vomiting. 12/11/23   Yolande Charleston BROCKS, MD  albuterol  (VENTOLIN  HFA) 108 [phone] Base) MCG/ACT inhaler Inhale 2 puffs into the lungs every 6 (six) hours as needed for wheezing or shortness of breath. Patient not taking: Reported on 09/30/2020 08/10/20 09/30/20  Layden, Lindsey A, PA-C  pantoprazole  (PROTONIX ) 20 MG tablet Take 1 tablet (20 mg total) by mouth daily. Patient not taking: Reported on 09/30/2020 08/10/20 09/30/20  Layden, Lindsey A, PA-C    Allergies: Patient has no known allergies.    Review of Systems  Musculoskeletal:  Positive for back pain.    Updated Vital Signs BP 107/65 (BP Location: Right Arm)   Pulse 94   Temp 98.4 F (36.9 C)   Resp 15   SpO2 98%   Physical Exam Vitals and nursing note reviewed.  Constitutional:      General: He is not in acute distress.    Appearance: He is well-developed.  HENT:     Head: Normocephalic and atraumatic.  Eyes:     Conjunctiva/sclera: Conjunctivae normal.  Cardiovascular:     Rate and Rhythm: Normal rate and regular rhythm.     Heart sounds: No murmur heard. Pulmonary:     Effort: Pulmonary effort is normal. No respiratory distress.     Breath sounds:  Normal breath sounds.  Abdominal:     Palpations: Abdomen is soft.     Tenderness: There is no abdominal tenderness.  Musculoskeletal:        General: No swelling.     Cervical back: Neck supple.  Skin:    General: Skin is warm and dry.     Capillary Refill: Capillary refill takes less than 2 seconds.  Neurological:     Mental Status: He is alert.     Comments: Patient is alert and oriented. There is no abnormal phonation. Symmetric smile without facial droop.  Moves all extremities spontaneously. 5/5 strength in upper and lower extremities. . No sensation deficit. There is no nystagmus. EOMI, PERRL. Coordination intact with finger to  nose and normal ambulation.    Psychiatric:        Mood and Affect: Mood normal.     (all labs ordered are listed, but only abnormal results are displayed) Labs Reviewed  URINALYSIS, ROUTINE W REFLEX MICROSCOPIC - Abnormal; Notable for the following components:      Result Value   Color, Urine STRAW (*)    Glucose, UA >=500 (*)    All other components within normal limits  COMPREHENSIVE METABOLIC PANEL WITH GFR - Abnormal; Notable for the following components:   Glucose, Bld 318 (*)    Albumin 3.2 (*)    All other components within normal limits  CBG MONITORING, ED - Abnormal; Notable for the following components:   Glucose-Capillary 335 (*)    All other components within normal limits  CBG MONITORING, ED - Abnormal; Notable for the following components:   Glucose-Capillary 261 (*)    All other components within normal limits  CBC WITH DIFFERENTIAL/PLATELET    EKG: None  Radiology: No results found.   Procedures   Medications Ordered in the ED  sodium chloride  0.9 % bolus 1,000 mL (1,000 mLs Intravenous New Bag/Given 03/18/24 1246)    Clinical Course as of 03/18/24 1456  Mon Mar 18, 2024  1111 Patient evaluated with complaints of acute on chronic paresthesias involving bilateral lower and upper extremities.  Not associated with any headache, vision changes or gross weakness.  Chart review demonstrates numerous presentation over the past year.  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  He is hemodynamically stable.  His exam is without any neurodeficits.  He is able to ambulate.  Given reported polyuria will obtain CBG to further evaluate for hypoglycemia as it does appear on chart review that he often presents in the setting of hyperglycemia with paresthesias. [JT]  1253 POC CBG, ED(!) Elevated to 335, will obtain routine labs and provide IV fluids [JT]  1358 CBC with Differential Unremarkable [JT]  1359  Comprehensive metabolic panel(!) Glucose 318 [JT]  1359 Urinalysis, Routine w reflex microscopic -Urine, Clean Catch(!) UA with greater than 500 glucose, negative nitrites, negative leukocyte [JT]  1450 Glucose downtrending [JT]  1454 Overall workup reassuring.  Paresthesias likely secondary to hyperglycemia.  Encouraged compliance with diabetes medications.  Patient is understanding.  Regarding chronic knee pain referred to orthopedics for further evaluation.  Patient agreement with plan. [JT]    Clinical Course User Index [JT] Donnajean Lynwood DEL, PA-C                                 Medical Decision Making Amount and/or Complexity of Data Reviewed Labs: ordered. Decision-making details  documented in ED Course.   This patient presents to the ED with chief complaint(s) of tingling .  The complaint involves an extensive differential diagnosis and also carries with it a high risk of complications and morbidity.   Pertinent past medical history as listed in HPI  The differential diagnosis includes  History and exam not consistent with CVA/TIA.  He has no gross weakness to suggest Guillain-Barr or bulbar symptoms to suggest myasthenia gravis.  He has no chest pain, shortness of breath or abdominal pain to suggest ACS or aortic dissection.  Given the timeline of his symptoms however lower suspicion for cauda equina.  He has no risk factors for epidural abscess.  No trauma to be concern for spinal fracture.    Additional history obtained: Records reviewed Care Everywhere/External Records  Disposition:   Patient will be discharged home. The patient has been appropriately medically screened and/or stabilized in the ED. I have low suspicion for any other emergent medical condition which would require further screening, evaluation or treatment in the ED or require inpatient management. At time of discharge the patient is hemodynamically stable and in no acute distress. I have discussed work-up  results and diagnosis with patient and answered all questions. Patient is agreeable with discharge plan. We discussed strict return precautions for returning to the emergency department and they verbalized understanding.     Social Determinants of Health:   none  This note was dictated with voice recognition software.  Despite best efforts at proofreading, errors may have occurred which can change the documentation meaning.       Final diagnoses:  Paresthesias  Hyperglycemia    ED Discharge Orders     None          Donnajean Lynwood DEL, PA-C 03/18/24 1456    Pamella Ozell LABOR, DO 03/20/24 [phone]

## 2024-03-18 NOTE — Discharge Instructions (Signed)
 You were evaluated in the emergency room for paresthesias.  You were additionally found to have elevated blood glucose.  Please take your diabetes medications as prescribed.

## 2024-04-12 ENCOUNTER — Encounter: Payer: Self-pay | Admitting: Cardiology

## 2024-07-03 ENCOUNTER — Emergency Department (HOSPITAL_COMMUNITY)
Admission: EM | Admit: 2024-07-03 | Discharge: 2024-07-03 | Disposition: A | Discharge status: Home / Self Care | Attending: Emergency Medicine | Admitting: Emergency Medicine

## 2024-07-03 ENCOUNTER — Other Ambulatory Visit: Payer: Self-pay

## 2024-07-03 DIAGNOSIS — R200 Anesthesia of skin: Secondary | ICD-10-CM | POA: Diagnosis present

## 2024-07-03 DIAGNOSIS — Z7984 Long term (current) use of oral hypoglycemic drugs: Secondary | ICD-10-CM | POA: Diagnosis not present

## 2024-07-03 DIAGNOSIS — G629 Polyneuropathy, unspecified: Secondary | ICD-10-CM | POA: Diagnosis not present

## 2024-07-03 DIAGNOSIS — R3915 Urgency of urination: Secondary | ICD-10-CM | POA: Insufficient documentation

## 2024-07-03 DIAGNOSIS — E119 Type 2 diabetes mellitus without complications: Secondary | ICD-10-CM | POA: Insufficient documentation

## 2024-07-03 LAB — CBC WITH DIFFERENTIAL/PLATELET
Abs Immature Granulocytes: 0.01 K/uL (ref 0.00–0.07)
Basophils Absolute: 0 K/uL (ref 0.0–0.1)
Basophils Relative: 0 %
Eosinophils Absolute: 0.1 K/uL (ref 0.0–0.5)
Eosinophils Relative: 1 %
HCT: 42.4 % (ref 39.0–52.0)
Hemoglobin: 13.9 g/dL (ref 13.0–17.0)
Immature Granulocytes: 0 %
Lymphocytes Relative: 37 %
Lymphs Abs: 1.8 K/uL (ref 0.7–4.0)
MCH: 27.3 pg (ref 26.0–34.0)
MCHC: 32.8 g/dL (ref 30.0–36.0)
MCV: 83.3 fL (ref 80.0–100.0)
Monocytes Absolute: 0.5 K/uL (ref 0.1–1.0)
Monocytes Relative: 10 %
Neutro Abs: 2.6 K/uL (ref 1.7–7.7)
Neutrophils Relative %: 52 %
Platelets: 251 K/uL (ref 150–400)
RBC: 5.09 MIL/uL (ref 4.22–5.81)
RDW: 12.8 % (ref 11.5–15.5)
WBC: 5 K/uL (ref 4.0–10.5)
nRBC: 0 % (ref 0.0–0.2)

## 2024-07-03 LAB — BASIC METABOLIC PANEL WITH GFR
Anion gap: 12 (ref 5–15)
BUN: 16 mg/dL (ref 8–23)
CO2: 22 mmol/L (ref 22–32)
Calcium: 9.2 mg/dL (ref 8.9–10.3)
Chloride: 105 mmol/L (ref 98–111)
Creatinine, Ser: 0.7 mg/dL (ref 0.61–1.24)
GFR, Estimated: >60 mL/min (ref >60)
Glucose, Bld: 298 mg/dL — ABNORMAL HIGH (ref 70–99)
Potassium: 4.1 mmol/L (ref 3.5–5.1)
Sodium: 139 mmol/L (ref 135–145)

## 2024-07-03 LAB — URINALYSIS, MICROSCOPIC (REFLEX): Bacteria, UA: NONE SEEN

## 2024-07-03 LAB — URINALYSIS, ROUTINE W REFLEX MICROSCOPIC
Bilirubin Urine: NEGATIVE
Glucose, UA: >=500 mg/dL — AB
Hgb urine dipstick: NEGATIVE
Ketones, ur: NEGATIVE mg/dL
Leukocytes,Ua: NEGATIVE
Nitrite: NEGATIVE
Protein, ur: NEGATIVE mg/dL
Specific Gravity, Urine: 1.015 (ref 1.005–1.030)
pH: 5.5 (ref 5.0–8.0)

## 2024-07-03 LAB — MAGNESIUM: Magnesium: 1.6 mg/dL — ABNORMAL LOW (ref 1.7–2.4)

## 2024-07-03 LAB — BRAIN NATRIURETIC PEPTIDE: B Natriuretic Peptide: 34.4 pg/mL (ref 0.0–100.0)

## 2024-07-03 MED ORDER — GABAPENTIN 100 MG PO CAPS: 100.0000 mg | ORAL_CAPSULE | Freq: Three times a day (TID) | ORAL | 3 refills | Status: AC

## 2024-07-03 MED ORDER — GABAPENTIN 100 MG PO CAPS
100.0000 mg | ORAL_CAPSULE | Freq: Once | ORAL | Status: AC
  Administered 2024-07-03: 100 mg via ORAL
  Filled 2024-07-03: qty 1

## 2024-07-03 MED ORDER — MAGNESIUM OXIDE -MG SUPPLEMENT 400 (240 MG) MG PO TABS
800.0000 mg | ORAL_TABLET | Freq: Once | ORAL | Status: AC
  Administered 2024-07-03: 800 mg via ORAL
  Filled 2024-07-03: qty 2

## 2024-07-03 MED ORDER — TAMSULOSIN HCL 0.4 MG PO CAPS: 0.4000 mg | ORAL_CAPSULE | Freq: Every day | ORAL | 2 refills | Status: AC

## 2024-07-03 NOTE — ED Notes (Signed)
 Patient dc by RN. Ambulatory to lobby without additional questions for RN.

## 2024-07-03 NOTE — ED Provider Notes (Signed)
 [place] EMERGENCY DEPARTMENT AT [facility] Provider Note   CSN: [id number] Arrival date & time: 07/03/24  1041     Patient presents with: Numbness   Samuel [name] is a 68 y.o. male.   HPI Patient presents for distal extremity numbness.  Medical history includes DM, arthritis.  For the past 2 to 3 weeks, patient has noticed some numbness sensation in his bilateral feet and hands.  Symptoms seem to come and go.  He has not had any areas of weakness.  He has had an intermittent pruritic rash on the back of his neck.  He feels like his symptoms worsened after he drinks powdered milk or eats oranges.  He has been trying to get seen by his primary care doctor about has been unable to get timely scheduling.  He does continue to take oral medication for his diabetes.    Prior to Admission medications   Medication Sig Start Date End Date Taking? Authorizing Provider  gabapentin  (NEURONTIN ) 100 MG capsule Take 1 capsule (100 mg total) by mouth 3 (three) times daily. 07/03/24  Yes Melvenia Motto, MD  tamsulosin  (FLOMAX ) 0.4 MG CAPS capsule Take 1 capsule (0.4 mg total) by mouth daily. 07/03/24 10/01/24 Yes Melvenia Motto, MD  atorvastatin  (LIPITOR) 20 MG tablet Take 1 tablet (20 mg total) by mouth daily. 01/20/24 02/19/24  Garrick Charleston, MD  empagliflozin  (JARDIANCE ) 10 MG TABS tablet Take 1 tablet (10 mg total) by mouth daily before breakfast. 01/20/24   Garrick Charleston, MD  metFORMIN  (GLUCOPHAGE ) 500 MG tablet Take 2 tablets (1,000 mg total) by mouth 2 (two) times daily. 01/20/24 02/19/24  Garrick Charleston, MD  ondansetron  (ZOFRAN -ODT) 4 MG disintegrating tablet Take 1 tablet (4 mg total) by mouth every 8 (eight) hours as needed for nausea or vomiting. 12/11/23   Yolande Charleston BROCKS, MD  albuterol  (VENTOLIN  HFA) [phone] Base) MCG/ACT inhaler Inhale 2 puffs into the lungs every 6 (six) hours as needed for wheezing or shortness of breath. Patient not taking: Reported on 09/30/2020 08/10/20 09/30/20   Layden, Lindsey A, PA-C  pantoprazole  (PROTONIX ) 20 MG tablet Take 1 tablet (20 mg total) by mouth daily. Patient not taking: Reported on 09/30/2020 08/10/20 09/30/20  Layden, Lindsey A, PA-C    Allergies: Patient has no known allergies.    Review of Systems  Skin:  Positive for rash.  Neurological:  Positive for numbness.  All other systems reviewed and are negative.   Updated Vital Signs BP 117/74   Pulse 91   Temp 98 F (36.7 C)   Resp 16   SpO2 100%   Physical Exam Vitals and nursing note reviewed.  Constitutional:      General: He is not in acute distress.    Appearance: Normal appearance. He is well-developed. He is not ill-appearing, toxic-appearing or diaphoretic.  HENT:     Head: Normocephalic and atraumatic.     Right Ear: External ear normal.     Left Ear: External ear normal.     Nose: Nose normal.     Mouth/Throat:     Mouth: Mucous membranes are moist.  Eyes:     Extraocular Movements: Extraocular movements intact.     Conjunctiva/sclera: Conjunctivae normal.  Cardiovascular:     Rate and Rhythm: Normal rate and regular rhythm.     Pulses: Normal pulses.     Heart sounds: No murmur heard. Pulmonary:     Effort: Pulmonary effort is normal. No respiratory distress.  Abdominal:  General: There is no distension.     Palpations: Abdomen is soft.     Tenderness: There is no abdominal tenderness.  Musculoskeletal:        General: No swelling. Normal range of motion.     Cervical back: Normal range of motion and neck supple.     Right lower leg: No edema.     Left lower leg: No edema.  Skin:    General: Skin is warm and dry.     Capillary Refill: Capillary refill takes less than 2 seconds.     Coloration: Skin is not jaundiced or pale.  Neurological:     General: No focal deficit present.     Mental Status: He is alert and oriented to person, place, and time.     Cranial Nerves: No cranial nerve deficit.     Sensory: No sensory deficit.     Motor: No  weakness.     Coordination: Coordination normal.  Psychiatric:        Mood and Affect: Mood normal.        Behavior: Behavior normal.     (all labs ordered are listed, but only abnormal results are displayed) Labs Reviewed  URINALYSIS, ROUTINE W REFLEX MICROSCOPIC - Abnormal; Notable for the following components:      Result Value   Glucose, UA >=500 (*)    All other components within normal limits  BASIC METABOLIC PANEL WITH GFR - Abnormal; Notable for the following components:   Glucose, Bld 298 (*)    All other components within normal limits  MAGNESIUM  - Abnormal; Notable for the following components:   Magnesium  1.6 (*)    All other components within normal limits  CBC WITH DIFFERENTIAL/PLATELET  BRAIN NATRIURETIC PEPTIDE  URINALYSIS, MICROSCOPIC (REFLEX)    EKG: None  Radiology: No results found.   Procedures   Medications Ordered in the ED  gabapentin  (NEURONTIN ) capsule 100 mg (100 mg Oral Given 07/03/24 1635)  magnesium  oxide (MAG-OX) tablet 800 mg (800 mg Oral Given 07/03/24 1739)                                    Medical Decision Making Amount and/or Complexity of Data Reviewed Labs: ordered.  Risk OTC drugs. Prescription drug management.   Patient presenting for intermittent numbness to bilateral feet and hands.  This has been equal bilaterally.  On arrival in the ED, his vital signs are normal.  He is well-appearing on exam.  Distal pulses are intact.  Distal extremities are warm and well-perfused.  He has no areas of swelling or erythema.  He has no diminished sensation on exam.  He has good strength throughout all extremities.  I suspect patient has developed some symptoms of neuropathy.  He does have a history of diabetes.  Will trial on gabapentin .  Patient additionally endorses an intermittent rash on the back of his neck.  This is not appreciable on exam.  He has had urinary frequency lately.  Urinalysis does not show evidence of infection.  Will  trial him on tamsulosin .  Other lab work is notable for hypomagnesemia.  Replacement magnesium  was ordered in the ED.  Patient is stable for discharge.     Final diagnoses:  Urinary urgency  Neuropathy    ED Discharge Orders          Ordered    gabapentin  (NEURONTIN ) 100 MG capsule  3 times daily  07/03/24 1755    tamsulosin  (FLOMAX ) 0.4 MG CAPS capsule  Daily        07/03/24 1755               Melvenia Motto, MD 07/03/24 1756

## 2024-07-03 NOTE — Discharge Instructions (Addendum)
 Prescriptions for any medications were sent to your pharmacy.  Gabapentin  is a medication that can help with your hand and feet numbness.  If you do not notice any improvement, you can go up on your dosing.  This should be done in coordination with your primary care doctor.  Tamsulosin  is a medication to help with your urine symptoms.  Take daily.  Return to the emergency department for any new or worsening symptoms of concern.

## 2024-07-03 NOTE — ED Triage Notes (Signed)
 Pt. Stated, Im feel numb all over for 2 weeks. I think I have a food allergy.
